# Patient Record
Sex: Female | Born: 1950 | Race: Black or African American | Hispanic: No | Smoking: Never smoker
Health system: Southern US, Community
[De-identification: ages and names within clinical notes are randomized; demographics above are authoritative.]

## PROBLEM LIST (undated history)

## (undated) DIAGNOSIS — I10 Essential (primary) hypertension: Secondary | ICD-10-CM

## (undated) DIAGNOSIS — M4316 Spondylolisthesis, lumbar region: Secondary | ICD-10-CM

## (undated) DIAGNOSIS — E7800 Pure hypercholesterolemia, unspecified: Secondary | ICD-10-CM

## (undated) DIAGNOSIS — T4145XA Adverse effect of unspecified anesthetic, initial encounter: Secondary | ICD-10-CM

## (undated) DIAGNOSIS — T8859XA Other complications of anesthesia, initial encounter: Secondary | ICD-10-CM

## (undated) DIAGNOSIS — M1710 Unilateral primary osteoarthritis, unspecified knee: Secondary | ICD-10-CM

## (undated) DIAGNOSIS — R609 Edema, unspecified: Secondary | ICD-10-CM

## (undated) DIAGNOSIS — M179 Osteoarthritis of knee, unspecified: Secondary | ICD-10-CM

## (undated) DIAGNOSIS — E611 Iron deficiency: Secondary | ICD-10-CM

## (undated) DIAGNOSIS — L910 Hypertrophic scar: Secondary | ICD-10-CM

## (undated) DIAGNOSIS — E039 Hypothyroidism, unspecified: Secondary | ICD-10-CM

## (undated) DIAGNOSIS — E049 Nontoxic goiter, unspecified: Secondary | ICD-10-CM

## (undated) HISTORY — DX: Pure hypercholesterolemia, unspecified: E78.00

## (undated) HISTORY — DX: Edema, unspecified: R60.9

## (undated) HISTORY — DX: Unilateral primary osteoarthritis, unspecified knee: M17.10

## (undated) HISTORY — PX: COLONOSCOPY: SHX174

## (undated) HISTORY — PX: EYE SURGERY: SHX253

## (undated) HISTORY — PX: TONSILLECTOMY: SUR1361

## (undated) HISTORY — DX: Osteoarthritis of knee, unspecified: M17.9

## (undated) HISTORY — DX: Iron deficiency: E61.1

## (undated) HISTORY — DX: Hypertrophic scar: L91.0

## (undated) HISTORY — DX: Nontoxic goiter, unspecified: E04.9

---

## 1997-11-04 HISTORY — PX: THYROID SURGERY: SHX805

## 1998-03-23 ENCOUNTER — Other Ambulatory Visit: Admission: RE | Admit: 1998-03-23 | Discharge: 1998-03-23 | Payer: Self-pay | Admitting: Gynecology

## 1998-10-31 ENCOUNTER — Ambulatory Visit (HOSPITAL_COMMUNITY): Admission: RE | Admit: 1998-10-31 | Discharge: 1998-11-01 | Payer: Self-pay | Admitting: Surgery

## 1999-09-03 ENCOUNTER — Encounter: Admission: RE | Admit: 1999-09-03 | Discharge: 1999-09-03 | Payer: Self-pay | Admitting: Gynecology

## 1999-09-03 ENCOUNTER — Encounter: Payer: Self-pay | Admitting: Gynecology

## 1999-11-15 ENCOUNTER — Other Ambulatory Visit: Admission: RE | Admit: 1999-11-15 | Discharge: 1999-11-15 | Payer: Self-pay | Admitting: Gynecology

## 2000-01-23 ENCOUNTER — Other Ambulatory Visit: Admission: RE | Admit: 2000-01-23 | Discharge: 2000-01-23 | Payer: Self-pay | Admitting: Gynecology

## 2000-12-23 ENCOUNTER — Other Ambulatory Visit: Admission: RE | Admit: 2000-12-23 | Discharge: 2000-12-23 | Payer: Self-pay | Admitting: Gynecology

## 2001-01-16 ENCOUNTER — Other Ambulatory Visit: Admission: RE | Admit: 2001-01-16 | Discharge: 2001-01-16 | Payer: Self-pay | Admitting: Gynecology

## 2001-04-22 ENCOUNTER — Encounter: Admission: RE | Admit: 2001-04-22 | Discharge: 2001-04-22 | Payer: Self-pay | Admitting: Gynecology

## 2001-04-22 ENCOUNTER — Encounter: Payer: Self-pay | Admitting: Gynecology

## 2001-05-20 ENCOUNTER — Other Ambulatory Visit: Admission: RE | Admit: 2001-05-20 | Discharge: 2001-05-20 | Payer: Self-pay | Admitting: Gynecology

## 2002-01-27 ENCOUNTER — Other Ambulatory Visit: Admission: RE | Admit: 2002-01-27 | Discharge: 2002-01-27 | Payer: Self-pay | Admitting: Gynecology

## 2002-07-15 ENCOUNTER — Other Ambulatory Visit: Admission: RE | Admit: 2002-07-15 | Discharge: 2002-07-15 | Payer: Self-pay | Admitting: Gynecology

## 2003-11-18 ENCOUNTER — Encounter: Admission: RE | Admit: 2003-11-18 | Discharge: 2003-11-18 | Payer: Self-pay | Admitting: Endocrinology

## 2003-11-28 ENCOUNTER — Other Ambulatory Visit: Admission: RE | Admit: 2003-11-28 | Discharge: 2003-11-28 | Payer: Self-pay | Admitting: Gynecology

## 2004-12-31 ENCOUNTER — Encounter: Admission: RE | Admit: 2004-12-31 | Discharge: 2004-12-31 | Payer: Self-pay | Admitting: Gynecology

## 2005-03-25 ENCOUNTER — Other Ambulatory Visit: Admission: RE | Admit: 2005-03-25 | Discharge: 2005-03-25 | Payer: Self-pay | Admitting: Gynecology

## 2006-08-25 ENCOUNTER — Encounter: Admission: RE | Admit: 2006-08-25 | Discharge: 2006-08-25 | Payer: Self-pay | Admitting: Gynecology

## 2006-10-08 ENCOUNTER — Other Ambulatory Visit: Admission: RE | Admit: 2006-10-08 | Discharge: 2006-10-08 | Payer: Self-pay | Admitting: Otolaryngology

## 2006-10-15 ENCOUNTER — Other Ambulatory Visit: Admission: RE | Admit: 2006-10-15 | Discharge: 2006-10-15 | Payer: Self-pay | Admitting: Gynecology

## 2007-09-02 ENCOUNTER — Encounter: Admission: RE | Admit: 2007-09-02 | Discharge: 2007-09-02 | Payer: Self-pay | Admitting: Gynecology

## 2008-09-02 ENCOUNTER — Encounter: Admission: RE | Admit: 2008-09-02 | Discharge: 2008-09-02 | Payer: Self-pay | Admitting: Family Medicine

## 2008-09-15 ENCOUNTER — Encounter: Admission: RE | Admit: 2008-09-15 | Discharge: 2008-09-15 | Payer: Self-pay | Admitting: Family Medicine

## 2009-10-03 ENCOUNTER — Encounter: Admission: RE | Admit: 2009-10-03 | Discharge: 2009-10-03 | Payer: Self-pay | Admitting: Family Medicine

## 2010-08-07 ENCOUNTER — Encounter: Admission: RE | Admit: 2010-08-07 | Discharge: 2010-08-07 | Payer: Self-pay | Admitting: Family Medicine

## 2010-10-23 ENCOUNTER — Encounter
Admission: RE | Admit: 2010-10-23 | Discharge: 2010-10-23 | Payer: Self-pay | Source: Home / Self Care | Attending: Family Medicine | Admitting: Family Medicine

## 2010-11-25 ENCOUNTER — Encounter: Payer: Self-pay | Admitting: Family Medicine

## 2011-07-22 ENCOUNTER — Other Ambulatory Visit: Payer: Self-pay | Admitting: Family Medicine

## 2011-07-22 DIAGNOSIS — E049 Nontoxic goiter, unspecified: Secondary | ICD-10-CM

## 2011-07-24 ENCOUNTER — Ambulatory Visit
Admission: RE | Admit: 2011-07-24 | Discharge: 2011-07-24 | Disposition: A | Discharge status: Home / Self Care | Payer: BC Managed Care – PPO | Source: Ambulatory Visit | Attending: Family Medicine | Admitting: Family Medicine

## 2011-07-24 DIAGNOSIS — E049 Nontoxic goiter, unspecified: Secondary | ICD-10-CM

## 2011-10-09 ENCOUNTER — Other Ambulatory Visit: Payer: Self-pay | Admitting: Family Medicine

## 2011-10-09 DIAGNOSIS — Z1231 Encounter for screening mammogram for malignant neoplasm of breast: Secondary | ICD-10-CM

## 2011-10-31 ENCOUNTER — Ambulatory Visit: Payer: BC Managed Care – PPO

## 2012-01-02 ENCOUNTER — Other Ambulatory Visit: Payer: Self-pay | Admitting: Obstetrics and Gynecology

## 2012-01-02 ENCOUNTER — Other Ambulatory Visit (HOSPITAL_COMMUNITY)
Admission: RE | Admit: 2012-01-02 | Discharge: 2012-01-02 | Disposition: A | Discharge status: Home / Self Care | Payer: BC Managed Care – PPO | Source: Ambulatory Visit | Attending: Obstetrics and Gynecology | Admitting: Obstetrics and Gynecology

## 2012-01-02 DIAGNOSIS — Z01419 Encounter for gynecological examination (general) (routine) without abnormal findings: Secondary | ICD-10-CM | POA: Insufficient documentation

## 2012-01-09 ENCOUNTER — Encounter (HOSPITAL_COMMUNITY): Payer: Self-pay

## 2012-01-24 ENCOUNTER — Other Ambulatory Visit (HOSPITAL_COMMUNITY): Payer: BC Managed Care – PPO

## 2012-01-24 ENCOUNTER — Encounter (HOSPITAL_COMMUNITY)
Admission: RE | Admit: 2012-01-24 | Discharge: 2012-01-24 | Disposition: A | Discharge status: Home / Self Care | Payer: BC Managed Care – PPO | Source: Ambulatory Visit | Attending: Obstetrics and Gynecology | Admitting: Obstetrics and Gynecology

## 2012-01-24 ENCOUNTER — Encounter (HOSPITAL_COMMUNITY): Payer: Self-pay

## 2012-01-24 ENCOUNTER — Other Ambulatory Visit: Payer: Self-pay

## 2012-01-24 HISTORY — DX: Hypothyroidism, unspecified: E03.9

## 2012-01-24 HISTORY — DX: Other complications of anesthesia, initial encounter: T88.59XA

## 2012-01-24 HISTORY — DX: Adverse effect of unspecified anesthetic, initial encounter: T41.45XA

## 2012-01-24 HISTORY — DX: Essential (primary) hypertension: I10

## 2012-01-24 LAB — CBC
HCT: 37.7 % (ref 36.0–46.0)
Hemoglobin: 12.5 g/dL (ref 12.0–15.0)
MCHC: 33.2 g/dL (ref 30.0–36.0)
MCV: 84 fL (ref 78.0–100.0)
Platelets: 336 10*3/uL (ref 150–400)
RDW: 16 % — ABNORMAL HIGH (ref 11.5–15.5)
WBC: 7 10*3/uL (ref 4.0–10.5)

## 2012-01-24 LAB — BASIC METABOLIC PANEL
CO2: 28 mEq/L (ref 19–32)
Creatinine, Ser: 0.68 mg/dL (ref 0.50–1.10)
GFR calc Af Amer: >90 mL/min (ref >90)
Potassium: 3 mEq/L — ABNORMAL LOW (ref 3.5–5.1)
Sodium: 137 mEq/L (ref 135–145)

## 2012-01-24 NOTE — Pre-Procedure Instructions (Signed)
Low K+ result of 3.0 reported to Dr. Arby Barrette, who requested I call Dr. Richardson Dopp and have her order K-Dur 40 meq to take daily until surgery. I spoke to Annabelle Harman, Dr. Dawayne Patricia nurse, who said she'd call the order in for Dr. Richardson Dopp to pt's preferred pharmacy. I called the pt, Mrs. Cassidy Austin, and told her about her labs and the need to pick up RX from pharmacy.

## 2012-01-24 NOTE — Patient Instructions (Addendum)
YOUR PROCEDURE IS SCHEDULED ON:01/30/12  ENTER THROUGH THE MAIN ENTRANCE OF [facility] AT:0730  USE DESK PHONE AND DIAL [phone] TO INFORM us OF YOUR ARRIVAL  CALL [phone] IF YOU HAVE ANY QUESTIONS OR PROBLEMS PRIOR TO YOUR ARRIVAL.  REMEMBER: DO NOT EAT OR DRINK AFTER MIDNIGHT : Wed  SPECIAL INSTRUCTIONS:   YOU MAY BRUSH YOUR TEETH THE MORNING OF SURGERY   TAKE THESE MEDICINES THE DAY OF SURGERY WITH SIP OF WATER:Synthroid, BP med  DO NOT WEAR JEWELRY, EYE MAKEUP, LIPSTICK OR DARK FINGERNAIL POLISH DO NOT WEAR LOTIONS  DO NOT SHAVE FOR 48 HOURS PRIOR TO SURGERY  YOU WILL NOT BE ALLOWED TO DRIVE YOURSELF HOME.  NAME OF DRIVER: unknown at PAT appt

## 2012-01-24 NOTE — Pre-Procedure Instructions (Signed)
Per Dr. Arby Barrette- repeat K+ day of surgery... I entered lab request for DOS.

## 2012-01-29 ENCOUNTER — Other Ambulatory Visit: Payer: Self-pay | Admitting: Obstetrics and Gynecology

## 2012-01-30 ENCOUNTER — Encounter (HOSPITAL_COMMUNITY): Payer: Self-pay | Admitting: Anesthesiology

## 2012-01-30 ENCOUNTER — Ambulatory Visit (HOSPITAL_COMMUNITY)
Admission: RE | Admit: 2012-01-30 | Discharge: 2012-01-30 | Disposition: A | Discharge status: Home Health | Payer: BC Managed Care – PPO | Source: Ambulatory Visit | Attending: Obstetrics and Gynecology | Admitting: Obstetrics and Gynecology

## 2012-01-30 ENCOUNTER — Ambulatory Visit (HOSPITAL_COMMUNITY): Payer: BC Managed Care – PPO | Admitting: Anesthesiology

## 2012-01-30 ENCOUNTER — Encounter (HOSPITAL_COMMUNITY)
Admission: RE | Disposition: A | Discharge status: Home Health | Payer: Self-pay | Source: Ambulatory Visit | Attending: Obstetrics and Gynecology

## 2012-01-30 DIAGNOSIS — Z01818 Encounter for other preprocedural examination: Secondary | ICD-10-CM | POA: Insufficient documentation

## 2012-01-30 DIAGNOSIS — Z01812 Encounter for preprocedural laboratory examination: Secondary | ICD-10-CM | POA: Insufficient documentation

## 2012-01-30 DIAGNOSIS — N840 Polyp of corpus uteri: Secondary | ICD-10-CM

## 2012-01-30 DIAGNOSIS — N950 Postmenopausal bleeding: Secondary | ICD-10-CM | POA: Insufficient documentation

## 2012-01-30 LAB — POTASSIUM: Potassium: 4 mEq/L (ref 3.5–5.1)

## 2012-01-30 SURGERY — DILATATION & CURETTAGE/HYSTEROSCOPY WITH RESECTOCOPE
Anesthesia: General | Site: Vagina | Wound class: Clean Contaminated

## 2012-01-30 MED ORDER — DOXYCYCLINE HYCLATE 100 MG PO TABS: ORAL_TABLET | ORAL | Status: DC

## 2012-01-30 MED ORDER — PROPOFOL 10 MG/ML IV EMUL
INTRAVENOUS | Status: AC
  Filled 2012-01-30: qty 20

## 2012-01-30 MED ORDER — EPHEDRINE SULFATE 50 MG/ML IJ SOLN
INTRAMUSCULAR | Status: DC | PRN
  Administered 2012-01-30: 10 mg via INTRAVENOUS
  Administered 2012-01-30 (×2): 5 mg via INTRAVENOUS

## 2012-01-30 MED ORDER — FENTANYL CITRATE 0.05 MG/ML IJ SOLN
INTRAMUSCULAR | Status: DC | PRN
  Administered 2012-01-30 (×3): 50 ug via INTRAVENOUS

## 2012-01-30 MED ORDER — PROPOFOL 10 MG/ML IV EMUL
INTRAVENOUS | Status: DC | PRN
  Administered 2012-01-30: 150 mg via INTRAVENOUS

## 2012-01-30 MED ORDER — FENTANYL CITRATE 0.05 MG/ML IJ SOLN
INTRAMUSCULAR | Status: AC
  Filled 2012-01-30: qty 4

## 2012-01-30 MED ORDER — MIDAZOLAM HCL 5 MG/5ML IJ SOLN
INTRAMUSCULAR | Status: DC | PRN
  Administered 2012-01-30 (×2): 1 mg via INTRAVENOUS

## 2012-01-30 MED ORDER — EPHEDRINE 5 MG/ML INJ
INTRAVENOUS | Status: AC
  Filled 2012-01-30: qty 10

## 2012-01-30 MED ORDER — PHENYLEPHRINE HCL 10 MG/ML IJ SOLN
INTRAMUSCULAR | Status: DC | PRN
  Administered 2012-01-30: 40 ug via INTRAVENOUS
  Administered 2012-01-30 (×3): 80 ug via INTRAVENOUS

## 2012-01-30 MED ORDER — BUPIVACAINE HCL (PF) 0.25 % IJ SOLN
INTRAMUSCULAR | Status: AC
  Filled 2012-01-30: qty 30

## 2012-01-30 MED ORDER — LIDOCAINE HCL (CARDIAC) 20 MG/ML IV SOLN
INTRAVENOUS | Status: DC | PRN
  Administered 2012-01-30: 100 mg via INTRAVENOUS

## 2012-01-30 MED ORDER — IBUPROFEN 200 MG PO TABS: 600.0000 mg | ORAL_TABLET | Freq: Four times a day (QID) | ORAL | Status: AC | PRN

## 2012-01-30 MED ORDER — METOCLOPRAMIDE HCL 5 MG/ML IJ SOLN: 10.0000 mg | Freq: Once | INTRAMUSCULAR | Status: DC | PRN

## 2012-01-30 MED ORDER — LACTATED RINGERS IV SOLN
INTRAVENOUS | Status: DC
  Administered 2012-01-30 (×4): via INTRAVENOUS

## 2012-01-30 MED ORDER — LIDOCAINE HCL (CARDIAC) 20 MG/ML IV SOLN
INTRAVENOUS | Status: AC
  Filled 2012-01-30: qty 5

## 2012-01-30 MED ORDER — GLYCINE 1.5 % IR SOLN
Status: DC | PRN
  Administered 2012-01-30: 3000 mL

## 2012-01-30 MED ORDER — MEPERIDINE HCL 25 MG/ML IJ SOLN: 6.2500 mg | INTRAMUSCULAR | Status: DC | PRN

## 2012-01-30 MED ORDER — DEXAMETHASONE SODIUM PHOSPHATE 10 MG/ML IJ SOLN
INTRAMUSCULAR | Status: AC
  Filled 2012-01-30: qty 1

## 2012-01-30 MED ORDER — BUPIVACAINE HCL (PF) 0.25 % IJ SOLN
INTRAMUSCULAR | Status: DC | PRN
  Administered 2012-01-30: 20 mL

## 2012-01-30 MED ORDER — PHENYLEPHRINE 40 MCG/ML (10ML) SYRINGE FOR IV PUSH (FOR BLOOD PRESSURE SUPPORT)
PREFILLED_SYRINGE | INTRAVENOUS | Status: AC
  Filled 2012-01-30: qty 10

## 2012-01-30 MED ORDER — ONDANSETRON HCL 4 MG/2ML IJ SOLN
INTRAMUSCULAR | Status: AC
  Filled 2012-01-30: qty 2

## 2012-01-30 MED ORDER — ONDANSETRON HCL 4 MG/2ML IJ SOLN
INTRAMUSCULAR | Status: DC | PRN
  Administered 2012-01-30: 4 mg via INTRAVENOUS

## 2012-01-30 MED ORDER — KETOROLAC TROMETHAMINE 60 MG/2ML IM SOLN
INTRAMUSCULAR | Status: AC
  Filled 2012-01-30: qty 2

## 2012-01-30 MED ORDER — KETOROLAC TROMETHAMINE 30 MG/ML IJ SOLN
INTRAMUSCULAR | Status: DC | PRN
  Administered 2012-01-30: 60 mg via INTRAVENOUS

## 2012-01-30 MED ORDER — SILVER NITRATE-POT NITRATE 75-25 % EX MISC
CUTANEOUS | Status: DC | PRN
  Administered 2012-01-30: 1

## 2012-01-30 MED ORDER — KETOROLAC TROMETHAMINE 30 MG/ML IJ SOLN
INTRAMUSCULAR | Status: AC
  Filled 2012-01-30: qty 1

## 2012-01-30 MED ORDER — FENTANYL CITRATE 0.05 MG/ML IJ SOLN: 25.0000 ug | INTRAMUSCULAR | Status: DC | PRN

## 2012-01-30 MED ORDER — DEXAMETHASONE SODIUM PHOSPHATE 4 MG/ML IJ SOLN
INTRAMUSCULAR | Status: DC | PRN
  Administered 2012-01-30: 10 mg via INTRAVENOUS

## 2012-01-30 MED ORDER — HYDROCODONE-ACETAMINOPHEN 5-500 MG PO TABS: ORAL_TABLET | ORAL | Status: DC

## 2012-01-30 MED ORDER — MIDAZOLAM HCL 2 MG/2ML IJ SOLN
INTRAMUSCULAR | Status: AC
  Filled 2012-01-30: qty 2

## 2012-01-30 SURGICAL SUPPLY — 21 items
CANISTER SUCTION 2500CC (MISCELLANEOUS) ×2 IMPLANT
CATH ROBINSON RED A/P 16FR (CATHETERS) ×2 IMPLANT
CLOTH BEACON ORANGE TIMEOUT ST (SAFETY) ×2 IMPLANT
CONTAINER PREFILL 10% NBF 60ML (FORM) ×3 IMPLANT
ELECT REM PT RETURN 9FT ADLT (ELECTROSURGICAL) ×2
ELECTRODE REM PT RTRN 9FT ADLT (ELECTROSURGICAL) ×1 IMPLANT
GLOVE BIOGEL M 6.5 STRL (GLOVE) ×2 IMPLANT
GLOVE BIOGEL PI IND STRL 6 (GLOVE) IMPLANT
GLOVE BIOGEL PI IND STRL 6.5 (GLOVE) ×2 IMPLANT
GLOVE BIOGEL PI INDICATOR 6 (GLOVE) ×1
GLOVE BIOGEL PI INDICATOR 6.5 (GLOVE) ×2
GLOVE INDICATOR 7.0 STRL GRN (GLOVE) ×1 IMPLANT
GLOVE NEODERM STER SZ 7 (GLOVE) ×1 IMPLANT
GLOVE SURG SS PI 6.0 STRL IVOR (GLOVE) ×1 IMPLANT
GOWN PREVENTION PLUS LG XLONG (DISPOSABLE) ×2 IMPLANT
GOWN PREVENTION PLUS XLARGE (GOWN DISPOSABLE) ×2 IMPLANT
GOWN STRL REIN XL XLG (GOWN DISPOSABLE) ×2 IMPLANT
LOOP ANGLED CUTTING 22FR (CUTTING LOOP) IMPLANT
PACK HYSTEROSCOPY LF (CUSTOM PROCEDURE TRAY) ×2 IMPLANT
TOWEL OR 17X24 6PK STRL BLUE (TOWEL DISPOSABLE) ×4 IMPLANT
WATER STERILE IRR 1000ML POUR (IV SOLUTION) ×2 IMPLANT

## 2012-01-30 NOTE — Transfer of Care (Signed)
Immediate Anesthesia Transfer of Care Note  Patient: Cassidy Austin  Procedure(s) Performed: Procedure(s) (LRB): DILATATION & CURETTAGE/HYSTEROSCOPY WITH RESECTOCOPE (N/A)  Patient Location: PACU  Anesthesia Type: General  Level of Consciousness: awake, alert , oriented and patient cooperative  Airway & Oxygen Therapy: Patient Spontanous Breathing and Patient connected to nasal cannula oxygen  Post-op Assessment: Report given to PACU RN and Post -op Vital signs reviewed and stable  Post vital signs: Reviewed and stable  Complications: No apparent anesthesia complications

## 2012-01-30 NOTE — Anesthesia Preprocedure Evaluation (Signed)
Anesthesia Evaluation  Patient identified by MRN, date of birth, ID band Patient awake    Reviewed: Allergy & Precautions, H&P , NPO status , Patient's Chart, lab work & pertinent test results  Airway Mallampati: II TM Distance: >3 FB Neck ROM: Full    Dental No notable dental hx. (+) Teeth Intact   Pulmonary neg pulmonary ROS,  breath sounds clear to auscultation  Pulmonary exam normal       Cardiovascular hypertension, Pt. on medications Rhythm:Regular Rate:Normal     Neuro/Psych negative neurological ROS  negative psych ROS   GI/Hepatic negative GI ROS, Neg liver ROS,   Endo/Other  Hypothyroidism Morbid obesity  Renal/GU negative Renal ROS  negative genitourinary   Musculoskeletal negative musculoskeletal ROS (+)   Abdominal Normal abdominal exam  (+)   Peds  Hematology negative hematology ROS (+)   Anesthesia Other Findings   Reproductive/Obstetrics negative OB ROS                           Anesthesia Physical Anesthesia Plan  ASA: III  Anesthesia Plan: General   Post-op Pain Management:    Induction: Intravenous  Airway Management Planned: LMA  Additional Equipment:   Intra-op Plan:   Post-operative Plan: Extubation in OR  Informed Consent: I have reviewed the patients History and Physical, chart, labs and discussed the procedure including the risks, benefits and alternatives for the proposed anesthesia with the patient or authorized representative who has indicated his/her understanding and acceptance.   Dental advisory given  Plan Discussed with: CRNA, Anesthesiologist and Surgeon  Anesthesia Plan Comments:         Anesthesia Quick Evaluation

## 2012-01-30 NOTE — Preoperative (Signed)
Beta Blockers   Reason not to administer Beta Blockers:Not Applicable 

## 2012-01-30 NOTE — Discharge Instructions (Signed)

## 2012-01-30 NOTE — Anesthesia Postprocedure Evaluation (Signed)
  Anesthesia Post-op Note  Patient: Cassidy Austin  Procedure(s) Performed: Procedure(s) (LRB): DILATATION & CURETTAGE/HYSTEROSCOPY WITH RESECTOCOPE (N/A)  Patient Location: PACU  Anesthesia Type: General  Level of Consciousness: awake, alert  and oriented  Airway and Oxygen Therapy: Patient Spontanous Breathing  Post-op Pain: none  Post-op Assessment: Post-op Vital signs reviewed, Patient's Cardiovascular Status Stable, Respiratory Function Stable, Patent Airway, No signs of Nausea or vomiting and Pain level controlled  Post-op Vital Signs: Reviewed and stable  Complications: No apparent anesthesia complications

## 2012-01-30 NOTE — Op Note (Signed)
01/30/2012  10:08 AM  PATIENT:  Cassidy Austin  61 y.o. female  PRE-OPERATIVE DIAGNOSIS:  Postmenopausal bleeding   POST-OPERATIVE DIAGNOSIS:  Postmenopausal bleeding  PROCEDURE:  Procedure(s) (LRB): DILATATION & CURETTAGE/HYSTEROSCOPY WITH Polypectomy   SURGEON:  Surgeon(s) and Role:    * Dorien Chihuahua. Richardson Dopp, MD - Primary  PHYSICIAN ASSISTANT:   ASSISTANTS: none   ANESTHESIA:   general  EBL:  Total I/O In: 1550 [I.V.:1550] Out: 35 [Urine:20; Blood:15]  BLOOD ADMINISTERED:none  DRAINS: none   LOCAL MEDICATIONS USED:  MARCAINE     SPECIMEN:  Source of Specimen:  endometrial currettings and polyp   DISPOSITION OF SPECIMEN:  PATHOLOGY  COUNTS:  YES  TOURNIQUET:  * No tourniquets in log *  DICTATION: .Dragon Dictation  PLAN OF CARE: Discharge to home after PACU  PATIENT DISPOSITION:  PACU - hemodynamically stable.   Delay start of Pharmacological VTE agent (>24hrs) due to surgical blood loss or risk of bleeding: not applicable  Procedure: Taken to the operating room where she was identified as Cassidy Austin. She was placed under general anesthesia. She was placed in dorsal lithotomy position. She was  prepped and draped in the usual sterile fashion. A speculum was placed into the vaginal vault. The anterior lip of the cervix grasped with single-tooth tenaculum. Cervix was sounded to 6 cm. Cervix was dilated to approximately 4 mm. Hysteroscope was inserted patient was noted to have an endometrial polyp. Hysteroscope was removed. Polyp forceps were introduced into the uterine cavity and the polyp was removed without difficulty. Sharp curette was then introduced and curettage was performed. Hysteroscope was then reintroduced the polyp was no  longer present. there was no evidence of uterine perforation. Hysteroscope was removed. 10 cc of quarter percent Marcaine were injected at the Four and Eight clock positions of the cervix . The single-tooth tenaculum was removed. Patient  was noted to have have bleeding from the left tenaculum site. Silver nitrate was applied to the tenaculum site Hemostasis was noted. Sponge lap and needle counts were correct x2. Patient was awakened from anesthesia taken to the recovery room awake and in stable condition.  Glycine deficit 70 cc.  Estimated blood loss 15 cc.  IV fluids per anesthesia.

## 2012-01-30 NOTE — Op Note (Addendum)
Date of Initial H&P: 01/02/2012 History reviewed, patient examined,   Pt started amilodipine 5 mg 01/09/2012 She is also on Diovan HCT Pt is stable for surgery.   This should be labeled H&P update not op note

## 2012-08-11 ENCOUNTER — Ambulatory Visit
Admission: RE | Admit: 2012-08-11 | Discharge: 2012-08-11 | Disposition: A | Discharge status: Home / Self Care | Payer: BC Managed Care – PPO | Source: Ambulatory Visit | Attending: Family Medicine | Admitting: Family Medicine

## 2012-08-11 DIAGNOSIS — Z1231 Encounter for screening mammogram for malignant neoplasm of breast: Secondary | ICD-10-CM

## 2013-03-19 ENCOUNTER — Other Ambulatory Visit: Payer: Self-pay | Admitting: Obstetrics and Gynecology

## 2013-03-19 ENCOUNTER — Other Ambulatory Visit (HOSPITAL_COMMUNITY)
Admission: RE | Admit: 2013-03-19 | Discharge: 2013-03-19 | Disposition: A | Discharge status: Home / Self Care | Payer: BC Managed Care – PPO | Source: Ambulatory Visit | Attending: Obstetrics and Gynecology | Admitting: Obstetrics and Gynecology

## 2013-03-19 DIAGNOSIS — Z1151 Encounter for screening for human papillomavirus (HPV): Secondary | ICD-10-CM | POA: Insufficient documentation

## 2013-03-19 DIAGNOSIS — Z01419 Encounter for gynecological examination (general) (routine) without abnormal findings: Secondary | ICD-10-CM | POA: Insufficient documentation

## 2013-07-13 ENCOUNTER — Other Ambulatory Visit: Payer: Self-pay

## 2013-07-13 DIAGNOSIS — Z1231 Encounter for screening mammogram for malignant neoplasm of breast: Secondary | ICD-10-CM

## 2013-07-29 ENCOUNTER — Ambulatory Visit
Admission: RE | Admit: 2013-07-29 | Discharge: 2013-07-29 | Disposition: A | Discharge status: Home / Self Care | Payer: BC Managed Care – PPO | Source: Ambulatory Visit | Attending: Family Medicine | Admitting: Family Medicine

## 2013-07-29 ENCOUNTER — Other Ambulatory Visit: Payer: Self-pay | Admitting: Family Medicine

## 2013-07-29 DIAGNOSIS — M25562 Pain in left knee: Secondary | ICD-10-CM

## 2013-07-29 DIAGNOSIS — M25561 Pain in right knee: Secondary | ICD-10-CM

## 2013-08-13 ENCOUNTER — Ambulatory Visit
Admission: RE | Admit: 2013-08-13 | Discharge: 2013-08-13 | Disposition: A | Discharge status: Home / Self Care | Payer: BC Managed Care – PPO | Source: Ambulatory Visit

## 2013-08-13 DIAGNOSIS — Z1231 Encounter for screening mammogram for malignant neoplasm of breast: Secondary | ICD-10-CM

## 2014-04-06 ENCOUNTER — Other Ambulatory Visit: Payer: Self-pay | Admitting: Obstetrics and Gynecology

## 2014-04-06 ENCOUNTER — Other Ambulatory Visit (HOSPITAL_COMMUNITY)
Admission: RE | Admit: 2014-04-06 | Discharge: 2014-04-06 | Disposition: A | Discharge status: Home / Self Care | Payer: BC Managed Care – PPO | Source: Ambulatory Visit | Attending: Obstetrics and Gynecology | Admitting: Obstetrics and Gynecology

## 2014-04-06 DIAGNOSIS — Z01419 Encounter for gynecological examination (general) (routine) without abnormal findings: Secondary | ICD-10-CM | POA: Insufficient documentation

## 2014-04-11 LAB — CYTOLOGY - PAP

## 2014-07-12 ENCOUNTER — Other Ambulatory Visit: Payer: Self-pay

## 2014-07-12 DIAGNOSIS — Z1231 Encounter for screening mammogram for malignant neoplasm of breast: Secondary | ICD-10-CM

## 2014-08-16 ENCOUNTER — Ambulatory Visit
Admission: RE | Admit: 2014-08-16 | Discharge: 2014-08-16 | Disposition: A | Discharge status: Home / Self Care | Payer: BC Managed Care – PPO | Source: Ambulatory Visit

## 2014-08-16 ENCOUNTER — Encounter (INDEPENDENT_AMBULATORY_CARE_PROVIDER_SITE_OTHER): Payer: Self-pay

## 2014-08-16 DIAGNOSIS — Z1231 Encounter for screening mammogram for malignant neoplasm of breast: Secondary | ICD-10-CM

## 2014-09-06 ENCOUNTER — Other Ambulatory Visit: Payer: Self-pay | Admitting: Family Medicine

## 2014-09-06 DIAGNOSIS — E049 Nontoxic goiter, unspecified: Secondary | ICD-10-CM

## 2014-09-07 ENCOUNTER — Other Ambulatory Visit: Payer: BC Managed Care – PPO

## 2014-09-13 ENCOUNTER — Ambulatory Visit
Admission: RE | Admit: 2014-09-13 | Discharge: 2014-09-13 | Disposition: A | Discharge status: Home / Self Care | Payer: BC Managed Care – PPO | Source: Ambulatory Visit | Attending: Family Medicine | Admitting: Family Medicine

## 2014-09-13 DIAGNOSIS — E049 Nontoxic goiter, unspecified: Secondary | ICD-10-CM

## 2014-09-16 ENCOUNTER — Other Ambulatory Visit: Payer: Self-pay | Admitting: Family Medicine

## 2014-09-16 DIAGNOSIS — E041 Nontoxic single thyroid nodule: Secondary | ICD-10-CM

## 2014-10-05 ENCOUNTER — Other Ambulatory Visit (HOSPITAL_COMMUNITY)
Admission: RE | Admit: 2014-10-05 | Discharge: 2014-10-05 | Disposition: A | Discharge status: Home / Self Care | Payer: BC Managed Care – PPO | Source: Ambulatory Visit | Attending: Interventional Radiology | Admitting: Interventional Radiology

## 2014-10-05 ENCOUNTER — Ambulatory Visit
Admission: RE | Admit: 2014-10-05 | Discharge: 2014-10-05 | Disposition: A | Discharge status: Home / Self Care | Payer: BC Managed Care – PPO | Source: Ambulatory Visit | Attending: Family Medicine | Admitting: Family Medicine

## 2014-10-05 DIAGNOSIS — E041 Nontoxic single thyroid nodule: Secondary | ICD-10-CM

## 2015-07-13 ENCOUNTER — Other Ambulatory Visit: Payer: Self-pay

## 2015-07-13 DIAGNOSIS — Z1231 Encounter for screening mammogram for malignant neoplasm of breast: Secondary | ICD-10-CM

## 2015-08-18 ENCOUNTER — Ambulatory Visit
Admission: RE | Admit: 2015-08-18 | Discharge: 2015-08-18 | Disposition: A | Discharge status: Home / Self Care | Payer: Medicare PPO | Source: Ambulatory Visit

## 2015-08-18 DIAGNOSIS — Z1231 Encounter for screening mammogram for malignant neoplasm of breast: Secondary | ICD-10-CM

## 2015-08-21 ENCOUNTER — Other Ambulatory Visit: Payer: Self-pay | Admitting: Family Medicine

## 2015-08-21 DIAGNOSIS — R928 Other abnormal and inconclusive findings on diagnostic imaging of breast: Secondary | ICD-10-CM

## 2015-08-30 ENCOUNTER — Other Ambulatory Visit: Payer: Medicare PPO

## 2015-09-01 ENCOUNTER — Ambulatory Visit
Admission: RE | Admit: 2015-09-01 | Discharge: 2015-09-01 | Disposition: A | Discharge status: Home / Self Care | Payer: Medicare PPO | Source: Ambulatory Visit | Attending: Family Medicine | Admitting: Family Medicine

## 2015-09-01 DIAGNOSIS — R928 Other abnormal and inconclusive findings on diagnostic imaging of breast: Secondary | ICD-10-CM

## 2016-02-26 ENCOUNTER — Other Ambulatory Visit (HOSPITAL_COMMUNITY)
Admission: RE | Admit: 2016-02-26 | Discharge: 2016-02-26 | Disposition: A | Discharge status: Home / Self Care | Payer: Medicare Other | Source: Ambulatory Visit | Attending: Obstetrics and Gynecology | Admitting: Obstetrics and Gynecology

## 2016-02-26 ENCOUNTER — Other Ambulatory Visit: Payer: Self-pay | Admitting: Obstetrics and Gynecology

## 2016-02-26 DIAGNOSIS — Z01419 Encounter for gynecological examination (general) (routine) without abnormal findings: Secondary | ICD-10-CM | POA: Insufficient documentation

## 2016-02-26 DIAGNOSIS — Z1151 Encounter for screening for human papillomavirus (HPV): Secondary | ICD-10-CM | POA: Insufficient documentation

## 2016-02-27 LAB — CYTOLOGY - PAP

## 2016-04-08 ENCOUNTER — Other Ambulatory Visit: Payer: Self-pay | Admitting: Obstetrics and Gynecology

## 2016-07-29 ENCOUNTER — Other Ambulatory Visit: Payer: Self-pay | Admitting: Obstetrics and Gynecology

## 2016-07-29 DIAGNOSIS — Z1231 Encounter for screening mammogram for malignant neoplasm of breast: Secondary | ICD-10-CM

## 2016-09-02 ENCOUNTER — Ambulatory Visit: Payer: Medicare PPO

## 2016-09-02 ENCOUNTER — Ambulatory Visit
Admission: RE | Admit: 2016-09-02 | Discharge: 2016-09-02 | Disposition: A | Discharge status: Home / Self Care | Payer: Medicare Other | Source: Ambulatory Visit | Attending: Obstetrics and Gynecology | Admitting: Obstetrics and Gynecology

## 2016-09-02 DIAGNOSIS — Z1231 Encounter for screening mammogram for malignant neoplasm of breast: Secondary | ICD-10-CM

## 2017-08-06 ENCOUNTER — Other Ambulatory Visit: Payer: Self-pay | Admitting: Obstetrics and Gynecology

## 2017-08-06 DIAGNOSIS — Z1231 Encounter for screening mammogram for malignant neoplasm of breast: Secondary | ICD-10-CM

## 2017-09-04 ENCOUNTER — Ambulatory Visit: Payer: Medicare Other

## 2017-10-21 ENCOUNTER — Telehealth: Payer: Self-pay

## 2017-10-21 ENCOUNTER — Other Ambulatory Visit: Payer: Self-pay | Admitting: Family Medicine

## 2017-10-21 DIAGNOSIS — E049 Nontoxic goiter, unspecified: Secondary | ICD-10-CM

## 2017-10-21 NOTE — Telephone Encounter (Signed)
SENT NOTES TO SCHEDULING 

## 2017-10-29 ENCOUNTER — Ambulatory Visit
Admission: RE | Admit: 2017-10-29 | Discharge: 2017-10-29 | Disposition: A | Discharge status: Home / Self Care | Payer: Medicare Other | Source: Ambulatory Visit | Attending: Family Medicine | Admitting: Family Medicine

## 2017-10-29 DIAGNOSIS — E049 Nontoxic goiter, unspecified: Secondary | ICD-10-CM

## 2017-11-24 DIAGNOSIS — M1710 Unilateral primary osteoarthritis, unspecified knee: Secondary | ICD-10-CM | POA: Insufficient documentation

## 2017-11-24 DIAGNOSIS — T4145XA Adverse effect of unspecified anesthetic, initial encounter: Secondary | ICD-10-CM | POA: Insufficient documentation

## 2017-11-24 DIAGNOSIS — E7800 Pure hypercholesterolemia, unspecified: Secondary | ICD-10-CM | POA: Insufficient documentation

## 2017-11-24 DIAGNOSIS — T8859XA Other complications of anesthesia, initial encounter: Secondary | ICD-10-CM | POA: Insufficient documentation

## 2017-11-24 DIAGNOSIS — M179 Osteoarthritis of knee, unspecified: Secondary | ICD-10-CM | POA: Insufficient documentation

## 2017-11-24 DIAGNOSIS — E039 Hypothyroidism, unspecified: Secondary | ICD-10-CM | POA: Insufficient documentation

## 2017-11-24 DIAGNOSIS — E049 Nontoxic goiter, unspecified: Secondary | ICD-10-CM | POA: Insufficient documentation

## 2017-11-24 DIAGNOSIS — I10 Essential (primary) hypertension: Secondary | ICD-10-CM | POA: Insufficient documentation

## 2017-11-25 ENCOUNTER — Encounter: Payer: Self-pay | Admitting: Interventional Cardiology

## 2017-11-25 ENCOUNTER — Ambulatory Visit: Payer: Medicare Other | Admitting: Interventional Cardiology

## 2017-11-25 VITALS — BP 170/102 | HR 66 | Ht 66.0 in | Wt 297.8 lb

## 2017-11-25 DIAGNOSIS — I10 Essential (primary) hypertension: Secondary | ICD-10-CM

## 2017-11-25 DIAGNOSIS — E7800 Pure hypercholesterolemia, unspecified: Secondary | ICD-10-CM

## 2017-11-25 DIAGNOSIS — R0609 Other forms of dyspnea: Secondary | ICD-10-CM

## 2017-11-25 NOTE — Progress Notes (Signed)
Cardiology Office Note   Date:  11/25/2017   ID:  Emmelina Mcloughlin, DOB [birth year], MRN [id number]  PCP:  Laurann Montana, MD    No chief complaint on file.  Shortness of breath  Wt Readings from Last 3 Encounters:  11/25/17 297 lb 12.8 oz (135.1 kg)  01/24/12 278 lb (126.1 kg)       History of Present Illness: Cassidy Austin is a 67 y.o. female who is being seen today for the evaluation of Shortness of breath at the request of Laurann Montana, MD.  She has had SHOB/DOE over the last 3-4 years.  It has gotten worse with weight gain over the past few years.  She wants to start Silver Sneakers to help get the weight off.  SH ehas gained 40 lbs in the past few years.  DOE starts after 15 minutes of walking.  WOrse with going up a hill.  No chest pain in the past.  Not consistently walking at this time.  Hoping to get back into that.   She had a treadmill test many years ago and that result was ok.    No bleeding problems recently, non after vaginal polyps removed.    Mother had CHF.    No one in the immediate family with stents placed.    No smoking.    She is in the process of moving.  She is ok with that activity. She has to stop and rest.      Past Medical History:  Diagnosis Date  . Complication of anesthesia    hard to wake up after eye surgery in 1970s  . Goiter    with thyroid nodule, FNA 12/15 benign follicular nodule  . Hypercholesterolemia   . Hypertension   . Hypothyroidism   . OA (osteoarthritis) of knee    mod.    Past Surgical History:  Procedure Laterality Date  . COLONOSCOPY    . EYE SURGERY    . THYROID SURGERY  1999     Current Outpatient Medications  Medication Sig Dispense Refill  . aspirin 81 MG chewable tablet Chew 81 mg by mouth every morning.    . Calcium Carbonate (CALCIUM 500 PO) Take 1,000 mg by mouth 2 (two) times daily.    . carvedilol (COREG) 6.25 MG tablet Take 6.25 mg by mouth 2 (two) times daily.  5  . cholecalciferol  (VITAMIN D) 1000 UNITS tablet Take 1,000 Units by mouth 2 (two) times daily.    . ferrous sulfate 325 (65 FE) MG tablet Take 325 mg by mouth daily.  5  . fish oil-omega-3 fatty acids 1000 MG capsule Take 1 g by mouth every morning.    . furosemide (LASIX) 40 MG tablet Take 40 mg by mouth daily.    . Garlic (GARLIQUE) 400 MG TBEC Take 1 tablet by mouth every morning.    Marland Kitchen HYDROcodone-acetaminophen (VICODIN) 5-500 MG per tablet 1-2 po q 6 hrs prn 12 tablet 0  . KLOR-CON M20 20 MEQ tablet Take 20 mEq by mouth daily.  5  . levothyroxine (SYNTHROID, LEVOTHROID) 100 MCG tablet Take 100 mcg by mouth daily at 6 (six) AM.    . Magnesium 250 MG TABS Take 500 mg by mouth every morning.    . naproxen (NAPROSYN) 500 MG tablet Take 1 tablet by mouth 2 (two) times daily as needed.    Marland Kitchen olmesartan-hydrochlorothiazide (BENICAR HCT) 40-25 MG tablet Take 1 tablet by mouth daily.  1  . Red Yeast Rice  Extract (RED YEAST RICE PO) Take 1 tablet by mouth 2 (two) times daily.    Marland Kitchen. tetrahydrozoline 0.05 % ophthalmic solution Apply 1 drop to eye as needed. Visine for dry eyes    . TURMERIC PO Take 2 tablets by mouth daily.    . valsartan-hydrochlorothiazide (DIOVAN-HCT) 320-25 MG per tablet Take 1 tablet by mouth every morning.    . vitamin B-12 (CYANOCOBALAMIN) 1000 MCG tablet Take 2,000 mcg by mouth every morning.    . vitamin C (ASCORBIC ACID) 500 MG tablet Take 500 mg by mouth daily.     No current facility-administered medications for this visit.     Allergies:   Patient has no known allergies.    Social History:  The patient  reports that  has never smoked. she has never used smokeless tobacco. She reports that she does not drink alcohol or use drugs.   Family History:  The patient's family history includes Heart failure in her mother.    ROS:  Please see the history of present illness.   Otherwise, review of systems are positive for DOE.   All other systems are reviewed and negative.    PHYSICAL  EXAM: VS:  BP (!) 170/102 (BP Location: Right Arm, Patient Position: Sitting, Cuff Size: Large)   Pulse 66   Ht 5\' 6"  (1.676 m)   Wt 297 lb 12.8 oz (135.1 kg)   SpO2 96%   BMI 48.07 kg/m  , BMI Body mass index is 48.07 kg/m. GEN: Well nourished, well developed, in no acute distress  HEENT: normal  Neck: no JVD, carotid bruits, or masses Cardiac: RRR; no murmurs, rubs, or gallops,no edema  Respiratory:  clear to auscultation bilaterally, normal work of breathing GI: soft, nontender, nondistended, + BS, obese MS: no deformity or atrophy  Skin: warm and dry, no rash Neuro:  Strength and sensation are intact Psych: euthymic mood, full affect   EKG:   The ekg ordered today demonstrates sinus rhythm, no ST segment changes   Recent Labs: No results found for requested labs within last 8760 hours.   Lipid Panel No results found for: CHOL, TRIG, HDL, CHOLHDL, VLDL, LDLCALC, LDLDIRECT   Other studies Reviewed: Additional studies/ records that were reviewed today with results demonstrating: .LDL 196   ASSESSMENT AND PLAN:  1. DOE: Likely related to deconditioning.  No evidence of structural heart disease by exam or ECG.  I encouraged her to try to exercise more.  She should start slowly and gradually increase the duration of her exercise.  She may need to start with walking 10 minutes at a time.  Hopefully, she can do this several times during her workout with some rest in between.  With further exercise, she should be able to walk with less breaks and she should be able to increase, the duration of her walking. 2. Obesity: We talked about dietary changes to lose weight.  She needs to limit sugar intake.  3. HTN: COntinue current meds.  It should improve with weight loss and exercise.  Elevated reading today.  Monitor outside of the doctor's office as well.  Readings were better controlled at her primary care doctor's office. 4. Hyperlipidemia: I agree with starting a statin.  Dr. Cliffton AstersWhite  wanted to start Crestor.  I think this would be more effective than red yeast rice.  Hopefully, this will also improve with more exercise and diet control.   Current medicines are reviewed at length with the patient today.  The patient concerns  regarding her medicines were addressed.  The following changes have been made:  No change  Labs/ tests ordered today include:   Orders Placed This Encounter  Procedures  . EKG 12-Lead    Recommend 150 minutes/week of aerobic exercise Low fat, low carb, high fiber diet recommended  Disposition:   FU as needed   Signed, Lance Muss, MD  11/25/2017 12:29 PM    [facility] [address] [place], [place], [place]  [address] Phone: [phone]; Fax: [phone]

## 2017-11-25 NOTE — Patient Instructions (Signed)
Medication Instructions:  Your physician recommends that you continue on your current medications as directed. Please refer to the Current Medication list given to you today.   Labwork: None ordered  Testing/Procedures: None ordered  Follow-Up: Your physician wants you to follow-up AS NEEDED   Any Other Special Instructions Will Be Listed Below (If Applicable).   How to Increase Your Level of Physical Activity Getting regular physical activity is important for your overall health and well-being. Most people do not get enough exercise. There are easy ways to increase your level of physical activity, even if you have not been very active in the past or you are just starting out. Why is physical activity important? Physical activity has many short-term and long-term health benefits. Regular exercise can:  Help you lose weight or maintain a healthy weight.  Strengthen your muscles and bones.  Boost your mood and improve self-esteem.  Reduce your risk of certain long-term (chronic) diseases, like heart disease, cancer, and diabetes.  Help you stay capable of walking and moving around (mobile) as you age.  Prevent accidents, such as falls, as you age.  Increase life expectancy.  What are the benefits of being physically active on a regular basis? In addition to improving your physical health, being physically active on most days of the week can help you in ways that you may not expect. Benefits of regular physical activity may include:  Feeling good about your body.  Being able to move around more easily and for longer periods of time without getting tired (increased stamina).  Finding new sources of fun and enjoyment.  Meeting new people who share a common interest.  Being able to fight off illness better (enhanced immunity).  Being able to sleep better.  What can happen if I am not physically active on a regular basis? Not getting enough physical activity can lead to  an unhealthy lifestyle and future health problems. This can increase your chances of:  Becoming overweight or obese.  Becoming sick.  Developing chronic illnesses, like heart disease or diabetes.  Having mental health problems, like depression or anxiety.  Having sleep problems.  Having trouble walking or getting yourself around (reduced mobility).  Injuring yourself in a fall as you get older.  What steps can I take to be more physically active?  Check with your health care provider about how to get started. Ask your health care provider what activities are safe for you.  Start out slowly. Walking or doing some simple chair exercises is a good place to start, especially if you have not been active before or for a long time.  Try to find activities that you enjoy. You are more likely to commit to an exercise routine if it does not feel like a chore.  If you have bone or joint problems, choose low-impact exercises, like walking or swimming.  Include physical activity in your everyday routine.  Invite friends or family members to exercise with you. This also will help you commit to your workout plan.  Set goals that you can work toward.  Aim for at least 150 minutes of moderate-intensity exercise each week. Examples of moderate-intensity exercise include walking or riding a bike. Where to find more information:  Centers for Disease Control and Prevention: JokeRule.co.uk  President's [employer] on [employer] Nutrition www.http://smith-Popoff.com/  ChooseMyPlate: MissedFlights.com.br Contact a health care provider if:  You have headaches, muscle aches, or joint pain.  You feel dizzy or light-headed while exercising.  You faint.  You have chest pain while exercising. Summary  Exercise benefits your mind and body at any age, even if you are just starting out.  If you have a chronic illness or have not been active for a  while, check with your health care provider before increasing your physical activity.  Choose activities that are safe and enjoyable for you.Ask your health care provider what activities are safe for you.  Start slowly. Tell your health care provider if you have problems as you start to increase your activity level. This information is not intended to replace advice given to you by your health care provider. Make sure you discuss any questions you have with your health care provider. Document Released: 10/10/2016 Document Revised: 10/10/2016 Document Reviewed: 10/10/2016 Elsevier Interactive Patient Education  2018 [employer].   Exercising to [employer] Exercising can help you to lose weight. In order to lose weight through exercise, you need to do vigorous-intensity exercise. You can tell that you are exercising with vigorous intensity if you are breathing very hard and fast and cannot hold a conversation while exercising. Moderate-intensity exercise helps to maintain your current weight. You can tell that you are exercising at a moderate level if you have a higher heart rate and faster breathing, but you are still able to hold a conversation. How often should I exercise? Choose an activity that you enjoy and set realistic goals. Your health care provider can help you to make an activity plan that works for you. Exercise regularly as directed by your health care provider. This may include:  Doing resistance training twice each week, such as: ? Push-ups. ? Sit-ups. ? Lifting weights. ? Using resistance bands.  Doing a given intensity of exercise for a given amount of time. Choose from these options: ? 150 minutes of moderate-intensity exercise every week. ? 75 minutes of vigorous-intensity exercise every week. ? A mix of moderate-intensity and vigorous-intensity exercise every week.  Children, pregnant women, people who are out of shape, people who are overweight, and older adults may  need to consult a health care provider for individual recommendations. If you have any sort of medical condition, be sure to consult your health care provider before starting a new exercise program. What are some activities that can help me to lose weight?  Walking at a rate of at least 4.5 miles an hour.  Jogging or running at a rate of 5 miles per hour.  Biking at a rate of at least 10 miles per hour.  Lap swimming.  Roller-skating or in-line skating.  Cross-country skiing.  Vigorous competitive sports, such as football, basketball, and soccer.  Jumping rope.  Aerobic dancing. How can I be more active in my day-to-day activities?  Use the stairs instead of the elevator.  Take a walk during your lunch break.  If you drive, park your car farther away from work or school.  If you take public transportation, get off one stop early and walk the rest of the way.  Make all of your phone calls while standing up and walking around.  Get up, stretch, and walk around every 30 minutes throughout the day. What guidelines should I follow while exercising?  Do not exercise so much that you hurt yourself, feel dizzy, or get very short of breath.  Consult your health care provider prior to starting a new exercise program.  Wear comfortable clothes and shoes with good support.  Drink plenty of water while you exercise to prevent dehydration or heat  stroke. Body water is lost during exercise and must be replaced.  Work out until you breathe faster and your heart beats faster. This information is not intended to replace advice given to you by your health care provider. Make sure you discuss any questions you have with your health care provider. Document Released: 11/23/2010 Document Revised: 03/28/2016 Document Reviewed: 03/24/2014 Elsevier Interactive Patient Education  2018 [employer]   Heart-Healthy Eating Plan Heart-healthy meal planning includes:  Limiting unhealthy  fats.  Increasing healthy fats.  Making other small dietary changes.  You may need to talk with your doctor or a diet specialist (dietitian) to create an eating plan that is right for you. What types of fat should I choose?  Choose healthy fats. These include olive oil and canola oil, flaxseeds, walnuts, almonds, and seeds.  Eat more omega-3 fats. These include salmon, mackerel, sardines, tuna, flaxseed oil, and ground flaxseeds. Try to eat fish at least twice each week.  Limit saturated fats. ? Saturated fats are often found in animal products, such as meats, butter, and cream. ? Plant sources of saturated fats include palm oil, palm kernel oil, and coconut oil.  Avoid foods with partially hydrogenated oils in them. These include stick margarine, some tub margarines, cookies, crackers, and other baked goods. These contain trans fats. What general guidelines do I need to follow?  Check food labels carefully. Identify foods with trans fats or high amounts of saturated fat.  Fill one half of your plate with vegetables and green salads. Eat 4-5 servings of vegetables per day. A serving of vegetables is: ? 1 cup of raw leafy vegetables. ?  cup of raw or cooked cut-up vegetables. ?  cup of vegetable juice.  Fill one fourth of your plate with whole grains. Look for the word "whole" as the first word in the ingredient list.  Fill one fourth of your plate with lean protein foods.  Eat 4-5 servings of fruit per day. A serving of fruit is: ? One medium whole fruit. ?  cup of dried fruit. ?  cup of fresh, frozen, or canned fruit. ?  cup of 100% fruit juice.  Eat more foods that contain soluble fiber. These include apples, broccoli, carrots, beans, peas, and barley. Try to get 20-30 g of fiber per day.  Eat more home-cooked food. Eat less restaurant, buffet, and fast food.  Limit or avoid alcohol.  Limit foods high in starch and sugar.  Avoid fried foods.  Avoid frying your  food. Try baking, boiling, grilling, or broiling it instead. You can also reduce fat by: ? Removing the skin from poultry. ? Removing all visible fats from meats. ? Skimming the fat off of stews, soups, and gravies before serving them. ? Steaming vegetables in water or broth.  Lose weight if you are overweight.  Eat 4-5 servings of nuts, legumes, and seeds per week: ? One serving of dried beans or legumes equals  cup after being cooked. ? One serving of nuts equals 1 ounces. ? One serving of seeds equals  ounce or one tablespoon.  You may need to keep track of how much salt or sodium you eat. This is especially true if you have high blood pressure. Talk with your doctor or dietitian to get more information. What foods can I eat? Grains Breads, including [place], white, pita, wheat, raisin, rye, oatmeal, and [place]. Tortillas that are neither fried nor made with lard or trans fat. Low-fat rolls, including hotdog and hamburger buns and  English muffins. Biscuits. Muffins. Waffles. Pancakes. Light popcorn. Whole-grain cereals. Flatbread. Melba toast. Pretzels. Breadsticks. Rusks. Low-fat snacks. Low-fat crackers, including oyster, saltine, matzo, graham, animal, and rye. Rice and pasta, including brown rice and pastas that are made with whole wheat. Vegetables All vegetables. Fruits All fruits, but limit coconut. Meats and Other Protein Sources Lean, well-trimmed beef, veal, pork, and lamb. Chicken and [place] without skin. All fish and shellfish. Wild duck, rabbit, pheasant, and venison. Egg whites or low-cholesterol egg substitutes. Dried beans, peas, lentils, and tofu. Seeds and most nuts. Dairy Low-fat or nonfat cheeses, including ricotta, string, and mozzarella. Skim or 1% milk that is liquid, powdered, or evaporated. Buttermilk that is made with low-fat milk. Nonfat or low-fat yogurt. Beverages Mineral water. Diet carbonated beverages. Sweets and Desserts Sherbets and fruit ices.  Honey, jam, marmalade, jelly, and syrups. Meringues and gelatins. Pure sugar candy, such as hard candy, jelly beans, gumdrops, mints, marshmallows, and small amounts of dark chocolate. [employer]. Eat all sweets and desserts in moderation. Fats and Oils Nonhydrogenated (trans-free) margarines. Vegetable oils, including soybean, sesame, sunflower, olive, peanut, safflower, corn, canola, and cottonseed. Salad dressings or mayonnaise made with a vegetable oil. Limit added fats and oils that you use for cooking, baking, salads, and as spreads. Other Cocoa powder. Coffee and tea. All seasonings and condiments. The items listed above may not be a complete list of recommended foods or beverages. Contact your dietitian for more options. What foods are not recommended? Grains Breads that are made with saturated or trans fats, oils, or whole milk. Croissants. Butter rolls. Cheese breads. Sweet rolls. Donuts. Buttered popcorn. Chow mein noodles. High-fat crackers, such as cheese or butter crackers. Meats and Other Protein Sources Fatty meats, such as hotdogs, short ribs, sausage, spareribs, bacon, rib eye roast or steak, and mutton. High-fat deli meats, such as salami and bologna. Caviar. Domestic duck and goose. Organ meats, such as kidney, liver, sweetbreads, and heart. Dairy Cream, sour cream, cream cheese, and creamed cottage cheese. Whole-milk cheeses, including blue (bleu), [address], [place], [place], [address], [place], [address], cheddar, [place], and [place]. Whole or 2% milk that is liquid, evaporated, or condensed. Whole buttermilk. Cream sauce or high-fat cheese sauce. Yogurt that is made from whole milk. Beverages Regular sodas and juice drinks with added sugar. Sweets and Desserts Frosting. Pudding. Cookies. Cakes other than angel food cake. Candy that has milk chocolate or white chocolate, hydrogenated fat, butter, coconut, or unknown ingredients. Buttered syrups. Full-fat ice cream or ice  cream drinks. Fats and Oils Gravy that has suet, meat fat, or shortening. Cocoa butter, hydrogenated oils, palm oil, coconut oil, palm kernel oil. These can often be found in baked products, candy, fried foods, nondairy creamers, and whipped toppings. Solid fats and shortenings, including bacon fat, salt pork, lard, and butter. Nondairy cream substitutes, such as coffee creamers and sour cream substitutes. Salad dressings that are made of unknown oils, cheese, or sour cream. The items listed above may not be a complete list of foods and beverages to avoid. Contact your dietitian for more information. This information is not intended to replace advice given to you by your health care provider. Make sure you discuss any questions you have with your health care provider. Document Released: 04/21/2012 Document Revised: 03/28/2016 Document Reviewed: 04/14/2014 Elsevier Interactive Patient Education  [employer].    If you need a refill on your cardiac medications before your next appointment, please call your pharmacy.

## 2017-12-02 ENCOUNTER — Ambulatory Visit: Payer: Medicare Other | Admitting: Cardiology

## 2018-06-12 ENCOUNTER — Other Ambulatory Visit: Payer: Self-pay | Admitting: Obstetrics and Gynecology

## 2018-06-12 DIAGNOSIS — Z1231 Encounter for screening mammogram for malignant neoplasm of breast: Secondary | ICD-10-CM

## 2018-08-10 ENCOUNTER — Ambulatory Visit
Admission: RE | Admit: 2018-08-10 | Discharge: 2018-08-10 | Disposition: A | Discharge status: Home / Self Care | Payer: Medicare Other | Source: Ambulatory Visit | Attending: Obstetrics and Gynecology | Admitting: Obstetrics and Gynecology

## 2018-08-10 DIAGNOSIS — Z1231 Encounter for screening mammogram for malignant neoplasm of breast: Secondary | ICD-10-CM

## 2019-06-28 ENCOUNTER — Other Ambulatory Visit: Payer: Self-pay | Admitting: Obstetrics and Gynecology

## 2019-06-28 DIAGNOSIS — Z1231 Encounter for screening mammogram for malignant neoplasm of breast: Secondary | ICD-10-CM

## 2019-08-13 ENCOUNTER — Other Ambulatory Visit: Payer: Self-pay

## 2019-08-13 ENCOUNTER — Ambulatory Visit
Admission: RE | Admit: 2019-08-13 | Discharge: 2019-08-13 | Disposition: A | Discharge status: Home / Self Care | Payer: Medicare Other | Source: Ambulatory Visit | Attending: Obstetrics and Gynecology | Admitting: Obstetrics and Gynecology

## 2019-08-13 DIAGNOSIS — Z1231 Encounter for screening mammogram for malignant neoplasm of breast: Secondary | ICD-10-CM

## 2020-05-17 DIAGNOSIS — I10 Essential (primary) hypertension: Secondary | ICD-10-CM | POA: Diagnosis not present

## 2020-05-17 DIAGNOSIS — E785 Hyperlipidemia, unspecified: Secondary | ICD-10-CM | POA: Diagnosis not present

## 2020-07-06 ENCOUNTER — Other Ambulatory Visit: Payer: Self-pay | Admitting: Family Medicine

## 2020-07-06 ENCOUNTER — Other Ambulatory Visit: Payer: Self-pay

## 2020-07-06 DIAGNOSIS — Z1231 Encounter for screening mammogram for malignant neoplasm of breast: Secondary | ICD-10-CM

## 2020-08-10 DIAGNOSIS — I10 Essential (primary) hypertension: Secondary | ICD-10-CM | POA: Diagnosis not present

## 2020-08-10 DIAGNOSIS — E785 Hyperlipidemia, unspecified: Secondary | ICD-10-CM | POA: Diagnosis not present

## 2020-08-10 DIAGNOSIS — Z0000 Encounter for general adult medical examination without abnormal findings: Secondary | ICD-10-CM | POA: Diagnosis not present

## 2020-08-10 DIAGNOSIS — E039 Hypothyroidism, unspecified: Secondary | ICD-10-CM | POA: Diagnosis not present

## 2020-08-14 ENCOUNTER — Other Ambulatory Visit: Payer: Self-pay

## 2020-08-14 ENCOUNTER — Ambulatory Visit
Admission: RE | Admit: 2020-08-14 | Discharge: 2020-08-14 | Disposition: A | Discharge status: Home / Self Care | Payer: Medicare PPO | Source: Ambulatory Visit | Attending: Family Medicine | Admitting: Family Medicine

## 2020-08-14 DIAGNOSIS — Z1231 Encounter for screening mammogram for malignant neoplasm of breast: Secondary | ICD-10-CM | POA: Diagnosis not present

## 2020-08-18 ENCOUNTER — Other Ambulatory Visit: Payer: Self-pay | Admitting: Family Medicine

## 2020-08-18 DIAGNOSIS — R928 Other abnormal and inconclusive findings on diagnostic imaging of breast: Secondary | ICD-10-CM

## 2020-09-04 ENCOUNTER — Other Ambulatory Visit: Payer: Medicare PPO

## 2020-09-08 DIAGNOSIS — Z20822 Contact with and (suspected) exposure to covid-19: Secondary | ICD-10-CM | POA: Diagnosis not present

## 2020-09-20 ENCOUNTER — Other Ambulatory Visit: Payer: Self-pay

## 2020-09-20 ENCOUNTER — Ambulatory Visit
Admission: RE | Admit: 2020-09-20 | Discharge: 2020-09-20 | Disposition: A | Discharge status: Home / Self Care | Payer: Medicare PPO | Source: Ambulatory Visit | Attending: Family Medicine | Admitting: Family Medicine

## 2020-09-20 DIAGNOSIS — R928 Other abnormal and inconclusive findings on diagnostic imaging of breast: Secondary | ICD-10-CM

## 2020-09-20 DIAGNOSIS — N6002 Solitary cyst of left breast: Secondary | ICD-10-CM | POA: Diagnosis not present

## 2020-11-04 DIAGNOSIS — M4626 Osteomyelitis of vertebra, lumbar region: Secondary | ICD-10-CM

## 2020-11-04 HISTORY — DX: Osteomyelitis of vertebra, lumbar region: M46.26

## 2020-12-04 ENCOUNTER — Emergency Department (HOSPITAL_COMMUNITY): Payer: Medicare PPO

## 2020-12-04 ENCOUNTER — Other Ambulatory Visit: Payer: Self-pay

## 2020-12-04 ENCOUNTER — Inpatient Hospital Stay (HOSPITAL_COMMUNITY)
Admission: EM | Admit: 2020-12-04 | Discharge: 2020-12-13 | DRG: 871 | Disposition: A | Discharge status: Skilled Nursing Facility | Payer: Medicare PPO | Attending: Internal Medicine | Admitting: Internal Medicine

## 2020-12-04 ENCOUNTER — Encounter (HOSPITAL_COMMUNITY): Payer: Self-pay | Admitting: Emergency Medicine

## 2020-12-04 DIAGNOSIS — D649 Anemia, unspecified: Secondary | ICD-10-CM | POA: Diagnosis not present

## 2020-12-04 DIAGNOSIS — G8929 Other chronic pain: Secondary | ICD-10-CM | POA: Diagnosis present

## 2020-12-04 DIAGNOSIS — R310 Gross hematuria: Secondary | ICD-10-CM | POA: Diagnosis not present

## 2020-12-04 DIAGNOSIS — R5381 Other malaise: Secondary | ICD-10-CM | POA: Diagnosis present

## 2020-12-04 DIAGNOSIS — D72829 Elevated white blood cell count, unspecified: Secondary | ICD-10-CM | POA: Diagnosis not present

## 2020-12-04 DIAGNOSIS — I248 Other forms of acute ischemic heart disease: Secondary | ICD-10-CM | POA: Diagnosis present

## 2020-12-04 DIAGNOSIS — Z7982 Long term (current) use of aspirin: Secondary | ICD-10-CM | POA: Diagnosis not present

## 2020-12-04 DIAGNOSIS — R079 Chest pain, unspecified: Secondary | ICD-10-CM | POA: Diagnosis present

## 2020-12-04 DIAGNOSIS — D6959 Other secondary thrombocytopenia: Secondary | ICD-10-CM | POA: Diagnosis present

## 2020-12-04 DIAGNOSIS — N323 Diverticulum of bladder: Secondary | ICD-10-CM | POA: Diagnosis not present

## 2020-12-04 DIAGNOSIS — R2243 Localized swelling, mass and lump, lower limb, bilateral: Secondary | ICD-10-CM | POA: Diagnosis not present

## 2020-12-04 DIAGNOSIS — D259 Leiomyoma of uterus, unspecified: Secondary | ICD-10-CM | POA: Diagnosis not present

## 2020-12-04 DIAGNOSIS — Z7401 Bed confinement status: Secondary | ICD-10-CM | POA: Diagnosis not present

## 2020-12-04 DIAGNOSIS — N179 Acute kidney failure, unspecified: Secondary | ICD-10-CM | POA: Diagnosis present

## 2020-12-04 DIAGNOSIS — R0782 Intercostal pain: Secondary | ICD-10-CM | POA: Diagnosis not present

## 2020-12-04 DIAGNOSIS — M545 Low back pain, unspecified: Secondary | ICD-10-CM | POA: Diagnosis present

## 2020-12-04 DIAGNOSIS — E876 Hypokalemia: Secondary | ICD-10-CM | POA: Diagnosis not present

## 2020-12-04 DIAGNOSIS — Z20822 Contact with and (suspected) exposure to covid-19: Secondary | ICD-10-CM | POA: Diagnosis present

## 2020-12-04 DIAGNOSIS — R6520 Severe sepsis without septic shock: Secondary | ICD-10-CM | POA: Diagnosis present

## 2020-12-04 DIAGNOSIS — N132 Hydronephrosis with renal and ureteral calculous obstruction: Secondary | ICD-10-CM | POA: Diagnosis not present

## 2020-12-04 DIAGNOSIS — Z79890 Hormone replacement therapy (postmenopausal): Secondary | ICD-10-CM | POA: Diagnosis not present

## 2020-12-04 DIAGNOSIS — E785 Hyperlipidemia, unspecified: Secondary | ICD-10-CM | POA: Diagnosis present

## 2020-12-04 DIAGNOSIS — N200 Calculus of kidney: Secondary | ICD-10-CM | POA: Diagnosis not present

## 2020-12-04 DIAGNOSIS — E039 Hypothyroidism, unspecified: Secondary | ICD-10-CM | POA: Diagnosis present

## 2020-12-04 DIAGNOSIS — N289 Disorder of kidney and ureter, unspecified: Secondary | ICD-10-CM | POA: Diagnosis not present

## 2020-12-04 DIAGNOSIS — E860 Dehydration: Secondary | ICD-10-CM | POA: Diagnosis present

## 2020-12-04 DIAGNOSIS — S31010A Laceration without foreign body of lower back and pelvis without penetration into retroperitoneum, initial encounter: Secondary | ICD-10-CM | POA: Diagnosis not present

## 2020-12-04 DIAGNOSIS — R7989 Other specified abnormal findings of blood chemistry: Secondary | ICD-10-CM | POA: Diagnosis not present

## 2020-12-04 DIAGNOSIS — N3289 Other specified disorders of bladder: Secondary | ICD-10-CM | POA: Diagnosis not present

## 2020-12-04 DIAGNOSIS — R0602 Shortness of breath: Secondary | ICD-10-CM | POA: Diagnosis not present

## 2020-12-04 DIAGNOSIS — D6489 Other specified anemias: Secondary | ICD-10-CM | POA: Diagnosis present

## 2020-12-04 DIAGNOSIS — D696 Thrombocytopenia, unspecified: Secondary | ICD-10-CM | POA: Diagnosis present

## 2020-12-04 DIAGNOSIS — Z79899 Other long term (current) drug therapy: Secondary | ICD-10-CM | POA: Diagnosis not present

## 2020-12-04 DIAGNOSIS — J9811 Atelectasis: Secondary | ICD-10-CM | POA: Diagnosis not present

## 2020-12-04 DIAGNOSIS — X58XXXA Exposure to other specified factors, initial encounter: Secondary | ICD-10-CM | POA: Diagnosis not present

## 2020-12-04 DIAGNOSIS — E7800 Pure hypercholesterolemia, unspecified: Secondary | ICD-10-CM | POA: Diagnosis present

## 2020-12-04 DIAGNOSIS — I872 Venous insufficiency (chronic) (peripheral): Secondary | ICD-10-CM | POA: Diagnosis present

## 2020-12-04 DIAGNOSIS — N1330 Unspecified hydronephrosis: Secondary | ICD-10-CM | POA: Diagnosis not present

## 2020-12-04 DIAGNOSIS — N136 Pyonephrosis: Secondary | ICD-10-CM | POA: Diagnosis present

## 2020-12-04 DIAGNOSIS — R0789 Other chest pain: Secondary | ICD-10-CM | POA: Diagnosis not present

## 2020-12-04 DIAGNOSIS — A4151 Sepsis due to Escherichia coli [E. coli]: Principal | ICD-10-CM | POA: Diagnosis present

## 2020-12-04 DIAGNOSIS — K7200 Acute and subacute hepatic failure without coma: Secondary | ICD-10-CM | POA: Diagnosis present

## 2020-12-04 DIAGNOSIS — I10 Essential (primary) hypertension: Secondary | ICD-10-CM | POA: Diagnosis present

## 2020-12-04 DIAGNOSIS — N3000 Acute cystitis without hematuria: Secondary | ICD-10-CM | POA: Diagnosis not present

## 2020-12-04 DIAGNOSIS — Z6841 Body Mass Index (BMI) 40.0 and over, adult: Secondary | ICD-10-CM

## 2020-12-04 DIAGNOSIS — I214 Non-ST elevation (NSTEMI) myocardial infarction: Secondary | ICD-10-CM | POA: Diagnosis not present

## 2020-12-04 DIAGNOSIS — N210 Calculus in bladder: Secondary | ICD-10-CM | POA: Diagnosis not present

## 2020-12-04 DIAGNOSIS — M2550 Pain in unspecified joint: Secondary | ICD-10-CM | POA: Diagnosis not present

## 2020-12-04 DIAGNOSIS — R0902 Hypoxemia: Secondary | ICD-10-CM | POA: Diagnosis not present

## 2020-12-04 DIAGNOSIS — L8990 Pressure ulcer of unspecified site, unspecified stage: Secondary | ICD-10-CM | POA: Insufficient documentation

## 2020-12-04 DIAGNOSIS — N2889 Other specified disorders of kidney and ureter: Secondary | ICD-10-CM | POA: Diagnosis not present

## 2020-12-04 DIAGNOSIS — M6281 Muscle weakness (generalized): Secondary | ICD-10-CM | POA: Diagnosis not present

## 2020-12-04 DIAGNOSIS — D638 Anemia in other chronic diseases classified elsewhere: Secondary | ICD-10-CM | POA: Diagnosis not present

## 2020-12-04 DIAGNOSIS — R5383 Other fatigue: Secondary | ICD-10-CM | POA: Diagnosis not present

## 2020-12-04 DIAGNOSIS — M1710 Unilateral primary osteoarthritis, unspecified knee: Secondary | ICD-10-CM | POA: Diagnosis not present

## 2020-12-04 DIAGNOSIS — M549 Dorsalgia, unspecified: Secondary | ICD-10-CM | POA: Diagnosis not present

## 2020-12-04 LAB — TROPONIN I (HIGH SENSITIVITY)
Troponin I (High Sensitivity): 112 ng/L (ref <18)
Troponin I (High Sensitivity): 104 ng/L (ref <18)

## 2020-12-04 LAB — BASIC METABOLIC PANEL
Anion gap: 14 (ref 5–15)
BUN: 81 mg/dL — ABNORMAL HIGH (ref 8–23)
CO2: 22 mmol/L (ref 22–32)
Calcium: 8.2 mg/dL — ABNORMAL LOW (ref 8.9–10.3)
Chloride: 98 mmol/L (ref 98–111)
Creatinine, Ser: 3.96 mg/dL — ABNORMAL HIGH (ref 0.44–1.00)
GFR, Estimated: 12 mL/min — ABNORMAL LOW (ref >60)
Glucose, Bld: 94 mg/dL (ref 70–99)
Potassium: 3.6 mmol/L (ref 3.5–5.1)
Sodium: 134 mmol/L — ABNORMAL LOW (ref 135–145)

## 2020-12-04 LAB — CBC
HCT: 31.3 % — ABNORMAL LOW (ref 36.0–46.0)
Hemoglobin: 10.3 g/dL — ABNORMAL LOW (ref 12.0–15.0)
MCH: 27.5 pg (ref 26.0–34.0)
MCHC: 32.9 g/dL (ref 30.0–36.0)
MCV: 83.5 fL (ref 80.0–100.0)
Platelets: 92 10*3/uL — ABNORMAL LOW (ref 150–400)
RBC: 3.75 MIL/uL — ABNORMAL LOW (ref 3.87–5.11)
RDW: 15.7 % — ABNORMAL HIGH (ref 11.5–15.5)
WBC: 19.7 10*3/uL — ABNORMAL HIGH (ref 4.0–10.5)
nRBC: 0.2 % (ref 0.0–0.2)

## 2020-12-04 NOTE — ED Notes (Signed)
Dr Clayborne Dana notified of critical troponin, elevated BUN and Cr

## 2020-12-04 NOTE — ED Triage Notes (Signed)
Pt BIB [facility], c/o chest discomfort earlier today that has subsided. Also reports increased fatigue, shortness of breath with exertion and decreased appetite. EMS VSS.

## 2020-12-05 ENCOUNTER — Inpatient Hospital Stay (HOSPITAL_COMMUNITY): Payer: Medicare PPO

## 2020-12-05 ENCOUNTER — Encounter (HOSPITAL_COMMUNITY): Payer: Self-pay | Admitting: Family Medicine

## 2020-12-05 DIAGNOSIS — D6489 Other specified anemias: Secondary | ICD-10-CM | POA: Diagnosis present

## 2020-12-05 DIAGNOSIS — R0602 Shortness of breath: Secondary | ICD-10-CM | POA: Diagnosis not present

## 2020-12-05 DIAGNOSIS — R5383 Other fatigue: Secondary | ICD-10-CM | POA: Diagnosis not present

## 2020-12-05 DIAGNOSIS — R0782 Intercostal pain: Secondary | ICD-10-CM | POA: Diagnosis not present

## 2020-12-05 DIAGNOSIS — R079 Chest pain, unspecified: Secondary | ICD-10-CM | POA: Diagnosis not present

## 2020-12-05 DIAGNOSIS — D696 Thrombocytopenia, unspecified: Secondary | ICD-10-CM

## 2020-12-05 DIAGNOSIS — E039 Hypothyroidism, unspecified: Secondary | ICD-10-CM | POA: Diagnosis present

## 2020-12-05 DIAGNOSIS — M545 Low back pain, unspecified: Secondary | ICD-10-CM | POA: Diagnosis present

## 2020-12-05 DIAGNOSIS — Z79899 Other long term (current) drug therapy: Secondary | ICD-10-CM | POA: Diagnosis not present

## 2020-12-05 DIAGNOSIS — R6520 Severe sepsis without septic shock: Secondary | ICD-10-CM | POA: Diagnosis present

## 2020-12-05 DIAGNOSIS — E785 Hyperlipidemia, unspecified: Secondary | ICD-10-CM | POA: Diagnosis present

## 2020-12-05 DIAGNOSIS — R5381 Other malaise: Secondary | ICD-10-CM | POA: Diagnosis present

## 2020-12-05 DIAGNOSIS — K7200 Acute and subacute hepatic failure without coma: Secondary | ICD-10-CM | POA: Diagnosis present

## 2020-12-05 DIAGNOSIS — E860 Dehydration: Secondary | ICD-10-CM | POA: Diagnosis present

## 2020-12-05 DIAGNOSIS — E7800 Pure hypercholesterolemia, unspecified: Secondary | ICD-10-CM

## 2020-12-05 DIAGNOSIS — I248 Other forms of acute ischemic heart disease: Secondary | ICD-10-CM | POA: Diagnosis present

## 2020-12-05 DIAGNOSIS — N136 Pyonephrosis: Secondary | ICD-10-CM | POA: Diagnosis present

## 2020-12-05 DIAGNOSIS — X58XXXA Exposure to other specified factors, initial encounter: Secondary | ICD-10-CM | POA: Diagnosis not present

## 2020-12-05 DIAGNOSIS — I10 Essential (primary) hypertension: Secondary | ICD-10-CM | POA: Diagnosis present

## 2020-12-05 DIAGNOSIS — G8929 Other chronic pain: Secondary | ICD-10-CM | POA: Diagnosis present

## 2020-12-05 DIAGNOSIS — R310 Gross hematuria: Secondary | ICD-10-CM | POA: Diagnosis not present

## 2020-12-05 DIAGNOSIS — Z6841 Body Mass Index (BMI) 40.0 and over, adult: Secondary | ICD-10-CM | POA: Diagnosis not present

## 2020-12-05 DIAGNOSIS — Z7982 Long term (current) use of aspirin: Secondary | ICD-10-CM | POA: Diagnosis not present

## 2020-12-05 DIAGNOSIS — Z79890 Hormone replacement therapy (postmenopausal): Secondary | ICD-10-CM | POA: Diagnosis not present

## 2020-12-05 DIAGNOSIS — D72829 Elevated white blood cell count, unspecified: Secondary | ICD-10-CM

## 2020-12-05 DIAGNOSIS — N289 Disorder of kidney and ureter, unspecified: Secondary | ICD-10-CM | POA: Diagnosis not present

## 2020-12-05 DIAGNOSIS — D649 Anemia, unspecified: Secondary | ICD-10-CM

## 2020-12-05 DIAGNOSIS — D6959 Other secondary thrombocytopenia: Secondary | ICD-10-CM | POA: Diagnosis present

## 2020-12-05 DIAGNOSIS — N179 Acute kidney failure, unspecified: Secondary | ICD-10-CM | POA: Diagnosis present

## 2020-12-05 DIAGNOSIS — E876 Hypokalemia: Secondary | ICD-10-CM | POA: Diagnosis not present

## 2020-12-05 DIAGNOSIS — A4151 Sepsis due to Escherichia coli [E. coli]: Secondary | ICD-10-CM | POA: Diagnosis present

## 2020-12-05 DIAGNOSIS — R7989 Other specified abnormal findings of blood chemistry: Secondary | ICD-10-CM | POA: Diagnosis not present

## 2020-12-05 DIAGNOSIS — Z20822 Contact with and (suspected) exposure to covid-19: Secondary | ICD-10-CM | POA: Diagnosis present

## 2020-12-05 LAB — IRON AND TIBC
Iron: 47 ug/dL (ref 28–170)
Saturation Ratios: 31 % (ref 10.4–31.8)
TIBC: 154 ug/dL — ABNORMAL LOW (ref 250–450)
UIBC: 107 ug/dL

## 2020-12-05 LAB — CBC WITH DIFFERENTIAL/PLATELET
Abs Immature Granulocytes: 0.12 10*3/uL — ABNORMAL HIGH (ref 0.00–0.07)
Basophils Absolute: 0 10*3/uL (ref 0.0–0.1)
Basophils Relative: 0 %
Eosinophils Absolute: 0 10*3/uL (ref 0.0–0.5)
Eosinophils Relative: 0 %
HCT: 31.5 % — ABNORMAL LOW (ref 36.0–46.0)
Hemoglobin: 10.5 g/dL — ABNORMAL LOW (ref 12.0–15.0)
Immature Granulocytes: 1 %
Lymphocytes Relative: 5 %
Lymphs Abs: 0.8 10*3/uL (ref 0.7–4.0)
MCH: 28.4 pg (ref 26.0–34.0)
MCHC: 33.3 g/dL (ref 30.0–36.0)
MCV: 85.1 fL (ref 80.0–100.0)
Monocytes Absolute: 0.6 10*3/uL (ref 0.1–1.0)
Monocytes Relative: 3 %
Neutro Abs: 15 10*3/uL — ABNORMAL HIGH (ref 1.7–7.7)
Neutrophils Relative %: 91 %
Platelets: 108 10*3/uL — ABNORMAL LOW (ref 150–400)
RBC: 3.7 MIL/uL — ABNORMAL LOW (ref 3.87–5.11)
RDW: 15.8 % — ABNORMAL HIGH (ref 11.5–15.5)
WBC: 16.6 10*3/uL — ABNORMAL HIGH (ref 4.0–10.5)
nRBC: 0.1 % (ref 0.0–0.2)

## 2020-12-05 LAB — URINALYSIS, COMPLETE (UACMP) WITH MICROSCOPIC
Bilirubin Urine: NEGATIVE
Glucose, UA: NEGATIVE mg/dL
Ketones, ur: NEGATIVE mg/dL
Nitrite: NEGATIVE
Protein, ur: 100 mg/dL — AB
RBC / HPF: >50 RBC/hpf — ABNORMAL HIGH (ref 0–5)
Specific Gravity, Urine: 1.016 (ref 1.005–1.030)
WBC, UA: >50 WBC/hpf — ABNORMAL HIGH (ref 0–5)
pH: 8 (ref 5.0–8.0)

## 2020-12-05 LAB — COMPREHENSIVE METABOLIC PANEL
ALT: 56 U/L — ABNORMAL HIGH (ref 0–44)
AST: 58 U/L — ABNORMAL HIGH (ref 15–41)
Albumin: 2.4 g/dL — ABNORMAL LOW (ref 3.5–5.0)
Alkaline Phosphatase: 74 U/L (ref 38–126)
Anion gap: 13 (ref 5–15)
BUN: 93 mg/dL — ABNORMAL HIGH (ref 8–23)
CO2: 22 mmol/L (ref 22–32)
Calcium: 8 mg/dL — ABNORMAL LOW (ref 8.9–10.3)
Chloride: 100 mmol/L (ref 98–111)
Creatinine, Ser: 3.76 mg/dL — ABNORMAL HIGH (ref 0.44–1.00)
GFR, Estimated: 12 mL/min — ABNORMAL LOW (ref >60)
Glucose, Bld: 144 mg/dL — ABNORMAL HIGH (ref 70–99)
Potassium: 3.5 mmol/L (ref 3.5–5.1)
Sodium: 135 mmol/L (ref 135–145)
Total Bilirubin: 1.3 mg/dL — ABNORMAL HIGH (ref 0.3–1.2)
Total Protein: 6.6 g/dL (ref 6.5–8.1)

## 2020-12-05 LAB — ECHOCARDIOGRAM COMPLETE
AR max vel: 2.31 cm2
AV Area VTI: 2.35 cm2
AV Area mean vel: 2.38 cm2
AV Mean grad: 7.9 mmHg
AV Peak grad: 12.6 mmHg
Ao pk vel: 1.78 m/s
Area-P 1/2: 2.42 cm2
Height: 66 in
S' Lateral: 2.6 cm
Weight: 4320 oz

## 2020-12-05 LAB — RETICULOCYTES
Immature Retic Fract: 12 % (ref 2.3–15.9)
RBC.: 3.73 MIL/uL — ABNORMAL LOW (ref 3.87–5.11)
Retic Count, Absolute: 42.9 10*3/uL (ref 19.0–186.0)
Retic Ct Pct: 1.2 % (ref 0.4–3.1)

## 2020-12-05 LAB — TSH: TSH: 2.088 u[IU]/mL (ref 0.350–4.500)

## 2020-12-05 LAB — SARS CORONAVIRUS 2 BY RT PCR (HOSPITAL ORDER, PERFORMED IN ~~LOC~~ HOSPITAL LAB): SARS Coronavirus 2: NEGATIVE

## 2020-12-05 LAB — MAGNESIUM: Magnesium: 2.7 mg/dL — ABNORMAL HIGH (ref 1.7–2.4)

## 2020-12-05 LAB — PATHOLOGIST SMEAR REVIEW

## 2020-12-05 LAB — FOLATE: Folate: 22.5 ng/mL (ref >5.9)

## 2020-12-05 LAB — LIPID PANEL
Cholesterol: 171 mg/dL (ref 0–200)
HDL: 14 mg/dL — ABNORMAL LOW (ref >40)
LDL Cholesterol: 116 mg/dL — ABNORMAL HIGH (ref 0–99)
Total CHOL/HDL Ratio: 12.2 RATIO
Triglycerides: 205 mg/dL — ABNORMAL HIGH (ref <150)
VLDL: 41 mg/dL — ABNORMAL HIGH (ref 0–40)

## 2020-12-05 LAB — CREATININE, URINE, RANDOM: Creatinine, Urine: 228.08 mg/dL

## 2020-12-05 LAB — FERRITIN: Ferritin: 1428 ng/mL — ABNORMAL HIGH (ref 11–307)

## 2020-12-05 LAB — VITAMIN B12: Vitamin B-12: 651 pg/mL (ref 180–914)

## 2020-12-05 LAB — SAVE SMEAR(SSMR), FOR PROVIDER SLIDE REVIEW

## 2020-12-05 LAB — PHOSPHORUS: Phosphorus: 4 mg/dL (ref 2.5–4.6)

## 2020-12-05 LAB — SODIUM, URINE, RANDOM: Sodium, Ur: 24 mmol/L

## 2020-12-05 MED ORDER — NITROGLYCERIN 0.4 MG SL SUBL: 0.4000 mg | SUBLINGUAL_TABLET | SUBLINGUAL | Status: DC | PRN

## 2020-12-05 MED ORDER — ACETAMINOPHEN 325 MG PO TABS
650.0000 mg | ORAL_TABLET | ORAL | Status: DC | PRN
  Administered 2020-12-05 – 2020-12-10 (×8): 650 mg via ORAL
  Filled 2020-12-05 (×8): qty 2

## 2020-12-05 MED ORDER — ASPIRIN 81 MG PO CHEW: 81.0000 mg | CHEWABLE_TABLET | Freq: Every morning | ORAL | Status: DC

## 2020-12-05 MED ORDER — ASPIRIN EC 81 MG PO TBEC
81.0000 mg | DELAYED_RELEASE_TABLET | Freq: Every day | ORAL | Status: DC
  Administered 2020-12-06 – 2020-12-13 (×8): 81 mg via ORAL
  Filled 2020-12-05 (×8): qty 1

## 2020-12-05 MED ORDER — SODIUM CHLORIDE 0.9 % IV SOLN: INTRAVENOUS | Status: AC

## 2020-12-05 MED ORDER — CARVEDILOL 12.5 MG PO TABS
12.5000 mg | ORAL_TABLET | Freq: Two times a day (BID) | ORAL | Status: DC
  Administered 2020-12-05 – 2020-12-07 (×5): 12.5 mg via ORAL
  Filled 2020-12-05 (×6): qty 1

## 2020-12-05 MED ORDER — LEVOTHYROXINE SODIUM 100 MCG PO TABS
100.0000 ug | ORAL_TABLET | Freq: Every day | ORAL | Status: DC
  Administered 2020-12-05 – 2020-12-13 (×9): 100 ug via ORAL
  Filled 2020-12-05 (×9): qty 1

## 2020-12-05 MED ORDER — ONDANSETRON HCL 4 MG/2ML IJ SOLN: 4.0000 mg | Freq: Four times a day (QID) | INTRAMUSCULAR | Status: DC | PRN

## 2020-12-05 NOTE — Consult Note (Signed)
Cardiology Consult    Patient ID: Cassidy Austin MRN: [id number], DOB/AGE: February 05, 1951   Admit date: 12/04/2020 Date of Consult: 12/05/2020  Primary Physician: Laurann Montana, MD Primary Cardiologist: No primary care provider on file. Requesting Provider: Odie Sera MD  Patient Profile    Juliette Standre is a 70 y.o. female with a history of hypothyroidism, goiter, HTN who presents for lethargy on whom we are consulted for elevated troponin.  History of Present Illness    70 year old female with history as above who presents for lethargy.  She reports that since Friday she has been having worsening generalized lethargy and weakness.  This has led to difficulties transferring and she had two falls for which she had to call EMS when she was unable to exit a chair.  Associated with the lethargy she's had some shortness of breath that is occurring at rest but not clearly exertional (she';s really not able to exert herself much.  She sleeps sitting up and has for some time but denies clear orthopnea.  No PND.  No palpitations.  No chest pain.  Review of systems is otherwise unremarkable aside from some back pain that has been bothering her since the time of her recent fall.  She does have a history of goiter and hypothyroidism but hasn't changed synthroid dose in years and takes it appropriately; no recent missed doses.  No prior cardiovascular history.  We were consulted because troponin was elevated at presentation.  It was adynamic on repeat.  ECG reviewed and is pretty normal and unchanged from presentation a few years ago.  Past Medical History   Past Medical History:  Diagnosis Date  . Complication of anesthesia    hard to wake up after eye surgery in 1970s  . Goiter    with thyroid nodule, FNA 12/15 benign follicular nodule  . Hypercholesterolemia   . Hypertension   . Hypothyroidism   . OA (osteoarthritis) of knee    mod.    Past Surgical History:  Procedure Laterality  Date  . COLONOSCOPY    . EYE SURGERY    . THYROID SURGERY  1999     No Known Allergies Inpatient Medications    . [START ON 12/06/2020] aspirin EC  81 mg Oral Daily    Family History    Family History  Problem Relation Age of Onset  . Heart failure Mother   . Breast cancer Neg Hx    She indicated that the status of her mother is unknown. She indicated that the status of her neg hx is unknown.   Social History    Social History   Socioeconomic History  . Marital status: Divorced    Spouse name: Not on file  . Number of children: Not on file  . Years of education: Not on file  . Highest education level: Not on file  Occupational History  . Not on file  Tobacco Use  . Smoking status: Never Smoker  . Smokeless tobacco: Never Used  Substance and Sexual Activity  . Alcohol use: No  . Drug use: No  . Sexual activity: Not on file  Other Topics Concern  . Not on file  Social History Narrative  . Not on file   Social Determinants of Health   Financial Resource Strain: Not on file  Food Insecurity: Not on file  Transportation Needs: Not on file  Physical Activity: Not on file  Stress: Not on file  Social Connections: Not on file  Intimate Partner  Violence: Not on file     Review of Systems    General:  No chills, fever, night sweats or weight changes.  Cardiovascular:  No chest pain, dyspnea on exertion, edema, orthopnea, palpitations, paroxysmal nocturnal dyspnea. Dermatological: No rash, lesions/masses Respiratory: No cough, dyspnea Urologic: No hematuria, dysuria Abdominal:   No nausea, vomiting, diarrhea, bright red blood per rectum, melena, or hematemesis Neurologic:  No visual changes, wkns, changes in mental status. All other systems reviewed and are otherwise negative except as noted above.  Physical Exam    Blood pressure (!) 131/55, pulse 66, temperature 98.4 F (36.9 C), temperature source Oral, resp. rate 18, height 5\' 6"  (1.676 m), weight 122.5  kg, SpO2 93 %.    No intake or output data in the 24 hours ending 12/05/20 0136 Wt Readings from Last 3 Encounters:  12/04/20 122.5 kg  11/25/17 135.1 kg  01/24/12 126.1 kg    CONSTITUTIONAL: alert and conversant, well-appearing, nourished, no distress HEENT: oropharynx clear and moist, no mucosal lesions, normal dentition, conjunctiva normal, EOM intact, pupils equal, no lid lag. NECK: supple, no cervical adenopathy, asymmetric bilateral hardened thyroid gland CARDIOVASCULAR: Regular rhythm. No gallop, murmur, or rub. Normal S1/S2. Radial pulses intact. JVP flat. No carotid bruits. PULMONARY/CHEST WALL: no deformities, normal breath sounds bilaterally, normal work of breathing ABDOMINAL: soft, non-tender, non-distended EXTREMITIES: no edema or muscle atrophy, warm and well-perfused SKIN: Dry and intact without apparent rashes or wounds. NEUROLOGIC: alert, normal gait, no abnormal movements, cranial nerves grossly intact.   Labs    Troponin [facility] of Care Test) TroponinI 104, 112  Lab Results  Component Value Date   WBC 19.7 (H) 12/04/2020   HGB 10.3 (L) 12/04/2020   HCT 31.3 (L) 12/04/2020   MCV 83.5 12/04/2020   PLT 92 (L) 12/04/2020    Recent Labs  Lab 12/04/20 1737  NA 134*  K 3.6  CL 98  CO2 22  BUN 81*  CREATININE 3.96*  CALCIUM 8.2*  GLUCOSE 94   No results found for: CHOL, HDL, LDLCALC, TRIG No results found for: [facility]   Radiology Studies    DG Chest 2 View  Result Date: 12/04/2020 CLINICAL DATA:  Chest pain. EXAM: CHEST - 2 VIEW COMPARISON:  None. FINDINGS: The heart size and mediastinal contours are within normal limits. Both lungs are clear. No pneumothorax or pleural effusion is noted. The visualized skeletal structures are unremarkable. IMPRESSION: No active cardiopulmonary disease. Aortic Atherosclerosis (ICD10-I70.0). Electronically Signed   By: 12/06/2020 M.D.   On: 12/04/2020 18:09    ECG & Cardiac Imaging   NSR, normal ECG unchanged  from prior  Assessment & Plan    70 year old female presenting with lethargy, fatigue, shortness of breath.  She has no chest pain.  Troponin was checked for unclear reason and is mildly elevated.   Other pertinent features of presenation include new onset renal failure; liver function not evaluated.    At this time suspect mild supply demand mismatch in the setting of unappreciated systemic process.  Differential includes but not limited to sepsis with significant leukocytosis, pulmonary embolism, hypothyroidsim, anemia, malignancy.  Problem list #Fatigue #Shortness of breath #Troponin elevation #Hypothyroidism #Leukocytosis #Anemia #Thrombocytopenia  Recommendations #Fatigue #Shortness of breath #Troponin elevation #Hypothyroidsim - Check TSH - Routine echocardiogram - Consider VQ scan/exclude PE - No antithrombotics indicated from our perspective  #Leukocytosis #Anemia #Thrombocytopenia #(presumed) acute renal insufficiency - Workup per admitting hospitalist; some concern on my part for hematologic malignancy if sepsis  workup unrevealing.  Signed, Regino Schultze, MD 12/05/2020, 1:36 AM  For questions or updates, please contact   Please consult [email or web address] for contact info under Cardiology/STEMI.

## 2020-12-05 NOTE — Progress Notes (Signed)
Progress Note  Patient Name: Cassidy Austin Date of Encounter: 12/05/2020  [facility] Cardiologist: Dr Eldridge Dace  Subjective   The patient continues to feel SOB, denies fever/chills or cough.  Inpatient Medications    Scheduled Meds: . [START ON 12/06/2020] aspirin EC  81 mg Oral Daily  . carvedilol  12.5 mg Oral BID WC  . levothyroxine  100 mcg Oral Q0600   Continuous Infusions: . sodium chloride 100 mL/hr at 12/05/20 0333   PRN Meds: acetaminophen, nitroGLYCERIN, ondansetron (ZOFRAN) IV   Vital Signs    Vitals:   12/05/20 0730 12/05/20 0830 12/05/20 0900 12/05/20 0930  BP: (!) 96/54 (!) 105/58 121/88   Pulse: 77 78  72  Resp: (!) 24 (!) 21 (!) 26 (!) 23  Temp: 97.8 F (36.6 C)     TempSrc: Oral     SpO2: 98% 93%  98%  Weight:      Height:       No intake or output data in the 24 hours ending 12/05/20 1103 Last 3 Weights 12/04/2020 11/25/2017 01/24/2012  Weight (lbs) 270 lb 297 lb 12.8 oz 278 lb  Weight (kg) 122.471 kg 135.081 kg 126.1 kg      Telemetry    SR - Personally Reviewed  ECG    SR, normal ECG - Personally Reviewed  Physical Exam   GEN: No acute distress.   Neck: No JVD Cardiac: RRR, no murmurs, rubs, or gallops.  Respiratory: Clear to auscultation bilaterally. GI: Soft, nontender, non-distended  MS: No edema; No deformity. Very warm extremities Neuro:  Nonfocal  Psych: Normal affect   Labs    High Sensitivity Troponin:   Recent Labs  Lab 12/04/20 1737 12/04/20 2022  TROPONINIHS 112* 104*     Chemistry Recent Labs  Lab 12/04/20 1737 12/05/20 0424  NA 134* 135  K 3.6 3.5  CL 98 100  CO2 22 22  GLUCOSE 94 144*  BUN 81* 93*  CREATININE 3.96* 3.76*  CALCIUM 8.2* 8.0*  PROT  --  6.6  ALBUMIN  --  2.4*  AST  --  58*  ALT  --  56*  ALKPHOS  --  74  BILITOT  --  1.3*  GFRNONAA 12* 12*  ANIONGAP 14 13    Hematology Recent Labs  Lab 12/04/20 1737 12/05/20 0424  WBC 19.7* 16.6*  RBC 3.75* 3.70*  3.73*  HGB 10.3*  10.5*  HCT 31.3* 31.5*  MCV 83.5 85.1  MCH 27.5 28.4  MCHC 32.9 33.3  RDW 15.7* 15.8*  PLT 92* 108*    BNPNo results for input(s): BNP, PROBNP in the last 168 hours.   DDimer No results for input(s): DDIMER in the last 168 hours.   Radiology    DG Chest 2 View  Result Date: 12/04/2020 CLINICAL DATA:  Chest pain. EXAM: CHEST - 2 VIEW COMPARISON:  None. FINDINGS: The heart size and mediastinal contours are within normal limits. Both lungs are clear. No pneumothorax or pleural effusion is noted. The visualized skeletal structures are unremarkable. IMPRESSION: No active cardiopulmonary disease. Aortic Atherosclerosis (ICD10-I70.0). Electronically Signed   By: Lupita Raider M.D.   On: 12/04/2020 18:09   DG Lumbar Spine 2-3 Views  Result Date: 12/05/2020 CLINICAL DATA:  Initially presented with chest pain now with low back pain EXAM: LUMBAR SPINE - 2-3 VIEW COMPARISON:  None. FINDINGS: Five lumbar type vertebral levels. Dextrocurvature of the lumbar spine with an apex at the L3 level. There is grade 1 anterolisthesis L3  on 4, L4 on 5 with what are likely L3 pars defects, age indeterminate. Findings on a background of diffuse intervertebral disc height loss with multilevel discogenic and facet degenerative changes well as interspinous arthrosis compatible with Baastrup's disease. Normal bone mineralization. No conspicuous lytic or blastic lesions are seen. Included portions of the bony pelvis of appear grossly intact and congruent albeit with degenerative changes in the imaged hips and SI joints. Normal bowel gas pattern. Atherosclerotic calcification of the aorta. Remaining soft tissues are unremarkable. IMPRESSION: 1. 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 Correlate for point tenderness. 2. No other acute osseous abnormality or traumatic malalignment. 3. Background of moderate multilevel discogenic and facet degenerative changes of the lumbar  spine. Electronically Signed   By: Kreg Shropshire M.D.   On: 12/05/2020 02:49   US RENAL  Result Date: 12/05/2020 CLINICAL DATA:  Renal insufficiency EXAM: RENAL / URINARY TRACT ULTRASOUND COMPLETE COMPARISON:  None. FINDINGS: Right Kidney: Renal measurements: 13.6 x 6.7 x 6.2 = volume: 297.7 mL. Suboptimal visualization with extensive rib shadowing and partial obscuration of portions of the renal parenchyma. No visible lesion, shadowing calculus or hydronephrosis. Cortical echogenicity is grossly normal. Left Kidney: Renal measurements: 10.7 x 6.1 x 6.0 = volume: 205.6 mL. Suboptimal visualization with extensive rib shadowing and partial obscuration of portions of the renal parenchyma. No visible lesion, shadowing calculus or hydronephrosis. Cortical echogenicity is grossly normal. Bladder: Bladder partially decompressed with some mild trabeculation. A small 2.3 x 1.9 x 3.7 cm bladder diverticulum is noted along the right lateral aspect of the urinary bladder. Other: Markedly suboptimal assessment of the bilateral kidneys due to a combination of body habitus, bowel gas, poor sonographic windowing and inability to lay flat or reposition in decubitus. IMPRESSION: 1. Suboptimal visualization of the kidneys due to body habitus, rib shadowing and inability to lay flat or reposition in decubitus position. 2. No gross renal abnormality is seen though portions of renal parenchyma incompletely visualized. 3. Mild trabeculation of the bladder with small 3.7 cm bladder diverticulum. Correlate with urinalysis to exclude superimposed cystitis. Electronically Signed   By: Kreg Shropshire M.D.   On: 12/05/2020 03:26   ECHOCARDIOGRAM COMPLETE Result Date: 12/05/2020     Cardiac Studies   ECHOCARDIOGRAM COMPLETE Result Date: 12/05/2020 1. Left ventricular ejection fraction, by estimation, is 60 to 65%. The  left ventricle has normal function. The left ventricle has no regional  wall motion abnormalities. There is mild  concentric left ventricular  hypertrophy. Left ventricular diastolic  parameters are consistent with Grade I diastolic dysfunction (impaired  relaxation). The average left ventricular global longitudinal strain is  -15.8 %. The global longitudinal strain is abnormal.  2. Right ventricular systolic function is normal. The right ventricular  size is normal.  3. The mitral valve is normal in structure. No evidence of mitral valve  regurgitation. No evidence of mitral stenosis.  4. The aortic valve is normal in structure. Aortic valve regurgitation is  not visualized. No aortic stenosis is present.  5. The inferior vena cava is normal in size with greater than 50%  respiratory variability, suggesting right atrial pressure of 3 mmHg.   Patient Profile     70 y.o. female presenting with lethargy, fatigue, shortness of breath.  She has no chest pain.  Troponin was checked for unclear reason and is mildly elevated.   Other pertinent features of presenation include new onset renal failure; liver function not evaluated.  At this time suspect mild supply demand mismatch in the setting of unappreciated systemic process.  Differential includes but not limited to sepsis with significant leukocytosis, pulmonary embolism, hypothyroidsim, anemia, malignancy.  Assessment & Plan    Problem list  #Shortness of breath #Troponin elevation - flat trend, most probably related to underlying sepsis  #Hypothyroidsim - Check TSH - Echocardiogram shows normal LVEF 60 to 65% with grade 1 diastolic dysfunction and mildly impaired strain - Consider VQ scan/exclude PE - No antithrombotics indicated from our perspective -She is not a candidate for coronary CTA to evaluate for potential coronary artery disease, once her work-up is completed by primary team I would suggest that she undergoes nuclear stress testing.  #Fatigue, poor appetite #Leukocytosis #Anemia #Thrombocytopenia #(presumed) acute renal  insufficiency - Workup per admitting hospitalist; some concern on my part for hematologic malignancy if sepsis workup unrevealing.  For questions or updates, please contact [facility] Please consult [email or web address] for contact info under     Signed, Tobias Alexander, MD  12/05/2020, 11:03 AM

## 2020-12-05 NOTE — ED Notes (Signed)
Lunch Tray Ordered @ 1034. 

## 2020-12-05 NOTE — ED Notes (Signed)
Tele Breakfast order placed 

## 2020-12-05 NOTE — Progress Notes (Signed)
  Echocardiogram 2D Echocardiogram with 3D has been performed.  Cassidy Austin M 12/05/2020, 8:55 AM

## 2020-12-05 NOTE — H&P (Signed)
History and Physical    Cassidy Austin WUJ:[id number] DOB: 03/25/1951 DOA: 12/04/2020  PCP: Laurann Montana, MD   Patient coming from: Home   Chief Complaint: Chest pain, SOB, fatigue   HPI: Cassidy Austin is a 70 y.o. female with medical history significant for hypertension, hypothyroidism, and chronic bilateral leg swelling, now presenting to the emergency department for evaluation of chest pain, shortness of breath, and fatigue.  Patient has had decreased activity tolerance, exertional dyspnea, and increased fatigue over the past 4 days, and also had some chest discomfort with exertion over the past few days that was mild and quickly resolved with rest.  Yesterday (12/04/2020) she was doing some light housework when she became short of breath and had more intense chest discomfort.  She began to feel better within several minutes of sitting down but had recurrent symptoms when she attempted to resume activity.  She denies any recent cough, fevers, increased leg swelling, or orthopnea.  Denies any melena or hematochezia.  Reports that she had some blood work performed in October 2021 with her PCP which she believes was fairly normal.  She was taken off iron supplements recently.  She has naproxen on her medication list but does not use it.  She noticed some gross hematuria yesterday without any dysuria or flank pain though she has recently had some midline low back pain without radiation.  She takes Lasix twice a day for chronic leg swelling.  ED Course: Upon arrival to the ED, patient is found to be afebrile, saturating mid 90s on room air, and with systolic blood pressure 100.  EKG features a sinus rhythm and chest x-ray negative for acute cardiopulmonary disease.  Chemistry panel notable for BUN 81 and creatinine 3.96.  CBC is notable for leukocytosis to 19,700, hemoglobin 10.3, and platelets 92,000.  High-sensitivity troponin was 112 and then 104.  COVID-19 screening test is pending.  Cardiology  was consulted by the ED physician and recommended medical admission.  Review of Systems:  All other systems reviewed and apart from HPI, are negative.  Past Medical History:  Diagnosis Date  . Complication of anesthesia    hard to wake up after eye surgery in 1970s  . Goiter    with thyroid nodule, FNA 12/15 benign follicular nodule  . Hypercholesterolemia   . Hypertension   . Hypothyroidism   . OA (osteoarthritis) of knee    mod.    Past Surgical History:  Procedure Laterality Date  . COLONOSCOPY    . EYE SURGERY    . THYROID SURGERY  1999    Social History:   reports that she has never smoked. She has never used smokeless tobacco. She reports that she does not drink alcohol and does not use drugs.  No Known Allergies  Family History  Problem Relation Age of Onset  . Heart failure Mother   . Breast cancer Neg Hx      Prior to Admission medications   Medication Sig Start Date End Date Taking? Authorizing Provider  aspirin 81 MG chewable tablet Chew 81 mg by mouth every morning.    [provider]  Calcium Carbonate (CALCIUM 500 PO) Take 1,000 mg by mouth 2 (two) times daily.    [provider]  carvedilol (COREG) 6.25 MG tablet Take 6.25 mg by mouth 2 (two) times daily. 11/19/17   [provider]  cholecalciferol (VITAMIN D) 1000 UNITS tablet Take 1,000 Units by mouth 2 (two) times daily.    [provider]  ferrous sulfate 325 (65 FE) MG tablet Take 325 mg by mouth daily. 10/20/17   [provider]  fish oil-omega-3 fatty acids 1000 MG capsule Take 1 g by mouth every morning.    [provider]  furosemide (LASIX) 40 MG tablet Take 40 mg by mouth daily. 09/18/16   [provider]  Garlic (GARLIQUE) 400 MG TBEC Take 1 tablet by mouth every morning.    [provider]  HYDROcodone-acetaminophen (VICODIN) 5-500 MG per tablet 1-2 po q 6 hrs prn 01/30/12   Gerald Leitz, MD  KLOR-CON M20 20 MEQ tablet Take  20 mEq by mouth daily. 09/30/17   [provider]  levothyroxine (SYNTHROID, LEVOTHROID) 100 MCG tablet Take 100 mcg by mouth daily at 6 (six) AM.    [provider]  Magnesium 250 MG TABS Take 500 mg by mouth every morning.    [provider]  naproxen (NAPROSYN) 500 MG tablet Take 1 tablet by mouth 2 (two) times daily as needed. 09/02/16   [provider]  olmesartan-hydrochlorothiazide (BENICAR HCT) 40-25 MG tablet Take 1 tablet by mouth daily. 10/11/17   [provider]  Red Yeast Rice Extract (RED YEAST RICE PO) Take 1 tablet by mouth 2 (two) times daily.    [provider]  tetrahydrozoline 0.05 % ophthalmic solution Apply 1 drop to eye as needed. Visine for dry eyes    [provider]  TURMERIC PO Take 2 tablets by mouth daily.    [provider]  valsartan-hydrochlorothiazide (DIOVAN-HCT) 320-25 MG per tablet Take 1 tablet by mouth every morning.    [provider]  vitamin B-12 (CYANOCOBALAMIN) 1000 MCG tablet Take 2,000 mcg by mouth every morning.    [provider]  vitamin C (ASCORBIC ACID) 500 MG tablet Take 500 mg by mouth daily.    [provider]    Physical Exam: Vitals:   12/04/20 2223 12/04/20 2330 12/04/20 2335 12/05/20 0030  BP: (!) 99/56 121/61 121/61 (!) 131/55  Pulse: 66  64 66  Resp: (!) 22 19 20 18   Temp:      TempSrc:      SpO2: 100%  94% 93%  Weight:      Height:        Constitutional: NAD, calm  Eyes: PERTLA, lids and conjunctivae normal ENMT: Mucous membranes are moist. Posterior pharynx clear of any exudate or lesions.   Neck: normal, supple, no masses, no thyromegaly Respiratory:  no wheezing, no crackles. No accessory muscle use.  Cardiovascular: S1 & S2 heard, regular rate and rhythm. No extremity edema. No significant JVD. Abdomen: No distension, no tenderness, soft. Bowel sounds active.  Musculoskeletal: no clubbing / cyanosis. No joint deformity upper  and lower extremities.   Skin: no significant rashes, lesions, ulcers. Warm, dry, well-perfused. Neurologic: CN 2-12 grossly intact. Sensation intact. Moving all extremities.  Psychiatric: Alert and oriented to person, place, and situation. Very pleasant and cooperative.    Labs and Imaging on Admission: I have personally reviewed following labs and imaging studies  CBC: Recent Labs  Lab 12/04/20 1737  WBC 19.7*  HGB 10.3*  HCT 31.3*  MCV 83.5  PLT 92*   Basic Metabolic Panel: Recent Labs  Lab 12/04/20 1737  NA 134*  K 3.6  CL 98  CO2 22  GLUCOSE 94  BUN 81*  CREATININE 3.96*  CALCIUM 8.2*   GFR: Estimated Creatinine Clearance: 17.4 mL/min (A) (by C-G formula based on SCr of 3.96 mg/dL (H)).  Liver Function Tests: No results for input(s): AST, ALT, ALKPHOS, BILITOT, PROT, ALBUMIN in the last 168 hours. No results for input(s): LIPASE, AMYLASE in the last 168 hours. No results for input(s): AMMONIA in the last 168 hours. Coagulation Profile: No results for input(s): INR, PROTIME in the last 168 hours. Cardiac Enzymes: No results for input(s): CKTOTAL, CKMB, CKMBINDEX, TROPONINI in the last 168 hours. BNP (last 3 results) No results for input(s): PROBNP in the last 8760 hours. HbA1C: No results for input(s): HGBA1C in the last 72 hours. CBG: No results for input(s): GLUCAP in the last 168 hours. Lipid Profile: No results for input(s): CHOL, HDL, LDLCALC, TRIG, CHOLHDL, LDLDIRECT in the last 72 hours. Thyroid Function Tests: No results for input(s): TSH, T4TOTAL, FREET4, T3FREE, THYROIDAB in the last 72 hours. Anemia Panel: No results for input(s): VITAMINB12, FOLATE, FERRITIN, TIBC, IRON, RETICCTPCT in the last 72 hours. Urine analysis: No results found for: COLORURINE, APPEARANCEUR, LABSPEC, PHURINE, GLUCOSEU, HGBUR, BILIRUBINUR, KETONESUR, PROTEINUR, UROBILINOGEN, NITRITE, LEUKOCYTESUR Sepsis Labs: @LABRCNTIP (procalcitonin:4,lacticidven:4) ) Recent Results  (from the past 240 hour(s))  SARS Coronavirus 2 by RT PCR (hospital order, performed in [facility] hospital lab) Nasopharyngeal Nasopharyngeal Swab     Status: None   Collection Time: 12/05/20 12:26 AM   Specimen: Nasopharyngeal Swab  Result Value Ref Range Status   SARS Coronavirus 2 NEGATIVE NEGATIVE Final    Comment: (NOTE) SARS-CoV-2 target nucleic acids are NOT DETECTED.  The SARS-CoV-2 RNA is generally detectable in upper and lower respiratory specimens during the acute phase of infection. The lowest concentration of SARS-CoV-2 viral copies this assay can detect is 250 copies / mL. A negative result does not preclude SARS-CoV-2 infection and should not be used as the sole basis for treatment or other patient management decisions.  A negative result may occur with improper specimen collection / handling, submission of specimen other than nasopharyngeal swab, presence of viral mutation(s) within the areas targeted by this assay, and inadequate number of viral copies (<250 copies / mL). A negative result must be combined with clinical observations, patient history, and epidemiological information.  Fact Sheet for Patients:   02/02/21  Fact Sheet for Healthcare Providers: BoilerBrush.com.cy  This test is not yet approved or  cleared by the https://pope.com/ FDA and has been authorized for detection and/or diagnosis of SARS-CoV-2 by FDA under an Emergency Use Authorization (EUA).  This EUA will remain in effect (meaning this test can be used) for the duration of the COVID-19 declaration under Section 564(b)(1) of the Act, 21 U.S.C. section 360bbb-3(b)(1), unless the authorization is terminated or revoked sooner.  Performed at [facility], [address] [place], [place]      Radiological Exams on Admission: DG Chest 2 View  Result Date: 12/04/2020 CLINICAL DATA:  Chest pain. EXAM: CHEST - 2 VIEW  COMPARISON:  None. FINDINGS: The heart size and mediastinal contours are within normal limits. Both lungs are clear. No pneumothorax or pleural effusion is noted. The visualized skeletal structures are unremarkable. IMPRESSION: No active cardiopulmonary disease. Aortic Atherosclerosis (ICD10-I70.0). Electronically Signed   By: 12/06/2020 M.D.   On: 12/04/2020 18:09    EKG: Independently reviewed. Sinus rhythm.   Assessment/Plan   1. Chest pain  - Patient presents for evaluation of exertional chest discomfort, SOB, and fatigue  - HS troponin 112 then 104; no acute findings on CXR; EKG with SR and no acute ischemic changes   - She was treated with ASA 324 prior  to arrival  - Continue ASA and beta-blocker, check lipids, check echocardiogram   2. Renal insufficiency  - BUN is 81 and SCr 3.96 on admission, normal in 2013 with no more recent values available  - Patient does not know of any CKD hx and reports having blood work with PCP in October 2021  - She denies NSAID use or N/V/D, takes olmesartan-HCTZ and Lasix BID  - Obtain PCP records, hold olmeartan-HCTZ and Lasix, check UA, FEUrea, renal US, hydrate with IVF overnight, renally-dose medications, and repeat chem panel in am    3. Leukocytosis; anemia; thrombocytopenia  - WBC is 19,700 on admission without fever or apparent infectious process  - Hgb is 10.3 with normal MCV and no overt bleeding   - Platelets 92k without apparent infection or bleeding  - Check differential, anemia panel, smear review, culture if febrile, check LFTs, and monitor   4. Hypothyroidism  - Check TSH, continue Synthroid   5. Hypertension  - BP low-normal in ED  - Hold olmesartan-HCTZ and Lasix, continue Coreg as tolerated    6. Low back pain  - Midline low back pain present for several days  - No weakness, incontinence, or paresthesias  - Check plain radiographs    DVT prophylaxis: SCDs  Code Status: Full  Level of Care: Level of care: Telemetry  Cardiac Family Communication: Discussed with patient  Disposition Plan:  Patient is from: Home  Anticipated d/c is to: TBD Anticipated d/c date is: 12/08/20 Patient currently: Pending workup of renal insufficiency, exertional chest pain with elevated troponins, thrombocytopenia   Consults called: Cardiology  Admission status: Inpatient     Briscoe Deutscher, MD [facility]  12/05/2020, 1:55 AM

## 2020-12-05 NOTE — Progress Notes (Signed)
PROGRESS NOTE    Cassidy Austin  MKL:[id number] DOB: 1951-05-06 DOA: 12/04/2020 PCP: Laurann Montana, MD   Brief Narrative:  Cassidy Austin is a 70 y.o. female with medical history significant for hypertension, hypothyroidism, and chronic bilateral leg swelling, now presenting to the emergency department for evaluation of chest pain, shortness of breath, and fatigue.  Patient has had decreased activity tolerance, exertional dyspnea, and increased fatigue over the past 4 days, and also had some chest discomfort with exertion over the past few days that was mild and quickly resolved with rest.  Yesterday (12/04/2020) she was doing some light housework when she became short of breath and had more intense chest discomfort.  She began to feel better within several minutes of sitting down but had recurrent symptoms when she attempted to resume activity.  She denies any recent cough, fevers, increased leg swelling, or orthopnea.  Denies any melena or hematochezia.  Reports that she had some blood work performed in October 2021 with her PCP which she believes was fairly normal.  She was taken off iron supplements recently.  She has naproxen on her medication list but does not use it.  She noticed some gross hematuria yesterday without any dysuria or flank pain though she has recently had some midline low back pain without radiation.  She takes Lasix twice a day for chronic leg swelling. Upon arrival to the ED, patient is found to be afebrile, saturating mid 90s on room air, and with systolic blood pressure 100.  EKG features a sinus rhythm and chest x-ray negative for acute cardiopulmonary disease.  Chemistry panel notable for BUN 81 and creatinine 3.96.  CBC is notable for leukocytosis to 19,700, hemoglobin 10.3, and platelets 92,000.  High-sensitivity troponin was 112 and then 104.  COVID-19 screening test is pending.  Cardiology was consulted by the ED physician and recommended medical admission.  Assessment &  Plan:   Principal Problem:   Chest pain Active Problems:   Hypothyroidism   Hypertension   Hypercholesterolemia   Renal insufficiency   Normocytic anemia   Thrombocytopenia (HCC)   Leukocytosis   Atypical chest pain/pressure  - Patient presents for evaluation of exertional chest discomfort/tightness/pressure/SOB/fatigue  - HS troponin 112 then 104; no acute findings on CXR; EKG with SR and no acute ischemic changes   - She was treated with ASA 324 prior to arrival  - Continue ASA and beta-blocker, check lipids, check echocardiogram - EF 60-65% with grade 1 diastolic dysfunction  AKI secondary to poor PO intake, improving - BUN is 81 and SCr 3.96 on admission, normal in 2013 with no more recent values available  Lab Results  Component Value Date   CREATININE 3.76 (H) 12/05/2020   CREATININE 3.96 (H) 12/04/2020   CREATININE 0.68 01/24/2012  - Patient takes olmesartan-HCTZ and Lasix BID  - Hold nephrotoxins and continue increased PO intake free water; hold IVF until cardiology evaluates echo - avoid volume overload  Leukocytosis, likely reactive, rule out infection - Questionably hemoconentration  Anemia, questionably chronic anemia of chronic disease Concurrent thrombocytopenia  - Hgb is 10.3 with normal MCV and no overt bleeding   - Platelets 92k without overt bleeding  - Check differential, anemia panel, smear review, culture if febrile, check LFTs, and monitor   Hypothyroidism  - TSH WNL, continue Synthroid   Hypertension, essential - BP low-normal in ED  - Hold olmesartan-HCTZ and Lasix, continue Coreg as tolerated    Chronic low back pain  - Midline low back pain present  for several days - reports she is currently moving to new home; likely musculoskeletal strain - No weakness, incontinence, or paresthesias  - No acute osseous abnormality on plain radiographs    DVT prophylaxis: SCDs  Code Status: Full  Family Communication: None present  Status is:  Inpt  Dispo: The patient is from: Home              Anticipated d/c is to: Home              Anticipated d/c date is: 48-72h              Patient currently NOT medically stable for discharge  Consultants:   Cardiology  Procedures:   None - possible nuclear stress per cardiology  Antimicrobials:  None   Subjective: No acute issues/events overnight - still fatigued/weak but minimally improved. Denies chest pain, nausea, or vomiting  Objective: Vitals:   12/04/20 2335 12/05/20 0030 12/05/20 0420 12/05/20 0730  BP: 121/61 (!) 131/55 (!) 122/48 (!) 96/54  Pulse: 64 66 71 77  Resp: 20 18 20  (!) 24  Temp:    97.8 F (36.6 C)  TempSrc:    Oral  SpO2: 94% 93% 95% 98%  Weight:      Height:       No intake or output data in the 24 hours ending 12/05/20 0800 Filed Weights   12/04/20 1732  Weight: 122.5 kg    Examination:  General exam: Appears calm and comfortable  Respiratory system: Clear to auscultation. Respiratory effort normal. Cardiovascular system: S1 & S2 heard, RRR. No JVD, murmurs, rubs, gallops or clicks. No pedal edema. Gastrointestinal system: Abdomen is nondistended, soft and nontender. No organomegaly or masses felt. Normal bowel sounds heard. Central nervous system: Alert and oriented. No focal neurological deficits. Extremities: Symmetric 5 x 5 power. Skin: No rashes, lesions or ulcers Psychiatry: Judgement and insight appear normal. Mood & affect appropriate.     Data Reviewed: I have personally reviewed following labs and imaging studies  CBC: Recent Labs  Lab 12/04/20 1737 12/05/20 0424  WBC 19.7* 16.6*  NEUTROABS  --  15.0*  HGB 10.3* 10.5*  HCT 31.3* 31.5*  MCV 83.5 85.1  PLT 92* 108*   Basic Metabolic Panel: Recent Labs  Lab 12/04/20 1737 12/05/20 0424  NA 134* 135  K 3.6 3.5  CL 98 100  CO2 22 22  GLUCOSE 94 144*  BUN 81* 93*  CREATININE 3.96* 3.76*  CALCIUM 8.2* 8.0*  MG  --  2.7*  PHOS  --  4.0   GFR: Estimated  Creatinine Clearance: 18.3 mL/min (A) (by C-G formula based on SCr of 3.76 mg/dL (H)). Liver Function Tests: Recent Labs  Lab 12/05/20 0424  AST 58*  ALT 56*  ALKPHOS 74  BILITOT 1.3*  PROT 6.6  ALBUMIN 2.4*   No results for input(s): LIPASE, AMYLASE in the last 168 hours. No results for input(s): AMMONIA in the last 168 hours. Coagulation Profile: No results for input(s): INR, PROTIME in the last 168 hours. Cardiac Enzymes: No results for input(s): CKTOTAL, CKMB, CKMBINDEX, TROPONINI in the last 168 hours. BNP (last 3 results) No results for input(s): PROBNP in the last 8760 hours. HbA1C: No results for input(s): HGBA1C in the last 72 hours. CBG: No results for input(s): GLUCAP in the last 168 hours. Lipid Profile: Recent Labs    12/05/20 0424  CHOL 171  HDL 14*  LDLCALC 116*  TRIG 205*  CHOLHDL 12.2   Thyroid Function  Tests: Recent Labs    12/05/20 0424  TSH 2.088   Anemia Panel: Recent Labs    12/05/20 0424  VITAMINB12 651  FOLATE 22.5  FERRITIN 1,428*  TIBC 154*  IRON 47  RETICCTPCT 1.2   Sepsis Labs: No results for input(s): PROCALCITON, LATICACIDVEN in the last 168 hours.  Recent Results (from the past 240 hour(s))  SARS Coronavirus 2 by RT PCR (hospital order, performed in [facility] hospital lab) Nasopharyngeal Nasopharyngeal Swab     Status: None   Collection Time: 12/05/20 12:26 AM   Specimen: Nasopharyngeal Swab  Result Value Ref Range Status   SARS Coronavirus 2 NEGATIVE NEGATIVE Final    Comment: (NOTE) SARS-CoV-2 target nucleic acids are NOT DETECTED.  The SARS-CoV-2 RNA is generally detectable in upper and lower respiratory specimens during the acute phase of infection. The lowest concentration of SARS-CoV-2 viral copies this assay can detect is 250 copies / mL. A negative result does not preclude SARS-CoV-2 infection and should not be used as the sole basis for treatment or other patient management decisions.  A negative result may  occur with improper specimen collection / handling, submission of specimen other than nasopharyngeal swab, presence of viral mutation(s) within the areas targeted by this assay, and inadequate number of viral copies (<250 copies / mL). A negative result must be combined with clinical observations, patient history, and epidemiological information.  Fact Sheet for Patients:   BoilerBrush.com.cy  Fact Sheet for Healthcare Providers: https://pope.com/  This test is not yet approved or  cleared by the [place] FDA and has been authorized for detection and/or diagnosis of SARS-CoV-2 by FDA under an Emergency Use Authorization (EUA).  This EUA will remain in effect (meaning this test can be used) for the duration of the COVID-19 declaration under Section 564(b)(1) of the Act, 21 U.S.C. section 360bbb-3(b)(1), unless the authorization is terminated or revoked sooner.  Performed at [facility], [address], [place], [place] [address]          Radiology Studies: DG Chest 2 View  Result Date: 12/04/2020 CLINICAL DATA:  Chest pain. EXAM: CHEST - 2 VIEW COMPARISON:  None. FINDINGS: The heart size and mediastinal contours are within normal limits. Both lungs are clear. No pneumothorax or pleural effusion is noted. The visualized skeletal structures are unremarkable. IMPRESSION: No active cardiopulmonary disease. Aortic Atherosclerosis (ICD10-I70.0). Electronically Signed   By: Lupita Raider M.D.   On: 12/04/2020 18:09   DG Lumbar Spine 2-3 Views  Result Date: 12/05/2020 CLINICAL DATA:  Initially presented with chest pain now with low back pain EXAM: LUMBAR SPINE - 2-3 VIEW COMPARISON:  None. FINDINGS: Five lumbar type vertebral levels. Dextrocurvature of the lumbar spine with an apex at the L3 level. There is grade 1 anterolisthesis L3 on 4, L4 on 5 with what are likely L3 pars defects, age indeterminate. Findings on a background  of diffuse intervertebral disc height loss with multilevel discogenic and facet degenerative changes well as interspinous arthrosis compatible with Baastrup's disease. Normal bone mineralization. No conspicuous lytic or blastic lesions are seen. Included portions of the bony pelvis of appear grossly intact and congruent albeit with degenerative changes in the imaged hips and SI joints. Normal bowel gas pattern. Atherosclerotic calcification of the aorta. Remaining soft tissues are unremarkable. IMPRESSION: 1. 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 Correlate for point tenderness. 2. No other acute osseous abnormality or traumatic malalignment. 3. Background  of moderate multilevel discogenic and facet degenerative changes of the lumbar spine. Electronically Signed   By: Kreg Shropshire M.D.   On: 12/05/2020 02:49   US RENAL  Result Date: 12/05/2020 CLINICAL DATA:  Renal insufficiency EXAM: RENAL / URINARY TRACT ULTRASOUND COMPLETE COMPARISON:  None. FINDINGS: Right Kidney: Renal measurements: 13.6 x 6.7 x 6.2 = volume: 297.7 mL. Suboptimal visualization with extensive rib shadowing and partial obscuration of portions of the renal parenchyma. No visible lesion, shadowing calculus or hydronephrosis. Cortical echogenicity is grossly normal. Left Kidney: Renal measurements: 10.7 x 6.1 x 6.0 = volume: 205.6 mL. Suboptimal visualization with extensive rib shadowing and partial obscuration of portions of the renal parenchyma. No visible lesion, shadowing calculus or hydronephrosis. Cortical echogenicity is grossly normal. Bladder: Bladder partially decompressed with some mild trabeculation. A small 2.3 x 1.9 x 3.7 cm bladder diverticulum is noted along the right lateral aspect of the urinary bladder. Other: Markedly suboptimal assessment of the bilateral kidneys due to a combination of body habitus, bowel gas, poor sonographic windowing and inability to lay flat or  reposition in decubitus. IMPRESSION: 1. Suboptimal visualization of the kidneys due to body habitus, rib shadowing and inability to lay flat or reposition in decubitus position. 2. No gross renal abnormality is seen though portions of renal parenchyma incompletely visualized. 3. Mild trabeculation of the bladder with small 3.7 cm bladder diverticulum. Correlate with urinalysis to exclude superimposed cystitis. Electronically Signed   By: Kreg Shropshire M.D.   On: 12/05/2020 03:26        Scheduled Meds: . [START ON 12/06/2020] aspirin EC  81 mg Oral Daily  . carvedilol  12.5 mg Oral BID WC  . levothyroxine  100 mcg Oral Q0600   Continuous Infusions: . sodium chloride 100 mL/hr at 12/05/20 0333     LOS: 0 days    Time spent:    Azucena Fallen, DO [facility]  If 7PM-7AM, please contact night-coverage [email or web address]  12/05/2020, 8:00 AM

## 2020-12-05 NOTE — ED Provider Notes (Signed)
[facility] EMERGENCY DEPARTMENT Provider Note   CSN: [id number] Arrival date & time: 12/04/20  1730     History Chief Complaint  Patient presents with  . Chest Pain    Cassidy Austin is a 70 y.o. female past medical history of hypertension, hyperlipidemia, hypothyroidism, presenting to the emergency department with complaint of chest pressure.  Patient states around noon today she was physically exerting herself around the house as she is preparing for a move.  She states she began feeling very weak and fatigued.  She had chest pressure.  She sat down to rest because she felt as though she could no longer continue.  Rest improved her pressure in her chest.  She states when she went to stand up however she was too weak.  She states she slid her bottom too far to the edge of the seat and slid down onto the floor.  She did not hit her head or lose consciousness.  She called EMS because she was unable to get up.  She states EMS gave her 4 tabs of aspirin. She does take 81 mg in the morning.  She treated her blood pressure this morning, is due for her evening meds.  She denies any cardiac history.  She is followed by Deboraha Sprang for PCP.  States she last had her screening blood work done in October and she had normal renal function.   Denies new lower extremity swelling though reports chronic swelling.  Denies diaphoresis or nausea.  States over the last month or so she has been feeling generalized fatigue though nothing as significant as today.  She states today when she urinated she also noted some blood in her urine which was abnormal.  The history is provided by the patient.       Past Medical History:  Diagnosis Date  . Complication of anesthesia    hard to wake up after eye surgery in 1970s  . Goiter    with thyroid nodule, FNA 12/15 benign follicular nodule  . Hypercholesterolemia   . Hypertension   . Hypothyroidism   . OA (osteoarthritis) of knee    mod.     Patient Active Problem List   Diagnosis Date Noted  . Chest pain 12/05/2020  . Renal insufficiency 12/05/2020  . Normochromic anemia 12/05/2020  . Thrombocytopenia (HCC) 12/05/2020  . Leukocytosis 12/05/2020  . Hypothyroidism   . Hypertension   . Complication of anesthesia   . OA (osteoarthritis) of knee   . Hypercholesterolemia   . Goiter     Past Surgical History:  Procedure Laterality Date  . COLONOSCOPY    . EYE SURGERY    . THYROID SURGERY  1999     OB History   No obstetric history on file.     Family History  Problem Relation Age of Onset  . Heart failure Mother   . Breast cancer Neg Hx     Social History   Tobacco Use  . Smoking status: Never Smoker  . Smokeless tobacco: Never Used  Substance Use Topics  . Alcohol use: No  . Drug use: No    Home Medications Prior to Admission medications   Medication Sig Start Date End Date Taking? Authorizing Provider  aspirin 81 MG chewable tablet Chew 81 mg by mouth every morning.    [provider]  Calcium Carbonate (CALCIUM 500 PO) Take 1,000 mg by mouth 2 (two) times daily.    [provider]  carvedilol (COREG) 6.25 MG tablet  Take 6.25 mg by mouth 2 (two) times daily. 11/19/17   [provider]  cholecalciferol (VITAMIN D) 1000 UNITS tablet Take 1,000 Units by mouth 2 (two) times daily.    [provider]  ferrous sulfate 325 (65 FE) MG tablet Take 325 mg by mouth daily. 10/20/17   [provider]  fish oil-omega-3 fatty acids 1000 MG capsule Take 1 g by mouth every morning.    [provider]  furosemide (LASIX) 40 MG tablet Take 40 mg by mouth daily. 09/18/16   [provider]  Garlic (GARLIQUE) 400 MG TBEC Take 1 tablet by mouth every morning.    [provider]  HYDROcodone-acetaminophen (VICODIN) 5-500 MG per tablet 1-2 po q 6 hrs prn 01/30/12   Gerald Leitz, MD  KLOR-CON M20 20 MEQ tablet Take 20 mEq by mouth daily. 09/30/17    [provider]  levothyroxine (SYNTHROID, LEVOTHROID) 100 MCG tablet Take 100 mcg by mouth daily at 6 (six) AM.    [provider]  Magnesium 250 MG TABS Take 500 mg by mouth every morning.    [provider]  naproxen (NAPROSYN) 500 MG tablet Take 1 tablet by mouth 2 (two) times daily as needed. 09/02/16   [provider]  olmesartan-hydrochlorothiazide (BENICAR HCT) 40-25 MG tablet Take 1 tablet by mouth daily. 10/11/17   [provider]  Red Yeast Rice Extract (RED YEAST RICE PO) Take 1 tablet by mouth 2 (two) times daily.    [provider]  tetrahydrozoline 0.05 % ophthalmic solution Apply 1 drop to eye as needed. Visine for dry eyes    [provider]  TURMERIC PO Take 2 tablets by mouth daily.    [provider]  valsartan-hydrochlorothiazide (DIOVAN-HCT) 320-25 MG per tablet Take 1 tablet by mouth every morning.    [provider]  vitamin B-12 (CYANOCOBALAMIN) 1000 MCG tablet Take 2,000 mcg by mouth every morning.    [provider]  vitamin C (ASCORBIC ACID) 500 MG tablet Take 500 mg by mouth daily.    [provider]    Allergies    Patient has no known allergies.  Review of Systems   Review of Systems  Constitutional: Positive for fatigue.  Cardiovascular: Positive for chest pain.  Neurological: Positive for weakness (generalized).  All other systems reviewed and are negative.   Physical Exam Updated Vital Signs BP (!) 131/55 (BP Location: Right Arm)   Pulse 66   Temp 98.4 F (36.9 C) (Oral)   Resp 18   Ht 5\' 6"  (1.676 m)   Wt 122.5 kg   SpO2 93%   BMI 43.58 kg/m   Physical Exam Vitals and nursing note reviewed.  Constitutional:      General: She is not in acute distress.    Appearance: She is well-developed and well-nourished.  HENT:     Head: Normocephalic and atraumatic.  Eyes:     Conjunctiva/sclera: Conjunctivae normal.  Cardiovascular:     Rate and  Rhythm: Normal rate and regular rhythm.     Pulses: Normal pulses.     Heart sounds: Normal heart sounds.  Pulmonary:     Effort: Pulmonary effort is normal. No respiratory distress.     Breath sounds: Normal breath sounds.  Abdominal:     General: Bowel sounds are normal.     Palpations: Abdomen is soft.     Tenderness: There is no abdominal tenderness.  Musculoskeletal:     Right lower leg: Edema present.  Left lower leg: Edema present.  Skin:    General: Skin is warm.  Neurological:     Mental Status: She is alert.  Psychiatric:        Mood and Affect: Mood and affect normal.        Behavior: Behavior normal.     ED Results / Procedures / Treatments   Labs (all labs ordered are listed, but only abnormal results are displayed) Labs Reviewed  BASIC METABOLIC PANEL - Abnormal; Notable for the following components:      Result Value   Sodium 134 (*)    BUN 81 (*)    Creatinine, Ser 3.96 (*)    Calcium 8.2 (*)    GFR, Estimated 12 (*)    All other components within normal limits  CBC - Abnormal; Notable for the following components:   WBC 19.7 (*)    RBC 3.75 (*)    Hemoglobin 10.3 (*)    HCT 31.3 (*)    RDW 15.7 (*)    Platelets 92 (*)    All other components within normal limits  TROPONIN I (HIGH SENSITIVITY) - Abnormal; Notable for the following components:   Troponin I (High Sensitivity) 112 (*)    All other components within normal limits  TROPONIN I (HIGH SENSITIVITY) - Abnormal; Notable for the following components:   Troponin I (High Sensitivity) 104 (*)    All other components within normal limits  SARS CORONAVIRUS 2 BY RT PCR (HOSPITAL ORDER, PERFORMED IN [facility])  URINALYSIS, COMPLETE (UACMP) WITH MICROSCOPIC  SODIUM, URINE, RANDOM  CREATININE, URINE, RANDOM  UREA NITROGEN, URINE  COMPREHENSIVE METABOLIC PANEL  MAGNESIUM  PHOSPHORUS  CBC WITH DIFFERENTIAL/PLATELET  VITAMIN B12  FOLATE  IRON AND TIBC  FERRITIN  RETICULOCYTES   LIPID PANEL  TSH  PROTEIN ELECTROPHORESIS, SERUM  SAVE SMEAR (SSMR)  PATHOLOGIST SMEAR REVIEW  LACTATE DEHYDROGENASE    EKG EKG Interpretation  Date/Time:  Monday December 04 2020 17:36:24 EST Ventricular Rate:  74 PR Interval:  132 QRS Duration: 88 QT Interval:  386 QTC Calculation: 428 R Axis:   31 Text Interpretation: Normal sinus rhythm Anterior infarct , age undetermined Abnormal ECG Confirmed by Kennis Carina [phone]) on 12/05/2020 12:23:34 AM   Radiology DG Chest 2 View  Result Date: 12/04/2020 CLINICAL DATA:  Chest pain. EXAM: CHEST - 2 VIEW COMPARISON:  None. FINDINGS: The heart size and mediastinal contours are within normal limits. Both lungs are clear. No pneumothorax or pleural effusion is noted. The visualized skeletal structures are unremarkable. IMPRESSION: No active cardiopulmonary disease. Aortic Atherosclerosis (ICD10-I70.0). Electronically Signed   By: Lupita Raider M.D.   On: 12/04/2020 18:09    Procedures Procedures   Medications Ordered in ED Medications  aspirin EC tablet 81 mg (has no administration in time range)  nitroGLYCERIN (NITROSTAT) SL tablet 0.4 mg (has no administration in time range)  acetaminophen (TYLENOL) tablet 650 mg (has no administration in time range)  ondansetron (ZOFRAN) injection 4 mg (has no administration in time range)  levothyroxine (SYNTHROID) tablet 100 mcg (has no administration in time range)  carvedilol (COREG) tablet 12.5 mg (has no administration in time range)    ED Course  I have reviewed the triage vital signs and the nursing notes.  Pertinent labs & imaging results that were available during my care of the patient were reviewed by me and considered in my medical decision making (see chart for details).  Clinical Course as of 12/05/20 0216  Tue  Dec 05, 2020  0040 Discussed with Cardiologist. Recommends medical admission, will see in consult. [JR]  0112 Dr. Antionette Char accepting admission [JR]    Clinical Course  User Index [JR] Annleigh Knueppel, [place] N, PA-C   MDM Rules/Calculators/A&P                          Patient presenting with complaints of chest pressure and extreme generalized weakness and fatigue that began around noon this afternoon while exerting herself around the house as she is preparing for a move.  Chest pressure improved with resting, however she continues to feel very weak.  EMS administered aspirin in route.  She has no known cardiac history.  Initial troponin is elevated to 112, repeat is 104.  ECG  With new q waves.  Presentation is concerning for ACS, NSTEMI.  Consult placed to cardiology.  She also is noted to have acute renal insufficiency.  Creatinine is 3.96 with BUN of 81.  Bicarb is 22 and potassium is 3.6.  She reports no known history of this, states her PCP checked her blood work in October and she had normal renal function to her knowledge.  No recent to compare to in the medical record.  She is also noted to have a leukocytosis of 19.7.  Chest x-ray is clear.    Cardiology recommends medical admission.  Will evaluate in consultation.  Patient will require admission for acute renal insufficiency and NSTEMI.  Dr. Antionette Char accepting admission.  Final Clinical Impression(s) / ED Diagnoses Final diagnoses:  NSTEMI (non-ST elevated myocardial infarction) [facility])    Rx / DC Orders ED Discharge Orders    None       Era Parr, [place] N, PA-C 12/05/20 0216    Sabas Sous, MD 12/05/20 [phone]

## 2020-12-05 NOTE — Progress Notes (Signed)
Patient arrived to room in NAD, VS stable and patient free from pain. Patient oriented to room and call bell in reach.  

## 2020-12-06 ENCOUNTER — Inpatient Hospital Stay (HOSPITAL_COMMUNITY): Payer: Medicare PPO

## 2020-12-06 LAB — CBC
HCT: 25.4 % — ABNORMAL LOW (ref 36.0–46.0)
Hemoglobin: 9.2 g/dL — ABNORMAL LOW (ref 12.0–15.0)
MCH: 29.2 pg (ref 26.0–34.0)
MCHC: 36.2 g/dL — ABNORMAL HIGH (ref 30.0–36.0)
MCV: 80.6 fL (ref 80.0–100.0)
Platelets: 98 10*3/uL — ABNORMAL LOW (ref 150–400)
RBC: 3.15 MIL/uL — ABNORMAL LOW (ref 3.87–5.11)
RDW: 15 % (ref 11.5–15.5)
WBC: 12.4 10*3/uL — ABNORMAL HIGH (ref 4.0–10.5)
nRBC: 0.2 % (ref 0.0–0.2)

## 2020-12-06 LAB — BASIC METABOLIC PANEL
Anion gap: 13 (ref 5–15)
BUN: 93 mg/dL — ABNORMAL HIGH (ref 8–23)
CO2: 22 mmol/L (ref 22–32)
Calcium: 7.6 mg/dL — ABNORMAL LOW (ref 8.9–10.3)
Chloride: 100 mmol/L (ref 98–111)
Creatinine, Ser: 3.21 mg/dL — ABNORMAL HIGH (ref 0.44–1.00)
GFR, Estimated: 15 mL/min — ABNORMAL LOW (ref >60)
Glucose, Bld: 138 mg/dL — ABNORMAL HIGH (ref 70–99)
Potassium: 3.5 mmol/L (ref 3.5–5.1)
Sodium: 135 mmol/L (ref 135–145)

## 2020-12-06 LAB — URINALYSIS, ROUTINE W REFLEX MICROSCOPIC
Bilirubin Urine: NEGATIVE
Glucose, UA: NEGATIVE mg/dL
Ketones, ur: NEGATIVE mg/dL
Nitrite: NEGATIVE
Protein, ur: 100 mg/dL — AB
Specific Gravity, Urine: 1.015 (ref 1.005–1.030)
pH: 7 (ref 5.0–8.0)

## 2020-12-06 LAB — URINALYSIS, MICROSCOPIC (REFLEX)

## 2020-12-06 MED ORDER — SODIUM CHLORIDE 0.9 % IV SOLN: INTRAVENOUS | Status: DC

## 2020-12-06 MED ORDER — SODIUM CHLORIDE 0.9 % IV SOLN
1.0000 g | INTRAVENOUS | Status: DC
  Administered 2020-12-06 – 2020-12-07 (×2): 1 g via INTRAVENOUS
  Filled 2020-12-06: qty 1
  Filled 2020-12-06: qty 10
  Filled 2020-12-06: qty 1

## 2020-12-06 MED ORDER — EZETIMIBE 10 MG PO TABS
10.0000 mg | ORAL_TABLET | Freq: Every day | ORAL | Status: DC
  Administered 2020-12-06 – 2020-12-13 (×8): 10 mg via ORAL
  Filled 2020-12-06 (×8): qty 1

## 2020-12-06 MED ORDER — SODIUM CHLORIDE 0.9 % IV BOLUS
500.0000 mL | Freq: Once | INTRAVENOUS | Status: AC
  Administered 2020-12-06: 500 mL via INTRAVENOUS

## 2020-12-06 NOTE — TOC Initial Note (Signed)
Transition of Care [facility]) - Initial/Assessment Note    Patient Details  Name: Cassidy Austin MRN: [id number] Date of Birth: [birth year]  [facility] CM/SW Contact:    Jimmy Picket, LCSWA Phone Number: 12/06/2020, 4:33 PM  Clinical Narrative:                  CSW spoke to pt and nephew at bedside. CSW explained role at the hospital . Pt reports PTA she was living at home alone and was completely independent with mobility and ADL's. Pt reports she was still driving and doing everything for herself. Pt reports she is covid vaccinated and had the booster.   CSW discussed pt/ot reccs for SNF. Pt reports she has not been to SNF in the past but may be open to it. Pt gave csw to fax out to facilities in the area. CSW gave medicare.gov rating scale for pt and nephew to review.  TOC will follow.   Expected Discharge Plan: Skilled Nursing Facility Barriers to Discharge: Insurance Authorization,Continued Medical Work up   Patient Goals and CMS Choice Patient states their goals for this hospitalization and ongoing recovery are:: to get better to return home CMS Medicare.gov Compare Post Acute Care list provided to:: Patient Choice offered to / list presented to : Patient  Expected Discharge Plan and Services Expected Discharge Plan: Skilled Nursing Facility       Living arrangements for the past 2 months: Single Family Home                                      Prior Living Arrangements/Services Living arrangements for the past 2 months: Single Family Home Lives with:: Self Patient language and need for interpreter reviewed:: Yes Do you feel safe going back to the place where you live?: Yes      Need for Family Participation in Patient Care: Yes (Comment) Care giver support system in place?: Yes (comment)   Criminal Activity/Legal Involvement Pertinent to Current Situation/Hospitalization: No - Comment as needed  Activities of Daily Living Home Assistive  Devices/Equipment: Eyeglasses ADL Screening (condition at time of admission) Patient's cognitive ability adequate to safely complete daily activities?: Yes Is the patient deaf or have difficulty hearing?: No Does the patient have difficulty seeing, even when wearing glasses/contacts?: No Does the patient have difficulty concentrating, remembering, or making decisions?: Yes Patient able to express need for assistance with ADLs?: Yes Does the patient have difficulty dressing or bathing?: No Independently performs ADLs?: Yes (appropriate for developmental age) Does the patient have difficulty walking or climbing stairs?: Yes Weakness of Legs: Both Weakness of Arms/Hands: None  Permission Sought/Granted Permission sought to share information with : Family Electrical engineer Permission granted to share information with : Yes, Verbal Permission Granted     Permission granted to share info w AGENCY: SNF  Permission granted to share info w Relationship: Nephew     Emotional Assessment Appearance:: Appears stated age Attitude/Demeanor/Rapport: Engaged Affect (typically observed): Appropriate,Accepting Orientation: : Oriented to Self,Oriented to Place,Oriented to  Time,Oriented to Situation Alcohol / Substance Use: Not Applicable Psych Involvement: No (comment)  Admission diagnosis:  Low back pain [M54.50] Acute renal insufficiency [N28.9] Renal insufficiency [N28.9] NSTEMI (non-ST elevated myocardial infarction) [facility]) [I21.4] Patient Active Problem List   Diagnosis Date Noted  . Chest pain 12/05/2020  . Renal insufficiency 12/05/2020  . Normocytic anemia 12/05/2020  . Thrombocytopenia (HCC) 12/05/2020  .  Leukocytosis 12/05/2020  . Acute renal insufficiency   . Low back pain   . Hypothyroidism   . Hypertension   . Complication of anesthesia   . OA (osteoarthritis) of knee   . Hypercholesterolemia   . Goiter    PCP:  Laurann Montana, MD Pharmacy:    [facility] [address], [place] - [address] [place] [address] Phone: [phone] Fax: [phone]  [id number] [place], [place] - [address] [place] [address] Phone: [phone] Fax: [phone]     Social Determinants of Health (SDOH) Interventions    Readmission Risk Interventions No flowsheet data found.  Jimmy Picket, Theresia Majors, [place] Clinical Social Worker [phone]

## 2020-12-06 NOTE — NC FL2 (Signed)
[place] MEDICAID FL2 LEVEL OF CARE SCREENING TOOL     IDENTIFICATION  Patient Name: Cassidy Austin Birthdate: Nov 19, 1950 Sex: female Admission Date (Current Location): 12/04/2020  [facility] and [place] Number:  Producer, television/film/video and Address:  [facility] [place], [address] [place], [place] [address]      Provider Number: [id number]  Attending Physician Name and Address:  Alberteen Sam, *  Relative Name and Phone Number:       Current Level of Care: Hospital Recommended Level of Care: Skilled Nursing Facility Prior Approval Number:    Date Approved/Denied:   PASRR Number: [id number]  Discharge Plan: SNF    Current Diagnoses: Patient Active Problem List   Diagnosis Date Noted  . Chest pain 12/05/2020  . Renal insufficiency 12/05/2020  . Normocytic anemia 12/05/2020  . Thrombocytopenia (HCC) 12/05/2020  . Leukocytosis 12/05/2020  . Acute renal insufficiency   . Low back pain   . Hypothyroidism   . Hypertension   . Complication of anesthesia   . OA (osteoarthritis) of knee   . Hypercholesterolemia   . Goiter     Orientation RESPIRATION BLADDER Height & Weight     Self,Time,Situation,Place  Normal Continent Weight: 280 lb 10.3 oz (127.3 kg) Height:  5\' 6"  (167.6 cm)  BEHAVIORAL SYMPTOMS/MOOD NEUROLOGICAL BOWEL NUTRITION STATUS      Continent Diet (See discharge summary)  AMBULATORY STATUS COMMUNICATION OF NEEDS Skin   Extensive Assist Verbally Normal                       Personal Care Assistance Level of Assistance  Bathing,Feeding,Dressing Bathing Assistance: Maximum assistance Feeding assistance: Independent Dressing Assistance: Maximum assistance     Functional Limitations Info  Sight,Hearing,Speech Sight Info: Adequate Hearing Info: Adequate Speech Info: Adequate    SPECIAL CARE FACTORS FREQUENCY  PT (By licensed PT),OT (By licensed OT)     PT Frequency: 5x a week OT Frequency: 5x a week             Contractures Contractures Info: Not present    Additional Factors Info  Code Status,Allergies Code Status Info: Full Allergies Info: NKA           Current Medications (12/06/2020):  This is the current hospital active medication list Current Facility-Administered Medications  Medication Dose Route Frequency Provider Last Rate Last Admin  . 0.9 %  sodium chloride infusion   Intravenous Continuous 02/03/2021, MD 100 mL/hr at 12/06/20 1248 New Bag at 12/06/20 1248  . acetaminophen (TYLENOL) tablet 650 mg  650 mg Oral Q4H PRN 02/03/21, MD   650 mg at 12/06/20 0556  . aspirin EC tablet 81 mg  81 mg Oral Daily Opyd, 02/03/21, MD   81 mg at 12/06/20 0908  . carvedilol (COREG) tablet 12.5 mg  12.5 mg Oral BID WC Opyd, 02/03/21, MD   12.5 mg at 12/06/20 0908  . cefTRIAXone (ROCEPHIN) 1 g in sodium chloride 0.9 % 100 mL IVPB  1 g Intravenous Q24H 02/03/21, MD   Stopped at 12/06/20 1320  . ezetimibe (ZETIA) tablet 10 mg  10 mg Oral Daily [place], 02/03/21, MD   10 mg at 12/06/20 1416  . levothyroxine (SYNTHROID) tablet 100 mcg  100 mcg Oral Q0600 02/03/21, MD   100 mcg at 12/06/20 0547  . nitroGLYCERIN (NITROSTAT) SL tablet 0.4 mg  0.4 mg Sublingual Q5 Min x 3 PRN Opyd, 02/03/21  S, MD      . ondansetron (ZOFRAN) injection 4 mg  4 mg Intravenous Q6H PRN Opyd, Lavone Neri, MD         Discharge Medications: Please see discharge summary for a list of discharge medications.  Relevant Imaging Results:  Relevant Lab Results:   Additional Information SSN; [id number]  Jimmy Picket, [place]

## 2020-12-06 NOTE — Progress Notes (Signed)
[facility] PROGRESS NOTE    Faustina Gebert  HDQ:[id number] DOB: 1950-12-29 DOA: 12/04/2020 PCP: Harlan Stains, MD      Brief Narrative:  Mrs. Nevers is a 70 y.o. F with HTN, hypothyroidism, venous insufficiency, and MO who presented with hematuria, fatigue/weakness, and chest pain, over 1-2 days.  Patient was in her usual state of health until about a day or so before presentation, when she developed fatigue/generalized weakness.  She had an episode of hematuria and an episode of chest pain so she came to the ER.  In the ER, troponin was low and flat.  EKG showed no ischemic changes.  Chest x-ray clear.  Urinalysis had pyuria, she had tachypnea, soft blood pressure, platelets 92 K, WBC 19K.  Creatinine 3.96 from baseline <1 in 2013. Cardiology were consulted for possible ACS and she was admitted to the hospital service.          Assessment & Plan:  Acute renal failure Creatinine 3.96 on admission, slightly better today just with oral rehydration.  This occurred in the setting of soft blood pressure, as well as Lasix and olmesartan use.  It may simply be ischemic injury in that setting.  Ultrasound ruled out obstruction.  Given anemia and thrombocytopenia, MM should be considered.  -Start aggressive IV fluids -Strict intake and output -Obtain SPEP and free light chains -Trend BMP  -Hold Lasix and olmesartan    Severe sepsis due to UTI Patient met sepsis criteria on admission with tachypnea, leukocytosis, met SIRS criteria with evidence of AKI, troponin elevation and evidence of UTI (hematuria, bacteriuria).  I suspect her back pain is a suprapubic pain equivalent.  Today, she has dysuria, foul smelling urine.  Thankfully, she is stbale off antibiotics and just with fluid resuscitation.  She is outside the time frame for blood cultures and lactate.  -Obtain clean catch urine -Start Rocephin     Demand ischemia Evaluated by Cardiology, echo  unremarkable.  No further iscehmic work up necessary.   Thrombocytopenia Anemia Possibly sepsis related.  Platelets stable, lesss than 100K.    Essential hypertension -Hold furomseide, olmesartan -Continue aspirin, carvedilol  Transaminitis I would favor that this is sepsis related. No abdominal pain or RUQ pain at all.   -Trend LFTs and if not improving with antibiotics, fluids, will start work up  Hypothyroidism -Continue levothyroxine  Obesity BMI 45       Disposition: Status is: Inpatient  Remains inpatient appropriate because:Persistent severe electrolyte disturbances and IV treatments appropriate due to intensity of illness or inability to take PO   Dispo: The patient is from: Home              Anticipated d/c is to: Home              Anticipated d/c date is: 3 days              Patient currently is not medically stable to d/c.   Difficult to place patient No    Level of care: Med-Surg       Patient was admitted with multiple complaints, no clear clinical picture, but it appears that the question of NSTEMI has been ruled out, and we suspect that she has got urinary tract infection driving most of her symptomology.  Her renal function may need some aggressive fluids, I will start antibiotics.          MDM: The below labs and imaging reports were reviewed and summarized above.  Medication management as  above.  There is a severe life-threatening illness    DVT prophylaxis: SCDs Start: 12/05/20 0118  Code Status: FULL Family Communication: nephew at the bedside    Consultants:   Cardiology  Procedures:   1/31 renal US -- normal  2/1 renal ultrasound unremarkable  2/1 echocardiogram-normal EF  Antimicrobials:   Rocephin 2/2 >>   Culture data:   2/2 urine culture -- pending           Subjective: Patient still has low back pain, legs both weak.  Little dysuria, no inability to urinate, no leg numbness.  No loss of leg  strength.  Urine is foul-smelling.  No chest pain, no shortness of breath, no orthopnea, no palpitations.  Objective: Vitals:   12/05/20 1546 12/06/20 0020 12/06/20 0637 12/06/20 1234  BP: (!) 94/46 (!) 102/51 (!) 98/53 (!) 95/54  Pulse: 69 62 62 (!) 59  Resp: $Remo'20 18 18 18  'euEoh$ Temp: 99.3 F (37.4 C) 98.7 F (37.1 C) 99 F (37.2 C) 98.1 F (36.7 C)  TempSrc: Oral Oral Oral Oral  SpO2: 93% 100% 99% 98%  Weight:   127.3 kg   Height:        Intake/Output Summary (Last 24 hours) at 12/06/2020 1311 Last data filed at 12/06/2020 4540 Gross per 24 hour  Intake 240 ml  Output 600 ml  Net -360 ml   Filed Weights   12/04/20 1732 12/06/20 0637  Weight: 122.5 kg 127.3 kg    Examination: General appearance: Obese adult female, alert and in no acute distress.  Lying in bed, no acute distress. HEENT: Anicteric, conjunctiva pink, lids and lashes normal. No nasal deformity, discharge, epistaxis.  Lips moist, oropharynx moist, no oral lesions.   Skin: Warm and dry.  no jaundice.  No suspicious rashes or lesions. Cardiac: RRR, nl S1-S2, no murmurs appreciated.  Capillary refill is brisk.  JVP not visible.  No LE edema.  Radial pulses 2+ and symmetric. Respiratory: Normal respiratory rate and rhythm.  CTAB without rales or wheezes. Abdomen: Abdomen soft.  no TTP or guarding. No ascites, distension, hepatosplenomegaly.   MSK: No deformities or effusions. Neuro: Awake and alert.  EOMI, moves all extremities.  Cranial nerves normal.  Leg strength 4/5 and symmetric.  No leg numbness. Speech fluent.    Psych: Sensorium intact and responding to questions, attention normal. Affect normal.  Judgment and insight appear normal.    Data Reviewed: I have personally reviewed following labs and imaging studies:  CBC: Recent Labs  Lab 12/04/20 1737 12/05/20 0424 12/06/20 0311  WBC 19.7* 16.6* 12.4*  NEUTROABS  --  15.0*  --   HGB 10.3* 10.5* 9.2*  HCT 31.3* 31.5* 25.4*  MCV 83.5 85.1 80.6  PLT 92*  108* 98*   Basic Metabolic Panel: Recent Labs  Lab 12/04/20 1737 12/05/20 0424 12/06/20 0311  NA 134* 135 135  K 3.6 3.5 3.5  CL 98 100 100  CO2 $Re'22 22 22  'Ike$ GLUCOSE 94 144* 138*  BUN 81* 93* 93*  CREATININE 3.96* 3.76* 3.21*  CALCIUM 8.2* 8.0* 7.6*  MG  --  2.7*  --   PHOS  --  4.0  --    GFR: Estimated Creatinine Clearance: 22 mL/min (A) (by C-G formula based on SCr of 3.21 mg/dL (H)). Liver Function Tests: Recent Labs  Lab 12/05/20 0424  AST 58*  ALT 56*  ALKPHOS 74  BILITOT 1.3*  PROT 6.6  ALBUMIN 2.4*   No results for input(s): LIPASE, AMYLASE in  the last 168 hours. No results for input(s): AMMONIA in the last 168 hours. Coagulation Profile: No results for input(s): INR, PROTIME in the last 168 hours. Cardiac Enzymes: No results for input(s): CKTOTAL, CKMB, CKMBINDEX, TROPONINI in the last 168 hours. BNP (last 3 results) No results for input(s): PROBNP in the last 8760 hours. HbA1C: No results for input(s): HGBA1C in the last 72 hours. CBG: No results for input(s): GLUCAP in the last 168 hours. Lipid Profile: Recent Labs    12/05/20 0424  CHOL 171  HDL 14*  LDLCALC 116*  TRIG 205*  CHOLHDL 12.2   Thyroid Function Tests: Recent Labs    12/05/20 0424  TSH 2.088   Anemia Panel: Recent Labs    12/05/20 0424  VITAMINB12 651  FOLATE 22.5  FERRITIN 1,428*  TIBC 154*  IRON 47  RETICCTPCT 1.2   Urine analysis:    Component Value Date/Time   COLORURINE YELLOW 12/06/2020 0959   APPEARANCEUR CLOUDY (A) 12/06/2020 0959   LABSPEC 1.015 12/06/2020 0959   PHURINE 7.0 12/06/2020 0959   GLUCOSEU NEGATIVE 12/06/2020 0959   HGBUR LARGE (A) 12/06/2020 0959   BILIRUBINUR NEGATIVE 12/06/2020 0959   KETONESUR NEGATIVE 12/06/2020 0959   PROTEINUR 100 (A) 12/06/2020 0959   NITRITE NEGATIVE 12/06/2020 0959   LEUKOCYTESUR LARGE (A) 12/06/2020 0959   Sepsis Labs: $RemoveBefo'@[id number]'[id number]$ (procalcitonin:4,lacticacidven:4)  ) Recent Results (from the past 240 hour(s))   SARS Coronavirus 2 by RT PCR (hospital order, performed in [place] hospital lab) Nasopharyngeal Nasopharyngeal Swab     Status: None   Collection Time: 12/05/20 12:26 AM   Specimen: Nasopharyngeal Swab  Result Value Ref Range Status   SARS Coronavirus 2 NEGATIVE NEGATIVE Final    Comment: (NOTE) SARS-CoV-2 target nucleic acids are NOT DETECTED.  The SARS-CoV-2 RNA is generally detectable in upper and lower respiratory specimens during the acute phase of infection. The lowest concentration of SARS-CoV-2 viral copies this assay can detect is 250 copies / mL. A negative result does not preclude SARS-CoV-2 infection and should not be used as the sole basis for treatment or other patient management decisions.  A negative result may occur with improper specimen collection / handling, submission of specimen other than nasopharyngeal swab, presence of viral mutation(s) within the areas targeted by this assay, and inadequate number of viral copies (<250 copies / mL). A negative result must be combined with clinical observations, patient history, and epidemiological information.  Fact Sheet for Patients:   StrictlyIdeas.no  Fact Sheet for Healthcare Providers: BankingDealers.co.za  This test is not yet approved or  cleared by the [place] FDA and has been authorized for detection and/or diagnosis of SARS-CoV-2 by FDA under an Emergency Use Authorization (EUA).  This EUA will remain in effect (meaning this test can be used) for the duration of the COVID-19 declaration under Section 564(b)(1) of the Act, 21 U.S.C. section 360bbb-3(b)(1), unless the authorization is terminated or revoked sooner.  Performed at [facility], [place] [address], [place], [place] [address]          Radiology Studies: DG Chest 2 View  Result Date: 12/06/2020 CLINICAL DATA:  Short of breath and chest pain EXAM: CHEST - 2 VIEW COMPARISON:  12/04/2020  FINDINGS: Cardiac enlargement.  Negative for heart failure or edema. Decreased lung volume. Increased bibasilar atelectasis. No significant effusion. IMPRESSION: Increased bibasilar atelectasis with hypoventilation. Electronically Signed   By: Franchot Gallo M.D.   On: 12/06/2020 08:48   DG Chest 2 View  Result  Date: 12/04/2020 CLINICAL DATA:  Chest pain. EXAM: CHEST - 2 VIEW COMPARISON:  None. FINDINGS: The heart size and mediastinal contours are within normal limits. Both lungs are clear. No pneumothorax or pleural effusion is noted. The visualized skeletal structures are unremarkable. IMPRESSION: No active cardiopulmonary disease. Aortic Atherosclerosis (ICD10-I70.0). Electronically Signed   By: Lupita Raider M.D.   On: 12/04/2020 18:09   DG Lumbar Spine 2-3 Views  Result Date: 12/05/2020 CLINICAL DATA:  Initially presented with chest pain now with low back pain EXAM: LUMBAR SPINE - 2-3 VIEW COMPARISON:  None. FINDINGS: Five lumbar type vertebral levels. Dextrocurvature of the lumbar spine with an apex at the L3 level. There is grade 1 anterolisthesis L3 on 4, L4 on 5 with what are likely L3 pars defects, age indeterminate. Findings on a background of diffuse intervertebral disc height loss with multilevel discogenic and facet degenerative changes well as interspinous arthrosis compatible with Baastrup's disease. Normal bone mineralization. No conspicuous lytic or blastic lesions are seen. Included portions of the bony pelvis of appear grossly intact and congruent albeit with degenerative changes in the imaged hips and SI joints. Normal bowel gas pattern. Atherosclerotic calcification of the aorta. Remaining soft tissues are unremarkable. IMPRESSION: 1. 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 Correlate for point tenderness. 2. No other acute osseous abnormality or traumatic malalignment. 3. Background of moderate multilevel discogenic and facet  degenerative changes of the lumbar spine. Electronically Signed   By: Kreg Shropshire M.D.   On: 12/05/2020 02:49   US RENAL  Result Date: 12/05/2020 CLINICAL DATA:  Renal insufficiency EXAM: RENAL / URINARY TRACT ULTRASOUND COMPLETE COMPARISON:  None. FINDINGS: Right Kidney: Renal measurements: 13.6 x 6.7 x 6.2 = volume: 297.7 mL. Suboptimal visualization with extensive rib shadowing and partial obscuration of portions of the renal parenchyma. No visible lesion, shadowing calculus or hydronephrosis. Cortical echogenicity is grossly normal. Left Kidney: Renal measurements: 10.7 x 6.1 x 6.0 = volume: 205.6 mL. Suboptimal visualization with extensive rib shadowing and partial obscuration of portions of the renal parenchyma. No visible lesion, shadowing calculus or hydronephrosis. Cortical echogenicity is grossly normal. Bladder: Bladder partially decompressed with some mild trabeculation. A small 2.3 x 1.9 x 3.7 cm bladder diverticulum is noted along the right lateral aspect of the urinary bladder. Other: Markedly suboptimal assessment of the bilateral kidneys due to a combination of body habitus, bowel gas, poor sonographic windowing and inability to lay flat or reposition in decubitus. IMPRESSION: 1. Suboptimal visualization of the kidneys due to body habitus, rib shadowing and inability to lay flat or reposition in decubitus position. 2. No gross renal abnormality is seen though portions of renal parenchyma incompletely visualized. 3. Mild trabeculation of the bladder with small 3.7 cm bladder diverticulum. Correlate with urinalysis to exclude superimposed cystitis. Electronically Signed   By: Kreg Shropshire M.D.   On: 12/05/2020 03:26   ECHOCARDIOGRAM COMPLETE  Result Date: 12/05/2020    ECHOCARDIOGRAM REPORT   Patient Name:   EKAM BONEBRAKE Date of Exam: 12/05/2020 Medical Rec #:  [id number]        Height:       66.0 in Accession #:    [id number]       Weight:       270.0 lb Date of Birth:  May 05, 1951        BSA:           2.272 m Patient Age:    44  years         BP:           96/54 mmHg Patient Gender: F                HR:           77 bpm. Exam Location:  Inpatient Procedure: 2D Echo, 3D Echo, Color Doppler, Cardiac Doppler and Strain Analysis Indications:    Elevated Troponin                 Chest Pain R07.9  History:        Patient has no prior history of Echocardiogram examinations.                 Signs/Symptoms:Shortness of Breath; Risk Factors:Hypertension                 and Dyslipidemia. Leukocytosis; anemia; thrombocytopenia.  Sonographer:    Darlina Sicilian RDCS Referring Phys: [id number] [place]  1. Left ventricular ejection fraction, by estimation, is 60 to 65%. The left ventricle has normal function. The left ventricle has no regional wall motion abnormalities. There is mild concentric left ventricular hypertrophy. Left ventricular diastolic parameters are consistent with Grade I diastolic dysfunction (impaired relaxation). The average left ventricular global longitudinal strain is -15.8 %. The global longitudinal strain is abnormal.  2. Right ventricular systolic function is normal. The right ventricular size is normal.  3. The mitral valve is normal in structure. No evidence of mitral valve regurgitation. No evidence of mitral stenosis.  4. The aortic valve is normal in structure. Aortic valve regurgitation is not visualized. No aortic stenosis is present.  5. The inferior vena cava is normal in size with greater than 50% respiratory variability, suggesting right atrial pressure of 3 mmHg. FINDINGS  Left Ventricle: Left ventricular ejection fraction, by estimation, is 60 to 65%. The left ventricle has normal function. The left ventricle has no regional wall motion abnormalities. The average left ventricular global longitudinal strain is -15.8 %. The global longitudinal strain is abnormal. The left ventricular internal cavity size was normal in size. There is mild concentric left ventricular  hypertrophy. Left ventricular diastolic parameters are consistent with Grade I diastolic dysfunction (impaired relaxation). Indeterminate filling pressures. Right Ventricle: The right ventricular size is normal. No increase in right ventricular wall thickness. Right ventricular systolic function is normal. Left Atrium: Left atrial size was normal in size. Right Atrium: Right atrial size was normal in size. Pericardium: There is no evidence of pericardial effusion. Mitral Valve: The mitral valve is normal in structure. No evidence of mitral valve regurgitation. No evidence of mitral valve stenosis. Tricuspid Valve: The tricuspid valve is normal in structure. Tricuspid valve regurgitation is not demonstrated. No evidence of tricuspid stenosis. Aortic Valve: The aortic valve is normal in structure. Aortic valve regurgitation is not visualized. No aortic stenosis is present. Aortic valve mean gradient measures 7.9 mmHg. Aortic valve peak gradient measures 12.6 mmHg. Aortic valve area, by VTI measures 2.35 cm. Pulmonic Valve: The pulmonic valve was normal in structure. Pulmonic valve regurgitation is not visualized. No evidence of pulmonic stenosis. Aorta: The aortic root is normal in size and structure. Venous: The inferior vena cava is normal in size with greater than 50% respiratory variability, suggesting right atrial pressure of 3 mmHg. IAS/Shunts: No atrial level shunt detected by color flow Doppler.  LEFT VENTRICLE PLAX 2D LVIDd:         3.90 cm  Diastology LVIDs:  2.60 cm  LV e' medial:    5.00 cm/s LV PW:         1.20 cm  LV E/e' medial:  13.0 LV IVS:        1.50 cm  LV e' lateral:   8.38 cm/s LVOT diam:     2.20 cm  LV E/e' lateral: 7.8 LV SV:         76 LV SV Index:   33       2D Longitudinal Strain LVOT Area:     3.80 cm 2D Strain GLS Avg:     -15.8 %                          3D Volume EF:                         3D EF:        59 %                         LV EDV:       195 ml                          LV ESV:       80 ml                         LV SV:        115 ml RIGHT VENTRICLE RV S prime:     13.50 cm/s TAPSE (M-mode): 2.0 cm LEFT ATRIUM             Index       RIGHT ATRIUM           Index LA diam:        2.70 cm 1.19 cm/m  RA Area:     12.40 cm LA Vol (A2C):   30.5 ml 13.43 ml/m RA Volume:   25.00 ml  11.00 ml/m LA Vol (A4C):   27.6 ml 12.15 ml/m LA Biplane Vol: 29.2 ml 12.85 ml/m  AORTIC VALVE AV Area (Vmax):    2.31 cm AV Area (Vmean):   2.38 cm AV Area (VTI):     2.35 cm AV Vmax:           177.82 cm/s AV Vmean:          133.377 cm/s AV VTI:            0.324 m AV Peak Grad:      12.6 mmHg AV Mean Grad:      7.9 mmHg LVOT Vmax:         108.00 cm/s LVOT Vmean:        83.500 cm/s LVOT VTI:          0.200 m LVOT/AV VTI ratio: 0.62  AORTA Ao Root diam: 3.40 cm Ao Asc diam:  3.50 cm MITRAL VALVE MV Area (PHT): 2.42 cm    SHUNTS MV Decel Time: 313 msec    Systemic VTI:  0.20 m MV E velocity: 65.10 cm/s  Systemic Diam: 2.20 cm MV A velocity: 78.00 cm/s MV E/A ratio:  0.83 Mihai Croitoru MD Electronically signed by Sanda Klein MD Signature Date/Time: 12/05/2020/9:21:45 AM    Final         Scheduled Meds: . aspirin EC  81 mg Oral Daily  .  carvedilol  12.5 mg Oral BID WC  . ezetimibe  10 mg Oral Daily  . levothyroxine  100 mcg Oral Q0600   Continuous Infusions: . sodium chloride 100 mL/hr at 12/06/20 1248  . cefTRIAXone (ROCEPHIN)  IV 1 g (12/06/20 1249)     LOS: 1 day    Time spent: 35 minutes    Edwin Dada, MD [facility] 12/06/2020, 1:11 PM     Please page though [place] or Epic secure chat:  For [employer], Adult nurse

## 2020-12-06 NOTE — Evaluation (Signed)
Physical Therapy Evaluation Patient Details Name: Cassidy Austin MRN: [id number] DOB: 08/19/1951 Today's Date: 12/06/2020   History of Present Illness  Pt is a 70 y/o female admitted secondary to hematuria, fatigue/weakness, and chest pain, over 1-2 days. Found to have sepsis secondary to UTI. PMH includes HTN.  Clinical Impression  Pt admitted secondary to problem above with deficits below. Pt requiring max A for basic bed mobility tasks and reporting 10/10 pain throughout. Unable to tolerate OOB mobility. Pt currently lives alone and feel she will have increase difficulty caring for herself given current deficits. Recommending SNF level therapies at this time. However, once pain improves, pt may progress well and if she progresses well, may be able to consider HHPT with family support. Will continue to follow acutely.     Follow Up Recommendations SNF;Supervision/Assistance - 24 hour    Equipment Recommendations  Wheelchair (measurements PT);Wheelchair cushion (measurements PT)    Recommendations for Other Services       Precautions / Restrictions Precautions Precautions: Fall Restrictions Weight Bearing Restrictions: No      Mobility  Bed Mobility Overal bed mobility: Needs Assistance Bed Mobility: Rolling;Supine to Sit;Sit to Supine Rolling: Max assist   Supine to sit: Max assist Sit to supine: Max assist   General bed mobility comments: Max A to roll to get off bed pan. Pt also requiring max A for trunk and LE assist to sit at EOB. Pt with increased pain and unable to tolerate further mobility.    Transfers                    Ambulation/Gait                Stairs            Wheelchair Mobility    Modified Rankin (Stroke Patients Only)       Balance Overall balance assessment: Needs assistance Sitting-balance support: Bilateral upper extremity supported Sitting balance-Leahy Scale: Poor Sitting balance - Comments: Bracing with BUE                                      Pertinent Vitals/Pain Pain Assessment: 0-10 Pain Score: 10-Worst pain ever Pain Location: low back Pain Descriptors / Indicators: Shooting;Sharp Pain Intervention(s): Limited activity within patient's tolerance;Monitored during session;Repositioned    Home Living Family/patient expects to be discharged to:: Private residence Living Arrangements: Alone Available Help at Discharge: Family Type of Home: House Home Access: Stairs to enter Entrance Stairs-Rails: None Entrance Stairs-Number of Steps: 1 Home Layout: One level Home Equipment: Toilet riser;Walker - 4 wheels;Walker - 2 wheels;Cane - single point Additional Comments: Reports nephew can assist    Prior Function Level of Independence: Independent         Comments: Prior to admission and onset of severe pain, pt was independent with mobility tasks. Pt reports the last 2-3 days, she has been bed bound secondary to increased back pain.     Hand Dominance        Extremity/Trunk Assessment   Upper Extremity Assessment Upper Extremity Assessment: Defer to OT evaluation    Lower Extremity Assessment Lower Extremity Assessment: Generalized weakness    Cervical / Trunk Assessment Cervical / Trunk Assessment: Other exceptions Cervical / Trunk Exceptions: reports severe back pain  Communication   Communication: No difficulties  Cognition Arousal/Alertness: Awake/alert Behavior During Therapy: WFL for tasks assessed/performed Overall Cognitive Status: Within Functional  Limits for tasks assessed                                        General Comments      Exercises     Assessment/Plan    PT Assessment Patient needs continued PT services  PT Problem List Decreased strength;Decreased balance;Decreased mobility;Decreased activity tolerance;Decreased knowledge of use of DME;Decreased knowledge of precautions;Pain       PT Treatment Interventions DME  instruction;Gait training;Functional mobility training;Therapeutic exercise;Therapeutic activities;Balance training;Patient/family education    PT Goals (Current goals can be found in the Care Plan section)  Acute Rehab PT Goals Patient Stated Goal: to decrease pain PT Goal Formulation: With patient Time For Goal Achievement: 12/20/20 Potential to Achieve Goals: Good    Frequency Min 2X/week   Barriers to discharge        Co-evaluation               AM-PAC PT "6 Clicks" Mobility  Outcome Measure Help needed turning from your back to your side while in a flat bed without using bedrails?: A Lot Help needed moving from lying on your back to sitting on the side of a flat bed without using bedrails?: A Lot Help needed moving to and from a bed to a chair (including a wheelchair)?: A Lot Help needed standing up from a chair using your arms (e.g., wheelchair or bedside chair)?: A Lot Help needed to walk in hospital room?: Total Help needed climbing 3-5 steps with a railing? : Total 6 Click Score: 10    End of Session   Activity Tolerance: Patient limited by pain Patient left: in bed;with call bell/phone within reach Nurse Communication: Mobility status PT Visit Diagnosis: Unsteadiness on feet (R26.81);Muscle weakness (generalized) (M62.81);Difficulty in walking, not elsewhere classified (R26.2);Pain Pain - part of body:  (back)    Time: 1323-1350 PT Time Calculation (min) (ACUTE ONLY): 27 min   Charges:   PT Evaluation $PT Eval Moderate Complexity: 1 Mod PT Treatments $Therapeutic Activity: 8-22 mins        Cindee Salt, DPT  Acute Rehabilitation Services  Pager: [phone] Office: [phone]   Lehman Prom 12/06/2020, 3:16 PM

## 2020-12-07 DIAGNOSIS — N289 Disorder of kidney and ureter, unspecified: Secondary | ICD-10-CM | POA: Diagnosis not present

## 2020-12-07 DIAGNOSIS — I10 Essential (primary) hypertension: Secondary | ICD-10-CM | POA: Diagnosis not present

## 2020-12-07 DIAGNOSIS — E7800 Pure hypercholesterolemia, unspecified: Secondary | ICD-10-CM | POA: Diagnosis not present

## 2020-12-07 DIAGNOSIS — R079 Chest pain, unspecified: Secondary | ICD-10-CM | POA: Diagnosis not present

## 2020-12-07 LAB — CBC
HCT: 25 % — ABNORMAL LOW (ref 36.0–46.0)
Hemoglobin: 9.1 g/dL — ABNORMAL LOW (ref 12.0–15.0)
MCH: 29.4 pg (ref 26.0–34.0)
MCHC: 36.4 g/dL — ABNORMAL HIGH (ref 30.0–36.0)
MCV: 80.6 fL (ref 80.0–100.0)
Platelets: 111 10*3/uL — ABNORMAL LOW (ref 150–400)
RBC: 3.1 MIL/uL — ABNORMAL LOW (ref 3.87–5.11)
RDW: 15 % (ref 11.5–15.5)
WBC: 11.9 10*3/uL — ABNORMAL HIGH (ref 4.0–10.5)
nRBC: 0 % (ref 0.0–0.2)

## 2020-12-07 LAB — BASIC METABOLIC PANEL
Anion gap: 11 (ref 5–15)
BUN: 83 mg/dL — ABNORMAL HIGH (ref 8–23)
CO2: 22 mmol/L (ref 22–32)
Calcium: 7.3 mg/dL — ABNORMAL LOW (ref 8.9–10.3)
Chloride: 103 mmol/L (ref 98–111)
Creatinine, Ser: 2.44 mg/dL — ABNORMAL HIGH (ref 0.44–1.00)
GFR, Estimated: 21 mL/min — ABNORMAL LOW (ref >60)
Glucose, Bld: 109 mg/dL — ABNORMAL HIGH (ref 70–99)
Potassium: 3.5 mmol/L (ref 3.5–5.1)
Sodium: 136 mmol/L (ref 135–145)

## 2020-12-07 LAB — KAPPA/LAMBDA LIGHT CHAINS
Kappa free light chain: 77.7 mg/L — ABNORMAL HIGH (ref 3.3–19.4)
Kappa, lambda light chain ratio: 2.74 — ABNORMAL HIGH (ref 0.26–1.65)
Lambda free light chains: 28.4 mg/L — ABNORMAL HIGH (ref 5.7–26.3)

## 2020-12-07 LAB — LACTIC ACID, PLASMA: Lactic Acid, Venous: 1.6 mmol/L (ref 0.5–1.9)

## 2020-12-07 MED ORDER — LACTATED RINGERS IV SOLN: INTRAVENOUS | Status: DC

## 2020-12-07 NOTE — Progress Notes (Signed)
RN and [place], [place] and [place], [place] helped pt ambulate in the room with the walker and helped her into the recliner. Purewick off told pt to call when she had to use the BR to help her ambulate more. Will attempt to ambulate further in the hallway this afternoon.

## 2020-12-07 NOTE — Progress Notes (Signed)
Small amount of blood noted on purewick this morning whiles she was getting a bath

## 2020-12-07 NOTE — Care Management (Signed)
    Durable Medical Equipment  (From admission, onward)         Start     Ordered   Unscheduled  For home use only DME standard manual wheelchair with seat cushion  Once       Comments: Patient suffers from Severe sepsis due to UTI which impairs their ability to perform daily activities like ambulating  in the home.  A cane  will not resolve issue with performing activities of daily living. A wheelchair will allow patient to safely perform daily activities. Patient can safely propel the wheelchair in the home or has a caregiver who can provide assistance. Length of need lifetime . Accessories: elevating leg rests (ELRs), wheel locks, extensions and anti-tippers.  Severe sepsis due to UTI Seat and back cushions   12/07/20 1015

## 2020-12-07 NOTE — Progress Notes (Signed)
[facility] PROGRESS NOTE    Samiha Denapoli  ONG:[id number] DOB: 1951-01-14 DOA: 12/04/2020 PCP: Laurann Montana, MD      Brief Narrative:  Cassidy Austin is a 70 y.o. F with HTN, hypothyroidism, venous insufficiency, and MO who presented with hematuria, fatigue/weakness, and chest pain, over 1-2 days.  Patient was in her usual state of health until about a day or so before presentation, when she developed fatigue/generalized weakness.  She had an episode of hematuria and an episode of chest pain so she came to the ER.  In the ER, troponin was low and flat.  EKG showed no ischemic changes.  Chest x-ray clear.  Urinalysis had pyuria, she had tachypnea, soft blood pressure, platelets 92 K, WBC 19K.  Creatinine 3.96 from baseline <1 in 2013. Cardiology were consulted for possible ACS and she was admitted to the hospital service.          Assessment & Plan:  AKI Cr down to 2.4, good UOP.  -Push oral fluids -Measure urine output -Follow SPEP and free light chains -Trend BMP  -Hold Lasix and olmesartan    Severe sepsis due to UTI -Continue Rocephin -Follow blood and urine cultures      Demand ischemia Evaluated by Cardiology, echo unremarkable.  No further iscehmic work up necessary.   THrombocytopenia Anemia Possibly sepsis related.  Platelets stable.  No clinical bleeding.  Essential hypertension -Hold furosemide, olmesartan -Continue aspirin, carvedilol  Transaminitis I would favor that this is sepsis related. No abdominal pain or RUQ pain at all.  Stable, no change   -Trend  Hypothyroidism -Continue levothyroxine  Morbid obesity BMI >45       Disposition: Status is: Inpatient  Remains inpatient appropriate because:Persistent severe electrolyte disturbances and IV treatments appropriate due to intensity of illness or inability to take PO   Dispo: The patient is from: Home              Anticipated d/c is to: Home               Anticipated d/c date is: 1 day              Patient currently is not medically stable to d/c.   Difficult to place patient No    Level of care: Med-Surg       Patient was admitted with multiple complaints, no clear clinical picture, but it appears that the question of NSTEMI has been ruled out, and we suspect that she has got urinary tract infection driving most of her symptomology.  Renal function improving.  If cultures result tomorrow, will narrow to oral agents and discharge home.        MDM: The below labs and imaging reports were reviewed and summarized above.  Medication management as above.  There is a severe life-threatening illness    DVT prophylaxis: SCDs Start: 12/05/20 0118  Code Status: FULL Family Communication:     Consultants:   Cardiology  Procedures:   1/31 renal US -- normal  2/1 renal ultrasound unremarkable  2/1 echocardiogram-normal EF  Antimicrobials:   Rocephin 2/2 >>   Culture data:   2/2 urine culture -- pending           Subjective: Patient's low back pain is still present, but overall she is improving eating stronger. No loss of urinary tone, no leg numbness. Leg strength is improving.  No chest pain, shortness of breath.       Objective: Vitals:   12/06/20  1657 12/06/20 2320 12/07/20 0558 12/07/20 1146  BP: (!) 126/47 (!) 117/47 (!) 107/49 (!) 181/95  Pulse: 72 68 71 91  Resp: 18 18 18 18   Temp: 98.9 F (37.2 C) 99.6 F (37.6 C) 100.3 F (37.9 C) 98 F (36.7 C)  TempSrc: Oral Oral Oral Oral  SpO2: 97% 94% 96% 99%  Weight:   127.9 kg   Height:        Intake/Output Summary (Last 24 hours) at 12/07/2020 1522 Last data filed at 12/07/2020 1300 Gross per 24 hour  Intake 431.67 ml  Output 1750 ml  Net -1318.33 ml   Filed Weights   12/04/20 1732 12/06/20 0637 12/07/20 0558  Weight: 122.5 kg 127.3 kg 127.9 kg    Examination: General appearance: Adult female, lying in bed, appears tired.      HEENT: Anicteric, conjunctival pink, lids and lashes normal. No nasal deformity, discharge, or epistaxis. Skin:  Cardiac: RRR, no murmurs, no lower extremity edema Respiratory: Normal respiratory rate rhythm, lungs clear without rales or wheezes Abdomen: Abdomen soft without tenderness palpation or guarding in all quadrants, no ascites or distention. MSK:  Neuro: Awake and alert, extraocular movements intact, moves all extremities with severe generalized weakness, pain limits movement of the legs. Psych: Attention normal, affect normal, judgment site appear normal      Data Reviewed: I have personally reviewed following labs and imaging studies:  CBC: Recent Labs  Lab 12/04/20 1737 12/05/20 0424 12/06/20 0311 12/07/20 0127  WBC 19.7* 16.6* 12.4* 11.9*  NEUTROABS  --  15.0*  --   --   HGB 10.3* 10.5* 9.2* 9.1*  HCT 31.3* 31.5* 25.4* 25.0*  MCV 83.5 85.1 80.6 80.6  PLT 92* 108* 98* 111*   Basic Metabolic Panel: Recent Labs  Lab 12/04/20 1737 12/05/20 0424 12/06/20 0311 12/07/20 0127  NA 134* 135 135 136  K 3.6 3.5 3.5 3.5  CL 98 100 100 103  CO2 22 22 22 22   GLUCOSE 94 144* 138* 109*  BUN 81* 93* 93* 83*  CREATININE 3.96* 3.76* 3.21* 2.44*  CALCIUM 8.2* 8.0* 7.6* 7.3*  MG  --  2.7*  --   --   PHOS  --  4.0  --   --    GFR: Estimated Creatinine Clearance: 28.9 mL/min (A) (by C-G formula based on SCr of 2.44 mg/dL (H)). Liver Function Tests: Recent Labs  Lab 12/05/20 0424  AST 58*  ALT 56*  ALKPHOS 74  BILITOT 1.3*  PROT 6.6  ALBUMIN 2.4*   No results for input(s): LIPASE, AMYLASE in the last 168 hours. No results for input(s): AMMONIA in the last 168 hours. Coagulation Profile: No results for input(s): INR, PROTIME in the last 168 hours. Cardiac Enzymes: No results for input(s): CKTOTAL, CKMB, CKMBINDEX, TROPONINI in the last 168 hours. BNP (last 3 results) No results for input(s): PROBNP in the last 8760 hours. HbA1C: No results for input(s): HGBA1C  in the last 72 hours. CBG: No results for input(s): GLUCAP in the last 168 hours. Lipid Profile: Recent Labs    12/05/20 0424  CHOL 171  HDL 14*  LDLCALC 116*  TRIG 205*  CHOLHDL 12.2   Thyroid Function Tests: Recent Labs    12/05/20 0424  TSH 2.088   Anemia Panel: Recent Labs    12/05/20 0424  VITAMINB12 651  FOLATE 22.5  FERRITIN 1,428*  TIBC 154*  IRON 47  RETICCTPCT 1.2   Urine analysis:    Component Value Date/Time   COLORURINE  YELLOW 12/06/2020 0959   APPEARANCEUR CLOUDY (A) 12/06/2020 0959   LABSPEC 1.015 12/06/2020 0959   PHURINE 7.0 12/06/2020 0959   GLUCOSEU NEGATIVE 12/06/2020 0959   HGBUR LARGE (A) 12/06/2020 0959   BILIRUBINUR NEGATIVE 12/06/2020 0959   KETONESUR NEGATIVE 12/06/2020 0959   PROTEINUR 100 (A) 12/06/2020 0959   NITRITE NEGATIVE 12/06/2020 0959   LEUKOCYTESUR LARGE (A) 12/06/2020 0959   Sepsis Labs: @LABRCNTIP (procalcitonin:4,lacticacidven:4)  ) Recent Results (from the past 240 hour(s))  SARS Coronavirus 2 by RT PCR (hospital order, performed in [facility] hospital lab) Nasopharyngeal Nasopharyngeal Swab     Status: None   Collection Time: 12/05/20 12:26 AM   Specimen: Nasopharyngeal Swab  Result Value Ref Range Status   SARS Coronavirus 2 NEGATIVE NEGATIVE Final    Comment: (NOTE) SARS-CoV-2 target nucleic acids are NOT DETECTED.  The SARS-CoV-2 RNA is generally detectable in upper and lower respiratory specimens during the acute phase of infection. The lowest concentration of SARS-CoV-2 viral copies this assay can detect is 250 copies / mL. A negative result does not preclude SARS-CoV-2 infection and should not be used as the sole basis for treatment or other patient management decisions.  A negative result may occur with improper specimen collection / handling, submission of specimen other than nasopharyngeal swab, presence of viral mutation(s) within the areas targeted by this assay, and inadequate number of viral  copies (<250 copies / mL). A negative result must be combined with clinical observations, patient history, and epidemiological information.  Fact Sheet for Patients:   BoilerBrush.com.cy  Fact Sheet for Healthcare Providers: https://pope.com/  This test is not yet approved or  cleared by the [place] FDA and has been authorized for detection and/or diagnosis of SARS-CoV-2 by FDA under an Emergency Use Authorization (EUA).  This EUA will remain in effect (meaning this test can be used) for the duration of the COVID-19 declaration under Section 564(b)(1) of the Act, 21 U.S.C. section 360bbb-3(b)(1), unless the authorization is terminated or revoked sooner.  Performed at [facility], [address], [place], [place] [address]   Culture, Urine     Status: Abnormal (Preliminary result)   Collection Time: 12/06/20  9:59 AM   Specimen: Urine, Clean Catch  Result Value Ref Range Status   Specimen Description URINE, CLEAN CATCH  Final   Special Requests NONE  Final   Culture (A)  Final    >=100,000 COLONIES/mL GRAM NEGATIVE RODS CULTURE REINCUBATED FOR BETTER GROWTH Performed at [facility], [address] [place], [place] [address]    Report Status PENDING  Incomplete  Culture, Urine     Status: Abnormal (Preliminary result)   Collection Time: 12/06/20  4:06 PM   Specimen: Urine, Random  Result Value Ref Range Status   Specimen Description URINE, RANDOM  Final   Special Requests NONE  Final   Culture (A)  Final    >=100,000 COLONIES/mL ESCHERICHIA COLI SUSCEPTIBILITIES TO FOLLOW Performed at [facility], [address], [place], [place] [address]    Report Status PENDING  Incomplete         Radiology Studies: DG Chest 2 View  Result Date: 12/06/2020 CLINICAL DATA:  Short of breath and chest pain EXAM: CHEST - 2 VIEW COMPARISON:  12/04/2020 FINDINGS: Cardiac enlargement.  Negative for heart failure  or edema. Decreased lung volume. Increased bibasilar atelectasis. No significant effusion. IMPRESSION: Increased bibasilar atelectasis with hypoventilation. Electronically Signed   By: Marlan Palau M.D.   On:  12/06/2020 08:48        Scheduled Meds: . aspirin EC  81 mg Oral Daily  . carvedilol  12.5 mg Oral BID WC  . ezetimibe  10 mg Oral Daily  . levothyroxine  100 mcg Oral Q0600   Continuous Infusions: . sodium chloride 100 mL/hr at 12/07/20 0849  . cefTRIAXone (ROCEPHIN)  IV 1 g (12/07/20 1204)     LOS: 2 days    Time spent: 25 minutes    Alberteen Sam, MD [facility] 12/07/2020, 3:22 PM     Please page though AMION or Epic secure chat:  For [employer], Higher education careers adviser

## 2020-12-07 NOTE — Progress Notes (Signed)
Progress Note  Patient Name: Cassidy Austin Date of Encounter: 12/07/2020  [facility] Cardiologist: Dr Eldridge Dace  Subjective   The patient is feeling better.  Inpatient Medications    Scheduled Meds: . aspirin EC  81 mg Oral Daily  . carvedilol  12.5 mg Oral BID WC  . ezetimibe  10 mg Oral Daily  . levothyroxine  100 mcg Oral Q0600   Continuous Infusions: . sodium chloride 100 mL/hr at 12/07/20 0849  . cefTRIAXone (ROCEPHIN)  IV Stopped (12/06/20 1320)   PRN Meds: acetaminophen, nitroGLYCERIN, ondansetron (ZOFRAN) IV   Vital Signs    Vitals:   12/06/20 1234 12/06/20 1657 12/06/20 2320 12/07/20 0558  BP: (!) 95/54 (!) 126/47 (!) 117/47 (!) 107/49  Pulse: (!) 59 72 68 71  Resp: 18 18 18 18   Temp: 98.1 F (36.7 C) 98.9 F (37.2 C) 99.6 F (37.6 C) 100.3 F (37.9 C)  TempSrc: Oral Oral Oral Oral  SpO2: 98% 97% 94% 96%  Weight:    127.9 kg  Height:        Intake/Output Summary (Last 24 hours) at 12/07/2020 1104 Last data filed at 12/07/2020 02/04/2021 Gross per 24 hour  Intake 911.67 ml  Output 1400 ml  Net -488.33 ml   Last 3 Weights 12/07/2020 12/06/2020 12/04/2020  Weight (lbs) 281 lb 15.5 oz 280 lb 10.3 oz 270 lb  Weight (kg) 127.9 kg 127.3 kg 122.471 kg      Telemetry    SR - Personally Reviewed  ECG    SR, normal ECG - Personally Reviewed  Physical Exam   GEN: No acute distress.   Neck: No JVD Cardiac: RRR, no murmurs, rubs, or gallops.  Respiratory: Clear to auscultation bilaterally. GI: Soft, nontender, non-distended  MS: No edema; No deformity. Very warm extremities Neuro:  Nonfocal  Psych: Normal affect   Labs    High Sensitivity Troponin:   Recent Labs  Lab 12/04/20 1737 12/04/20 2022  TROPONINIHS 112* 104*     Chemistry Recent Labs  Lab 12/05/20 0424 12/06/20 0311 12/07/20 0127  NA 135 135 136  K 3.5 3.5 3.5  CL 100 100 103  CO2 22 22 22   GLUCOSE 144* 138* 109*  BUN 93* 93* 83*  CREATININE 3.76* 3.21* 2.44*  CALCIUM 8.0*  7.6* 7.3*  PROT 6.6  --   --   ALBUMIN 2.4*  --   --   AST 58*  --   --   ALT 56*  --   --   ALKPHOS 74  --   --   BILITOT 1.3*  --   --   GFRNONAA 12* 15* 21*  ANIONGAP 13 13 11     Hematology Recent Labs  Lab 12/05/20 0424 12/06/20 0311 12/07/20 0127  WBC 16.6* 12.4* 11.9*  RBC 3.70*  3.73* 3.15* 3.10*  HGB 10.5* 9.2* 9.1*  HCT 31.5* 25.4* 25.0*  MCV 85.1 80.6 80.6  MCH 28.4 29.2 29.4  MCHC 33.3 36.2* 36.4*  RDW 15.8* 15.0 15.0  PLT 108* 98* 111*    BNPNo results for input(s): BNP, PROBNP in the last 168 hours.   DDimer No results for input(s): DDIMER in the last 168 hours.   Radiology    DG Chest 2 View  Result Date: 12/04/2020 CLINICAL DATA:  Chest pain. EXAM: CHEST - 2 VIEW COMPARISON:  None. FINDINGS: The heart size and mediastinal contours are within normal limits. Both lungs are clear. No pneumothorax or pleural effusion is noted. The visualized skeletal structures  are unremarkable. IMPRESSION: No active cardiopulmonary disease. Aortic Atherosclerosis (ICD10-I70.0). Electronically Signed   By: Lupita Raider M.D.   On: 12/04/2020 18:09   DG Lumbar Spine 2-3 Views  Result Date: 12/05/2020 CLINICAL DATA:  Initially presented with chest pain now with low back pain EXAM: LUMBAR SPINE - 2-3 VIEW COMPARISON:  None. FINDINGS: Five lumbar type vertebral levels. Dextrocurvature of the lumbar spine with an apex at the L3 level. There is grade 1 anterolisthesis L3 on 4, L4 on 5 with what are likely L3 pars defects, age indeterminate. Findings on a background of diffuse intervertebral disc height loss with multilevel discogenic and facet degenerative changes well as interspinous arthrosis compatible with Baastrup's disease. Normal bone mineralization. No conspicuous lytic or blastic lesions are seen. Included portions of the bony pelvis of appear grossly intact and congruent albeit with degenerative changes in the imaged hips and SI joints. Normal bowel gas pattern. Atherosclerotic  calcification of the aorta. Remaining soft tissues are unremarkable. IMPRESSION: 1. 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 Correlate for point tenderness. 2. No other acute osseous abnormality or traumatic malalignment. 3. Background of moderate multilevel discogenic and facet degenerative changes of the lumbar spine. Electronically Signed   By: Kreg Shropshire M.D.   On: 12/05/2020 02:49   US RENAL  Result Date: 12/05/2020 CLINICAL DATA:  Renal insufficiency EXAM: RENAL / URINARY TRACT ULTRASOUND COMPLETE COMPARISON:  None. FINDINGS: Right Kidney: Renal measurements: 13.6 x 6.7 x 6.2 = volume: 297.7 mL. Suboptimal visualization with extensive rib shadowing and partial obscuration of portions of the renal parenchyma. No visible lesion, shadowing calculus or hydronephrosis. Cortical echogenicity is grossly normal. Left Kidney: Renal measurements: 10.7 x 6.1 x 6.0 = volume: 205.6 mL. Suboptimal visualization with extensive rib shadowing and partial obscuration of portions of the renal parenchyma. No visible lesion, shadowing calculus or hydronephrosis. Cortical echogenicity is grossly normal. Bladder: Bladder partially decompressed with some mild trabeculation. A small 2.3 x 1.9 x 3.7 cm bladder diverticulum is noted along the right lateral aspect of the urinary bladder. Other: Markedly suboptimal assessment of the bilateral kidneys due to a combination of body habitus, bowel gas, poor sonographic windowing and inability to lay flat or reposition in decubitus. IMPRESSION: 1. Suboptimal visualization of the kidneys due to body habitus, rib shadowing and inability to lay flat or reposition in decubitus position. 2. No gross renal abnormality is seen though portions of renal parenchyma incompletely visualized. 3. Mild trabeculation of the bladder with small 3.7 cm bladder diverticulum. Correlate with urinalysis to exclude superimposed cystitis. Electronically  Signed   By: Kreg Shropshire M.D.   On: 12/05/2020 03:26   ECHOCARDIOGRAM COMPLETE Result Date: 12/05/2020     Cardiac Studies   ECHOCARDIOGRAM COMPLETE Result Date: 12/05/2020 1. Left ventricular ejection fraction, by estimation, is 60 to 65%. The  left ventricle has normal function. The left ventricle has no regional  wall motion abnormalities. There is mild concentric left ventricular  hypertrophy. Left ventricular diastolic  parameters are consistent with Grade I diastolic dysfunction (impaired  relaxation). The average left ventricular global longitudinal strain is  -15.8 %. The global longitudinal strain is abnormal.  2. Right ventricular systolic function is normal. The right ventricular  size is normal.  3. The mitral valve is normal in structure. No evidence of mitral valve  regurgitation. No evidence of mitral stenosis.  4. The aortic valve is normal in structure. Aortic valve regurgitation is  not visualized. No aortic stenosis is present.  5. The inferior vena cava is normal in size with greater than 50%  respiratory variability, suggesting right atrial pressure of 3 mmHg.   Patient Profile     70 y.o. female presenting with lethargy, fatigue, shortness of breath.  She has no chest pain.  Troponin was checked for unclear reason and is mildly elevated.   Other pertinent features of presenation include new onset renal failure; liver function not evaluated.    At this time suspect mild supply demand mismatch in the setting of unappreciated systemic process.  Differential includes but not limited to sepsis with significant leukocytosis, pulmonary embolism, hypothyroidsim, anemia, malignancy.  Assessment & Plan    Problem list  #Severe UTI with sepsis #Acute kidney failure - sec to UTI, improving #Shortness of breath #Troponin elevation -demand ischemia with flat trend, most probably related to underlying sepsis  #Hypothyroidsim - Check TSH - Echocardiogram shows  normal LVEF 60 to 65% with grade 1 diastolic dysfunction and mildly impaired strain - No antithrombotics indicated from our perspective -She is not a candidate for coronary CTA to evaluate for potential coronary artery disease, once her work-up is completed by primary team I would suggest that she undergoes nuclear stress testing.  We will sign off, we will arrange for follow-up in our clinic and if she still has symptoms of shortness of breath will consider ischemic work-up at that point. Please call us with any questions.  For questions or updates, please contact [facility] Please consult [email or web address] for contact info under     Signed, Tobias Alexander, MD  12/07/2020, 11:04 AM

## 2020-12-08 DIAGNOSIS — R0782 Intercostal pain: Secondary | ICD-10-CM

## 2020-12-08 LAB — COMPREHENSIVE METABOLIC PANEL
ALT: 46 U/L — ABNORMAL HIGH (ref 0–44)
AST: 28 U/L (ref 15–41)
Albumin: 1.9 g/dL — ABNORMAL LOW (ref 3.5–5.0)
Alkaline Phosphatase: 91 U/L (ref 38–126)
Anion gap: 9 (ref 5–15)
BUN: 79 mg/dL — ABNORMAL HIGH (ref 8–23)
CO2: 22 mmol/L (ref 22–32)
Calcium: 7.7 mg/dL — ABNORMAL LOW (ref 8.9–10.3)
Chloride: 105 mmol/L (ref 98–111)
Creatinine, Ser: 2.38 mg/dL — ABNORMAL HIGH (ref 0.44–1.00)
GFR, Estimated: 21 mL/min — ABNORMAL LOW (ref >60)
Glucose, Bld: 122 mg/dL — ABNORMAL HIGH (ref 70–99)
Potassium: 3.3 mmol/L — ABNORMAL LOW (ref 3.5–5.1)
Sodium: 136 mmol/L (ref 135–145)
Total Bilirubin: 0.7 mg/dL (ref 0.3–1.2)
Total Protein: 6.2 g/dL — ABNORMAL LOW (ref 6.5–8.1)

## 2020-12-08 LAB — URINE CULTURE
Culture: >=100000 — AB
Culture: >=100000 — AB

## 2020-12-08 LAB — CBC WITH DIFFERENTIAL/PLATELET
Abs Immature Granulocytes: 1.33 10*3/uL — ABNORMAL HIGH (ref 0.00–0.07)
Basophils Absolute: 0 10*3/uL (ref 0.0–0.1)
Basophils Relative: 0 %
Eosinophils Absolute: 0.2 10*3/uL (ref 0.0–0.5)
Eosinophils Relative: 1 %
HCT: 26 % — ABNORMAL LOW (ref 36.0–46.0)
Hemoglobin: 8.9 g/dL — ABNORMAL LOW (ref 12.0–15.0)
Immature Granulocytes: 7 %
Lymphocytes Relative: 8 %
Lymphs Abs: 1.4 10*3/uL (ref 0.7–4.0)
MCH: 28.1 pg (ref 26.0–34.0)
MCHC: 34.2 g/dL (ref 30.0–36.0)
MCV: 82 fL (ref 80.0–100.0)
Monocytes Absolute: 0.9 10*3/uL (ref 0.1–1.0)
Monocytes Relative: 5 %
Neutro Abs: 14.6 10*3/uL — ABNORMAL HIGH (ref 1.7–7.7)
Neutrophils Relative %: 79 %
Platelets: 161 10*3/uL (ref 150–400)
RBC: 3.17 MIL/uL — ABNORMAL LOW (ref 3.87–5.11)
RDW: 15.1 % (ref 11.5–15.5)
WBC: 18.5 10*3/uL — ABNORMAL HIGH (ref 4.0–10.5)
nRBC: 0 % (ref 0.0–0.2)

## 2020-12-08 LAB — PROTEIN ELECTROPHORESIS, SERUM
A/G Ratio: 0.6 — ABNORMAL LOW (ref 0.7–1.7)
Albumin ELP: 2.2 g/dL — ABNORMAL LOW (ref 2.9–4.4)
Alpha-1-Globulin: 0.5 g/dL — ABNORMAL HIGH (ref 0.0–0.4)
Alpha-2-Globulin: 1 g/dL (ref 0.4–1.0)
Beta Globulin: 0.9 g/dL (ref 0.7–1.3)
Gamma Globulin: 1.1 g/dL (ref 0.4–1.8)
Globulin, Total: 3.5 g/dL (ref 2.2–3.9)
Total Protein ELP: 5.7 g/dL — ABNORMAL LOW (ref 6.0–8.5)

## 2020-12-08 LAB — MAGNESIUM: Magnesium: 2.7 mg/dL — ABNORMAL HIGH (ref 1.7–2.4)

## 2020-12-08 MED ORDER — POTASSIUM CHLORIDE CRYS ER 20 MEQ PO TBCR
20.0000 meq | EXTENDED_RELEASE_TABLET | Freq: Two times a day (BID) | ORAL | Status: AC
  Administered 2020-12-08 – 2020-12-09 (×4): 20 meq via ORAL
  Filled 2020-12-08 (×4): qty 1

## 2020-12-08 MED ORDER — CEPHALEXIN 500 MG PO CAPS
500.0000 mg | ORAL_CAPSULE | Freq: Three times a day (TID) | ORAL | Status: AC
  Administered 2020-12-08 – 2020-12-12 (×15): 500 mg via ORAL
  Filled 2020-12-08 (×15): qty 1

## 2020-12-08 NOTE — Progress Notes (Signed)
Urine culture came back with pan sens e.coli. D/w Dr. Corrie Mckusick and we will optimize her ceftriaxone to Keflex for 5 more days. Will use standard dose with her AKI due to increase weight.   Ulyses Southward, PharmD, BCIDP, AAHIVP, CPP Infectious Disease Pharmacist 12/08/2020 8:35 AM

## 2020-12-08 NOTE — TOC Progression Note (Signed)
Transition of Care [facility]) - Progression Note    Patient Details  Name: Cassidy Austin MRN: [id number] Date of Birth: Apr 13, 1951  Transition of Care [facility]) CM/SW Contact  Jimmy Picket, [place] Phone Number: 12/08/2020, 4:33 PM  Clinical Narrative:     CSW gave pt her two bed offers. Pt stated that after SNF she will be going home with her Nephew and is requesting a SNF in [facility] to be closer to him. CSW hard faxed referral to the following facilities:   [facility] [phone], [place] Fax [phone],   [facility] [phone] fax [phone]  [facility] [phone] fax  [phone]  [facility] [phone], [facility] Fax: [phone]  Expected Discharge Plan: Skilled Nursing Facility Barriers to Discharge: Insurance Authorization,Continued Medical Work up  Expected Discharge Plan and Services Expected Discharge Plan: Skilled Nursing Facility       Living arrangements for the past 2 months: Single Family Home                                       Social Determinants of Health (SDOH) Interventions    Readmission Risk Interventions No flowsheet data found.  Jimmy Picket, Theresia Majors, [place] Clinical Social Worker [phone]

## 2020-12-08 NOTE — Progress Notes (Signed)
PROGRESS NOTE    Cassidy Austin  ZOX:[id number] DOB: Dec 18, 1950 DOA: 12/04/2020 PCP: Laurann Montana, MD    Brief Narrative:  70 year old female with history of hypertension, resume home venous insufficiency, morbid obesity presented with hematuria, fatigue weakness and chest discomfort for 2 days.  She was found to have UTI, minimally elevated troponins, acute kidney injury with recent normal baseline renal functions.   Assessment & Plan:   Principal Problem:   Chest pain Active Problems:   Hypothyroidism   Hypertension   Hypercholesterolemia   Renal insufficiency   Normocytic anemia   Thrombocytopenia (HCC)   Leukocytosis  Acute kidney injury: Lab Results  Component Value Date   CREATININE 2.38 (H) 12/08/2020   CREATININE 2.44 (H) 12/07/2020   CREATININE 3.21 (H) 12/06/2020   Her creatinine is gradually improving and normalizing.  Urine output is adequate.  This is probably prerenal with dehydration, ongoing use of Lasix and olmesartan. Renal ultrasound with no evidence of hydronephrosis or obstruction. Recent outpatient kidney function test on 7/21 with normal. Continue isotonic fluid today, recheck level tomorrow.  Severe sepsis due to E. coli UTI: Present on admission.  Resolved.  Blood cultures negative.  Urine culture with pansensitive E. coli.  Treated with Rocephin, changed to oral cephalosporin for 5 days.  Atypical chest pain with demand ischemia: Evaluated by cardiology.  Echocardiogram was normal.  If recurrent chest pain suggested nuclear stress test.  Currently no indication for treatment.  Essential hypertension: Currently on carvedilol.  Blood pressures normal or low normal.  Transaminitis: Mild elevated transaminases probably due to transient shock liver.  Improved and normalized.  Hypothyroidism: Clinically euthyroid on current dose of thyroxine.  Debility/deconditioning: Work with PT OT.  She will benefit with short-term rehab.  Refer to  SNF.   DVT prophylaxis: SCDs Start: 12/05/20 0118   Code Status: Full code Family Communication: None Disposition Plan: Status is: Inpatient  Remains inpatient appropriate because:Inpatient level of care appropriate due to severity of illness   Dispo: The patient is from: Home              Anticipated d/c is to: SNF              Anticipated d/c date is: 2 days              Patient currently is not medically stable to d/c.   Difficult to place patient No         Consultants:   None  Procedures:   None   Antimicrobials:  Anti-infectives (From admission, onward)   Start     Dose/Rate Route Frequency Ordered Stop   12/08/20 0930  cephALEXin (KEFLEX) capsule 500 mg        500 mg Oral Every 8 hours 12/08/20 0833 12/13/20 0559   12/06/20 1230  cefTRIAXone (ROCEPHIN) 1 g in sodium chloride 0.9 % 100 mL IVPB  Status:  Discontinued        1 g 200 mL/hr over 30 Minutes Intravenous Every 24 hours 12/06/20 1117 12/08/20 0833         Subjective: Patient seen and examined.  Her main problem is low back pain and difficult walking.  Denies any nausea vomiting.  No more chest pain.  No urinary symptoms today.  Remains afebrile. Apparently, she has been moving her house and working hard for about a month.  Objective: Vitals:   12/07/20 1658 12/08/20 0043 12/08/20 0452 12/08/20 1221  BP: 100/60 (!) 101/53 (!) 102/48 (!) 102/51  Pulse:  Marland Kitchen)  58 (!) 56 (!) 57  Resp:  18 18 16   Temp:  97.8 F (36.6 C) 97.8 F (36.6 C) 97.7 F (36.5 C)  TempSrc:  Oral Oral Oral  SpO2:  99% 98% 99%  Weight:      Height:        Intake/Output Summary (Last 24 hours) at 12/08/2020 1341 Last data filed at 12/08/2020 0300 Gross per 24 hour  Intake 1112.79 ml  Output --  Net 1112.79 ml   Filed Weights   12/04/20 1732 12/06/20 0637 12/07/20 0558  Weight: 122.5 kg 127.3 kg 127.9 kg    Examination:  General exam: Appears calm and comfortable  Comfortable on room air. Respiratory system:  Clear to auscultation. Respiratory effort normal. Cardiovascular system: S1 & S2 heard, RRR. No JVD, murmurs, rubs, gallops or clicks. No pedal edema. Gastrointestinal system: Abdomen is nondistended, soft and nontender. No organomegaly or masses felt. Normal bowel sounds heard.  Obese and pendulous. Central nervous system: Alert and oriented. No focal neurological deficits. Extremities: Symmetric 5 x 5 power. Skin: No rashes, lesions or ulcers Psychiatry: Judgement and insight appear normal. Mood & affect appropriate.     Data Reviewed: I have personally reviewed following labs and imaging studies  CBC: Recent Labs  Lab 12/04/20 1737 12/05/20 0424 12/06/20 0311 12/07/20 0127 12/08/20 0755  WBC 19.7* 16.6* 12.4* 11.9* 18.5*  NEUTROABS  --  15.0*  --   --  14.6*  HGB 10.3* 10.5* 9.2* 9.1* 8.9*  HCT 31.3* 31.5* 25.4* 25.0* 26.0*  MCV 83.5 85.1 80.6 80.6 82.0  PLT 92* 108* 98* 111* 161   Basic Metabolic Panel: Recent Labs  Lab 12/04/20 1737 12/05/20 0424 12/06/20 0311 12/07/20 0127 12/08/20 0755  NA 134* 135 135 136 136  K 3.6 3.5 3.5 3.5 3.3*  CL 98 100 100 103 105  CO2 22 22 22 22 22   GLUCOSE 94 144* 138* 109* 122*  BUN 81* 93* 93* 83* 79*  CREATININE 3.96* 3.76* 3.21* 2.44* 2.38*  CALCIUM 8.2* 8.0* 7.6* 7.3* 7.7*  MG  --  2.7*  --   --  2.7*  PHOS  --  4.0  --   --   --    GFR: Estimated Creatinine Clearance: 29.7 mL/min (A) (by C-G formula based on SCr of 2.38 mg/dL (H)). Liver Function Tests: Recent Labs  Lab 12/05/20 0424 12/08/20 0755  AST 58* 28  ALT 56* 46*  ALKPHOS 74 91  BILITOT 1.3* 0.7  PROT 6.6 6.2*  ALBUMIN 2.4* 1.9*   No results for input(s): LIPASE, AMYLASE in the last 168 hours. No results for input(s): AMMONIA in the last 168 hours. Coagulation Profile: No results for input(s): INR, PROTIME in the last 168 hours. Cardiac Enzymes: No results for input(s): CKTOTAL, CKMB, CKMBINDEX, TROPONINI in the last 168 hours. BNP (last 3  results) No results for input(s): PROBNP in the last 8760 hours. HbA1C: No results for input(s): HGBA1C in the last 72 hours. CBG: No results for input(s): GLUCAP in the last 168 hours. Lipid Profile: No results for input(s): CHOL, HDL, LDLCALC, TRIG, CHOLHDL, LDLDIRECT in the last 72 hours. Thyroid Function Tests: No results for input(s): TSH, T4TOTAL, FREET4, T3FREE, THYROIDAB in the last 72 hours. Anemia Panel: No results for input(s): VITAMINB12, FOLATE, FERRITIN, TIBC, IRON, RETICCTPCT in the last 72 hours. Sepsis Labs: Recent Labs  Lab 12/07/20 1004  LATICACIDVEN 1.6    Recent Results (from the past 240 hour(s))  SARS Coronavirus 2 by RT PCR (  hospital order, performed in [facility] hospital lab) Nasopharyngeal Nasopharyngeal Swab     Status: None   Collection Time: 12/05/20 12:26 AM   Specimen: Nasopharyngeal Swab  Result Value Ref Range Status   SARS Coronavirus 2 NEGATIVE NEGATIVE Final    Comment: (NOTE) SARS-CoV-2 target nucleic acids are NOT DETECTED.  The SARS-CoV-2 RNA is generally detectable in upper and lower respiratory specimens during the acute phase of infection. The lowest concentration of SARS-CoV-2 viral copies this assay can detect is 250 copies / mL. A negative result does not preclude SARS-CoV-2 infection and should not be used as the sole basis for treatment or other patient management decisions.  A negative result may occur with improper specimen collection / handling, submission of specimen other than nasopharyngeal swab, presence of viral mutation(s) within the areas targeted by this assay, and inadequate number of viral copies (<250 copies / mL). A negative result must be combined with clinical observations, patient history, and epidemiological information.  Fact Sheet for Patients:   BoilerBrush.com.cy  Fact Sheet for Healthcare Providers: https://pope.com/  This test is not yet approved or   cleared by the [place] FDA and has been authorized for detection and/or diagnosis of SARS-CoV-2 by FDA under an Emergency Use Authorization (EUA).  This EUA will remain in effect (meaning this test can be used) for the duration of the COVID-19 declaration under Section 564(b)(1) of the Act, 21 U.S.C. section 360bbb-3(b)(1), unless the authorization is terminated or revoked sooner.  Performed at [facility], [address], [place], [place] [address]   Culture, Urine     Status: Abnormal (Preliminary result)   Collection Time: 12/06/20  9:59 AM   Specimen: Urine, Clean Catch  Result Value Ref Range Status   Specimen Description URINE, CLEAN CATCH  Final   Special Requests NONE  Final   Culture (A)  Final    >=100,000 COLONIES/mL GRAM NEGATIVE RODS CULTURE REINCUBATED FOR BETTER GROWTH Performed at [facility], [address] [place], [place] [address]    Report Status PENDING  Incomplete  Culture, Urine     Status: Abnormal   Collection Time: 12/06/20  4:06 PM   Specimen: Urine, Random  Result Value Ref Range Status   Specimen Description URINE, RANDOM  Final   Special Requests   Final    NONE Performed at [facility], [address], [place], [place] [address]    Culture >=100,000 COLONIES/mL ESCHERICHIA COLI (A)  Final   Report Status 12/08/2020 FINAL  Final   Organism ID, Bacteria ESCHERICHIA COLI (A)  Final      Susceptibility   Escherichia coli - MIC*    AMPICILLIN 8 SENSITIVE Sensitive     CEFAZOLIN <=4 SENSITIVE Sensitive     CEFEPIME <=0.12 SENSITIVE Sensitive     CEFTRIAXONE <=0.25 SENSITIVE Sensitive     CIPROFLOXACIN <=0.25 SENSITIVE Sensitive     GENTAMICIN <=1 SENSITIVE Sensitive     IMIPENEM <=0.25 SENSITIVE Sensitive     NITROFURANTOIN 32 SENSITIVE Sensitive     TRIMETH/SULFA <=20 SENSITIVE Sensitive     AMPICILLIN/SULBACTAM 4 SENSITIVE Sensitive     PIP/TAZO <=4 SENSITIVE Sensitive     * >=100,000 COLONIES/mL ESCHERICHIA COLI   Culture, blood (single)     Status: None (Preliminary result)   Collection Time: 12/07/20 10:04 AM   Specimen: BLOOD RIGHT ARM  Result Value Ref Range Status   Specimen Description BLOOD RIGHT ARM  Final   Special Requests  Final    BOTTLES DRAWN AEROBIC ONLY Blood Culture results may not be optimal due to an inadequate volume of blood received in culture bottles   Culture   Final    NO GROWTH 1 DAY Performed at [facility], [address] [place], [place] [address]    Report Status PENDING  Incomplete         Radiology Studies: No results found.      Scheduled Meds: . aspirin EC  81 mg Oral Daily  . cephALEXin  500 mg Oral Q8H  . ezetimibe  10 mg Oral Daily  . levothyroxine  100 mcg Oral Q0600   Continuous Infusions: . lactated ringers 100 mL/hr at 12/08/20 0234     LOS: 3 days    Time spent: 34 minutes    Dorcas Carrow, MD [facility] Pager [phone]

## 2020-12-08 NOTE — Care Management Important Message (Signed)
Important Message  Patient Details  Name: Cassidy Austin MRN: [id number] Date of Birth: [birth year]   Medicare Important Message Given:  Yes     Oralia Rud Kaitlyne Friedhoff 12/08/2020, 2:34 PM

## 2020-12-08 NOTE — Progress Notes (Signed)
Physical Therapy Treatment Patient Details Name: Cassidy Austin MRN: [id number] DOB: [birth year] Today's Date: 12/08/2020    History of Present Illness Pt is a 70 y/o female admitted secondary to hematuria, fatigue/weakness, and chest pain, over 1-2 days. Found to have sepsis secondary to UTI. PMH includes HTN.    PT Comments    Pt fully participated in session. Pt with improved pain following LE exercises and standing. Increased time spent educating pt and nephew on current recommendation of SNF due to increased assist and nephew has a lifting limit due to back surgery. Pt requiring max A to transfer from recliner to standing, but min A with pulling up on stedy. Pt performed x 3 trials for strengthening. Pt continues to progress with mobility and will continue to benefit from skilled PT to address deficits in balance, strength, coordination, endurance, gait and safety to maximize independence with functional mobility prior to discharge. Pt will need to d/c SNF if > min A level needed for functional mobility due to limitations of nephew. If pt able to perform at min A level pt is safe to d/c home with nephew and HHPT.     Follow Up Recommendations  SNF;Supervision/Assistance - 24 hour     Equipment Recommendations  Wheelchair (measurements PT);Wheelchair cushion (measurements PT)    Recommendations for Other Services       Precautions / Restrictions Precautions Precautions: Fall Restrictions Weight Bearing Restrictions: No    Mobility  Bed Mobility                  Transfers                 General transfer comment: performed sit<>stand from recliner using Stedy to pull up on with min A from therapist. Attempted from recliner, pt unable able to perform without max A. Performed pull to stand x 3 trials with maintaining standing x 2 minutes with weight shifting for strengthening and weight bearing. Standing decreased pain in low back  Ambulation/Gait                  Stairs             Wheelchair Mobility    Modified Rankin (Stroke Patients Only)       Balance                                            Cognition                                              Exercises General Exercises - Lower Extremity Ankle Circles/Pumps: AROM;Both;10 reps;Supine Quad Sets: AROM;Both;10 reps;Supine Gluteal Sets: AROM;Both;10 reps;Supine Long Arc Quad: AROM;Both;10 reps;Seated Heel Slides: AAROM;Both;10 reps;Supine Hip ABduction/ADduction: AAROM;Both;10 reps;Supine Straight Leg Raises: AAROM;Both;10 reps;Supine Hip Flexion/Marching: AROM;Both;10 reps;Seated    General Comments        Pertinent Vitals/Pain Pain Assessment: 0-10 Pain Score: 10-Worst pain ever Pain Location: low back (improved once standing)    Home Living                      Prior Function            PT Goals (current goals can now be found in the care plan section)  Acute Rehab PT Goals Patient Stated Goal: to decrease pain PT Goal Formulation: With patient Time For Goal Achievement: 12/20/20 Potential to Achieve Goals: Good Progress towards PT goals: Progressing toward goals    Frequency    Min 2X/week      PT Plan Current plan remains appropriate    Co-evaluation              AM-PAC PT "6 Clicks" Mobility   Outcome Measure  Help needed turning from your back to your side while in a flat bed without using bedrails?: A Lot Help needed moving from lying on your back to sitting on the side of a flat bed without using bedrails?: A Lot Help needed moving to and from a bed to a chair (including a wheelchair)?: A Lot Help needed standing up from a chair using your arms (e.g., wheelchair or bedside chair)?: A Lot Help needed to walk in hospital room?: Total Help needed climbing 3-5 steps with a railing? : Total 6 Click Score: 10    End of Session Equipment Utilized During Treatment: Gait  belt Activity Tolerance: Patient tolerated treatment well Patient left: in chair;with call bell/phone within reach Nurse Communication: Mobility status PT Visit Diagnosis: Unsteadiness on feet (R26.81);Muscle weakness (generalized) (M62.81);Difficulty in walking, not elsewhere classified (R26.2);Pain Pain - part of body:  (back)     Time: 2706-2376 PT Time Calculation (min) (ACUTE ONLY): 44 min  Charges:  $Therapeutic Exercise: 23-37 mins $Therapeutic Activity: 8-22 mins                     Ginette Otto, DPT Acute Rehabilitation Services [phone]   Lucretia Field 12/08/2020, 12:06 PM

## 2020-12-09 LAB — CBC WITH DIFFERENTIAL/PLATELET
Abs Immature Granulocytes: 1.65 10*3/uL — ABNORMAL HIGH (ref 0.00–0.07)
Basophils Absolute: 0.1 10*3/uL (ref 0.0–0.1)
Basophils Relative: 0 %
Eosinophils Absolute: 0.3 10*3/uL (ref 0.0–0.5)
Eosinophils Relative: 1 %
HCT: 25.7 % — ABNORMAL LOW (ref 36.0–46.0)
Hemoglobin: 9.2 g/dL — ABNORMAL LOW (ref 12.0–15.0)
Immature Granulocytes: 8 %
Lymphocytes Relative: 6 %
Lymphs Abs: 1.2 10*3/uL (ref 0.7–4.0)
MCH: 29 pg (ref 26.0–34.0)
MCHC: 35.8 g/dL (ref 30.0–36.0)
MCV: 81.1 fL (ref 80.0–100.0)
Monocytes Absolute: 1 10*3/uL (ref 0.1–1.0)
Monocytes Relative: 5 %
Neutro Abs: 15.6 10*3/uL — ABNORMAL HIGH (ref 1.7–7.7)
Neutrophils Relative %: 80 %
Platelets: 182 10*3/uL (ref 150–400)
RBC: 3.17 MIL/uL — ABNORMAL LOW (ref 3.87–5.11)
RDW: 15.3 % (ref 11.5–15.5)
WBC: 19.1 10*3/uL — ABNORMAL HIGH (ref 4.0–10.5)
nRBC: 0 % (ref 0.0–0.2)

## 2020-12-09 LAB — BASIC METABOLIC PANEL
Anion gap: 11 (ref 5–15)
BUN: 68 mg/dL — ABNORMAL HIGH (ref 8–23)
CO2: 20 mmol/L — ABNORMAL LOW (ref 22–32)
Calcium: 7.7 mg/dL — ABNORMAL LOW (ref 8.9–10.3)
Chloride: 104 mmol/L (ref 98–111)
Creatinine, Ser: 2.04 mg/dL — ABNORMAL HIGH (ref 0.44–1.00)
GFR, Estimated: 26 mL/min — ABNORMAL LOW (ref >60)
Glucose, Bld: 134 mg/dL — ABNORMAL HIGH (ref 70–99)
Potassium: 4.1 mmol/L (ref 3.5–5.1)
Sodium: 135 mmol/L (ref 135–145)

## 2020-12-09 MED ORDER — LIDOCAINE 5 % EX PTCH
1.0000 | MEDICATED_PATCH | CUTANEOUS | Status: DC
  Administered 2020-12-09 – 2020-12-13 (×5): 1 via TRANSDERMAL
  Filled 2020-12-09 (×7): qty 1

## 2020-12-09 MED ORDER — OXYCODONE HCL 5 MG PO TABS
5.0000 mg | ORAL_TABLET | Freq: Four times a day (QID) | ORAL | Status: DC | PRN
  Administered 2020-12-09 – 2020-12-13 (×12): 5 mg via ORAL
  Filled 2020-12-09 (×13): qty 1

## 2020-12-09 NOTE — TOC Progression Note (Signed)
Transition of Care [facility]) - Progression Note    Patient Details  Name: Cassidy Austin MRN: [id number] Date of Birth: [birth year]  [facility]) CM/SW Contact  Annalee Genta, LCSW Phone Number: 12/09/2020, 10:51 AM  Clinical Narrative: CSW provided patient with SNF bed offers for [facility] and [facility]. CSW notes not having heard from any [facility] at this time. CSW noted patient request to refer to some other facilities towards that area. CSW referred patient further out. CSW notes insurance auth and confirming bed selection remain discharge barriers.      Expected Discharge Plan: Skilled Nursing Facility Barriers to Discharge: Insurance Authorization,Continued Medical Work up  Expected Discharge Plan and Services Expected Discharge Plan: Skilled Nursing Facility       Living arrangements for the past 2 months: Single Family Home                                       Social Determinants of Health (SDOH) Interventions    Readmission Risk Interventions No flowsheet data found.

## 2020-12-09 NOTE — Progress Notes (Signed)
PROGRESS NOTE    Cassidy Austin  HUD:[id number] DOB: [birth year] DOA: 12/04/2020 PCP: Laurann Montana, MD    Brief Narrative:  70 year old female with history of hypertension, resume home venous insufficiency, morbid obesity presented with hematuria, fatigue weakness and chest discomfort for 2 days.  She was found to have UTI, minimally elevated troponins, acute kidney injury with recent normal baseline renal functions. Patient recently doing manual work at home and significant back pain.   Assessment & Plan:   Principal Problem:   Chest pain Active Problems:   Hypothyroidism   Hypertension   Hypercholesterolemia   Renal insufficiency   Normocytic anemia   Thrombocytopenia (HCC)   Leukocytosis  Acute kidney injury: Lab Results  Component Value Date   CREATININE 2.04 (H) 12/09/2020   CREATININE 2.38 (H) 12/08/2020   CREATININE 2.44 (H) 12/07/2020   Her creatinine is gradually improving and normalizing.  Urine output is adequate.  This is probably prerenal with dehydration, ongoing use of Lasix and olmesartan. Renal ultrasound with no evidence of hydronephrosis or obstruction. Recent outpatient kidney function test on 7/21 with normal. Continue isotonic fluid today, recheck renal functions tomorrow morning.  Severe sepsis due to E. coli UTI: Present on admission.  Resolved.  Blood cultures negative.  Urine culture with pansensitive E. coli.  Treated with Rocephin, changed to oral cephalosporin for 5 days.  Atypical chest pain with demand ischemia: Evaluated by cardiology.  Echocardiogram was normal.  If recurrent chest pain suggested nuclear stress test.  Currently no indication for treatment.  Essential hypertension: Currently on carvedilol.  Blood pressures normal or low normal.  Transaminitis: Mild elevated transaminases probably due to transient shock liver.  Improved and normalized.  Hypothyroidism: Clinically euthyroid on current dose of  thyroxine.  Debility/deconditioning: Work with PT OT.  She will benefit with short-term rehab.  Refer to SNF.  Back pain: Localized back pain after manual work at home.  No evidence of complication.  Will use local lidocaine patch and oxycodone for pain relief, avoid NSAIDs.  Continue to work with PT OT.   DVT prophylaxis: SCDs Start: 12/05/20 0118   Code Status: Full code Family Communication: None.  Patient is communicating. Disposition Plan: Status is: Inpatient  Remains inpatient appropriate because:Inpatient level of care appropriate due to severity of illness   Dispo: The patient is from: Home              Anticipated d/c is to: SNF              Anticipated d/c date is: Whenever bed available.              Patient currently is medically stable.   Difficult to place patient No         Consultants:   None  Procedures:   None   Antimicrobials:  Anti-infectives (From admission, onward)   Start     Dose/Rate Route Frequency Ordered Stop   12/08/20 0930  cephALEXin (KEFLEX) capsule 500 mg        500 mg Oral Every 8 hours 12/08/20 0833 12/13/20 0559   12/06/20 1230  cefTRIAXone (ROCEPHIN) 1 g in sodium chloride 0.9 % 100 mL IVPB  Status:  Discontinued        1 g 200 mL/hr over 30 Minutes Intravenous Every 24 hours 12/06/20 1117 12/08/20 0833         Subjective: Patient seen and examined.  Denies any complaints other than significant low back pain and discomfort with mobility.  Afebrile.  Objective: Vitals:   12/08/20 1838 12/08/20 2309 12/09/20 0555 12/09/20 1202  BP: 121/63 (!) 107/55 119/62 (!) 106/51  Pulse: 68 71 60 65  Resp: 16 20 18 17   Temp: 99.1 F (37.3 C) 98 F (36.7 C) 98.4 F (36.9 C) 98.6 F (37 C)  TempSrc: Oral Oral Oral Oral  SpO2: 97% 92% 100% 98%  Weight:      Height:        Intake/Output Summary (Last 24 hours) at 12/09/2020 1346 Last data filed at 12/09/2020 1114 Gross per 24 hour  Intake 360 ml  Output 2350 ml  Net -1990 ml    Filed Weights   12/04/20 1732 12/06/20 0637 12/07/20 0558  Weight: 122.5 kg 127.3 kg 127.9 kg    Examination:  General exam: Appears calm and comfortable  Comfortable on room air. Respiratory system: Clear to auscultation. Respiratory effort normal. Cardiovascular system: S1 & S2 heard, RRR. No JVD, murmurs, rubs, gallops or clicks. No pedal edema. Gastrointestinal system: Abdomen is nondistended, soft and nontender. No organomegaly or masses felt. Normal bowel sounds heard.  Obese and pendulous. Central nervous system: Alert and oriented. No focal neurological deficits. Extremities: Symmetric 5 x 5 power. Skin: No rashes, lesions or ulcers No palpable deformity or tenderness on the low back.  She has generalized pain on the paraspinal muscles.    Data Reviewed: I have personally reviewed following labs and imaging studies  CBC: Recent Labs  Lab 12/05/20 0424 12/06/20 0311 12/07/20 0127 12/08/20 0755 12/09/20 0026  WBC 16.6* 12.4* 11.9* 18.5* 19.1*  NEUTROABS 15.0*  --   --  14.6* 15.6*  HGB 10.5* 9.2* 9.1* 8.9* 9.2*  HCT 31.5* 25.4* 25.0* 26.0* 25.7*  MCV 85.1 80.6 80.6 82.0 81.1  PLT 108* 98* 111* 161 182   Basic Metabolic Panel: Recent Labs  Lab 12/05/20 0424 12/06/20 0311 12/07/20 0127 12/08/20 0755 12/09/20 0026  NA 135 135 136 136 135  K 3.5 3.5 3.5 3.3* 4.1  CL 100 100 103 105 104  CO2 22 22 22 22  20*  GLUCOSE 144* 138* 109* 122* 134*  BUN 93* 93* 83* 79* 68*  CREATININE 3.76* 3.21* 2.44* 2.38* 2.04*  CALCIUM 8.0* 7.6* 7.3* 7.7* 7.7*  MG 2.7*  --   --  2.7*  --   PHOS 4.0  --   --   --   --    GFR: Estimated Creatinine Clearance: 34.6 mL/min (A) (by C-G formula based on SCr of 2.04 mg/dL (H)). Liver Function Tests: Recent Labs  Lab 12/05/20 0424 12/08/20 0755  AST 58* 28  ALT 56* 46*  ALKPHOS 74 91  BILITOT 1.3* 0.7  PROT 6.6 6.2*  ALBUMIN 2.4* 1.9*   No results for input(s): LIPASE, AMYLASE in the last 168 hours. No results for  input(s): AMMONIA in the last 168 hours. Coagulation Profile: No results for input(s): INR, PROTIME in the last 168 hours. Cardiac Enzymes: No results for input(s): CKTOTAL, CKMB, CKMBINDEX, TROPONINI in the last 168 hours. BNP (last 3 results) No results for input(s): PROBNP in the last 8760 hours. HbA1C: No results for input(s): HGBA1C in the last 72 hours. CBG: No results for input(s): GLUCAP in the last 168 hours. Lipid Profile: No results for input(s): CHOL, HDL, LDLCALC, TRIG, CHOLHDL, LDLDIRECT in the last 72 hours. Thyroid Function Tests: No results for input(s): TSH, T4TOTAL, FREET4, T3FREE, THYROIDAB in the last 72 hours. Anemia Panel: No results for input(s): VITAMINB12, FOLATE, FERRITIN, TIBC, IRON, RETICCTPCT in the last  72 hours. Sepsis Labs: Recent Labs  Lab 12/07/20 1004  LATICACIDVEN 1.6    Recent Results (from the past 240 hour(s))  SARS Coronavirus 2 by RT PCR (hospital order, performed in [facility] hospital lab) Nasopharyngeal Nasopharyngeal Swab     Status: None   Collection Time: 12/05/20 12:26 AM   Specimen: Nasopharyngeal Swab  Result Value Ref Range Status   SARS Coronavirus 2 NEGATIVE NEGATIVE Final    Comment: (NOTE) SARS-CoV-2 target nucleic acids are NOT DETECTED.  The SARS-CoV-2 RNA is generally detectable in upper and lower respiratory specimens during the acute phase of infection. The lowest concentration of SARS-CoV-2 viral copies this assay can detect is 250 copies / mL. A negative result does not preclude SARS-CoV-2 infection and should not be used as the sole basis for treatment or other patient management decisions.  A negative result may occur with improper specimen collection / handling, submission of specimen other than nasopharyngeal swab, presence of viral mutation(s) within the areas targeted by this assay, and inadequate number of viral copies (<250 copies / mL). A negative result must be combined with clinical observations,  patient history, and epidemiological information.  Fact Sheet for Patients:   BoilerBrush.com.cy  Fact Sheet for Healthcare Providers: https://pope.com/  This test is not yet approved or  cleared by the [place] FDA and has been authorized for detection and/or diagnosis of SARS-CoV-2 by FDA under an Emergency Use Authorization (EUA).  This EUA will remain in effect (meaning this test can be used) for the duration of the COVID-19 declaration under Section 564(b)(1) of the Act, 21 U.S.C. section 360bbb-3(b)(1), unless the authorization is terminated or revoked sooner.  Performed at [facility], [address], [place], [place] [address]   Culture, Urine     Status: Abnormal   Collection Time: 12/06/20  9:59 AM   Specimen: Urine, Clean Catch  Result Value Ref Range Status   Specimen Description URINE, CLEAN CATCH  Final   Special Requests   Final    NONE Performed at [facility], [address] [place], [place] [address]    Culture (A)  Final    >=100,000 COLONIES/mL PROTEUS MIRABILIS >=100,000 COLONIES/mL ESCHERICHIA COLI    Report Status 12/08/2020 FINAL  Final   Organism ID, Bacteria PROTEUS MIRABILIS (A)  Final   Organism ID, Bacteria ESCHERICHIA COLI (A)  Final      Susceptibility   Escherichia coli - MIC*    AMPICILLIN 4 SENSITIVE Sensitive     CEFAZOLIN <=4 SENSITIVE Sensitive     CEFEPIME <=0.12 SENSITIVE Sensitive     CEFTRIAXONE <=0.25 SENSITIVE Sensitive     CIPROFLOXACIN <=0.25 SENSITIVE Sensitive     GENTAMICIN <=1 SENSITIVE Sensitive     IMIPENEM <=0.25 SENSITIVE Sensitive     NITROFURANTOIN <=16 SENSITIVE Sensitive     TRIMETH/SULFA <=20 SENSITIVE Sensitive     AMPICILLIN/SULBACTAM <=2 SENSITIVE Sensitive     PIP/TAZO <=4 SENSITIVE Sensitive     * >=100,000 COLONIES/mL ESCHERICHIA COLI   Proteus mirabilis - MIC*    AMPICILLIN <=2 SENSITIVE Sensitive     CEFAZOLIN <=4 SENSITIVE Sensitive      CEFEPIME <=0.12 SENSITIVE Sensitive     CEFTRIAXONE <=0.25 SENSITIVE Sensitive     CIPROFLOXACIN <=0.25 SENSITIVE Sensitive     GENTAMICIN <=1 SENSITIVE Sensitive     IMIPENEM 1 SENSITIVE Sensitive     NITROFURANTOIN 128 RESISTANT Resistant     TRIMETH/SULFA <=20 SENSITIVE Sensitive     AMPICILLIN/SULBACTAM <=  2 SENSITIVE Sensitive     PIP/TAZO <=4 SENSITIVE Sensitive     * >=100,000 COLONIES/mL PROTEUS MIRABILIS  Culture, Urine     Status: Abnormal   Collection Time: 12/06/20  4:06 PM   Specimen: Urine, Random  Result Value Ref Range Status   Specimen Description URINE, RANDOM  Final   Special Requests   Final    NONE Performed at [facility], [address], [place], [place] [address]    Culture >=100,000 COLONIES/mL ESCHERICHIA COLI (A)  Final   Report Status 12/08/2020 FINAL  Final   Organism ID, Bacteria ESCHERICHIA COLI (A)  Final      Susceptibility   Escherichia coli - MIC*    AMPICILLIN 8 SENSITIVE Sensitive     CEFAZOLIN <=4 SENSITIVE Sensitive     CEFEPIME <=0.12 SENSITIVE Sensitive     CEFTRIAXONE <=0.25 SENSITIVE Sensitive     CIPROFLOXACIN <=0.25 SENSITIVE Sensitive     GENTAMICIN <=1 SENSITIVE Sensitive     IMIPENEM <=0.25 SENSITIVE Sensitive     NITROFURANTOIN 32 SENSITIVE Sensitive     TRIMETH/SULFA <=20 SENSITIVE Sensitive     AMPICILLIN/SULBACTAM 4 SENSITIVE Sensitive     PIP/TAZO <=4 SENSITIVE Sensitive     * >=100,000 COLONIES/mL ESCHERICHIA COLI  Culture, blood (single)     Status: None (Preliminary result)   Collection Time: 12/07/20 10:04 AM   Specimen: BLOOD RIGHT ARM  Result Value Ref Range Status   Specimen Description BLOOD RIGHT ARM  Final   Special Requests   Final    BOTTLES DRAWN AEROBIC ONLY Blood Culture results may not be optimal due to an inadequate volume of blood received in culture bottles   Culture   Final    NO GROWTH 2 DAYS Performed at [facility], [address] [place], [place] [address]    Report Status  PENDING  Incomplete         Radiology Studies: No results found.      Scheduled Meds: . aspirin EC  81 mg Oral Daily  . cephALEXin  500 mg Oral Q8H  . ezetimibe  10 mg Oral Daily  . levothyroxine  100 mcg Oral Q0600  . lidocaine  1 patch Transdermal Q24H  . potassium chloride  20 mEq Oral BID   Continuous Infusions: . lactated ringers 100 mL/hr at 12/09/20 0143     LOS: 4 days    Time spent: 30 minutes    Dorcas Carrow, MD [facility] Pager [phone]

## 2020-12-10 LAB — CBC WITH DIFFERENTIAL/PLATELET
Abs Immature Granulocytes: 1.46 10*3/uL — ABNORMAL HIGH (ref 0.00–0.07)
Basophils Absolute: 0.1 10*3/uL (ref 0.0–0.1)
Basophils Relative: 0 %
Eosinophils Absolute: 0.2 10*3/uL (ref 0.0–0.5)
Eosinophils Relative: 1 %
HCT: 28 % — ABNORMAL LOW (ref 36.0–46.0)
Hemoglobin: 9.5 g/dL — ABNORMAL LOW (ref 12.0–15.0)
Immature Granulocytes: 8 %
Lymphocytes Relative: 7 %
Lymphs Abs: 1.3 10*3/uL (ref 0.7–4.0)
MCH: 28.2 pg (ref 26.0–34.0)
MCHC: 33.9 g/dL (ref 30.0–36.0)
MCV: 83.1 fL (ref 80.0–100.0)
Monocytes Absolute: 0.7 10*3/uL (ref 0.1–1.0)
Monocytes Relative: 4 %
Neutro Abs: 14.4 10*3/uL — ABNORMAL HIGH (ref 1.7–7.7)
Neutrophils Relative %: 80 %
Platelets: 223 10*3/uL (ref 150–400)
RBC: 3.37 MIL/uL — ABNORMAL LOW (ref 3.87–5.11)
RDW: 15.7 % — ABNORMAL HIGH (ref 11.5–15.5)
WBC: 18.2 10*3/uL — ABNORMAL HIGH (ref 4.0–10.5)
nRBC: 0 % (ref 0.0–0.2)

## 2020-12-10 LAB — BASIC METABOLIC PANEL
Anion gap: 9 (ref 5–15)
BUN: 46 mg/dL — ABNORMAL HIGH (ref 8–23)
CO2: 23 mmol/L (ref 22–32)
Calcium: 8 mg/dL — ABNORMAL LOW (ref 8.9–10.3)
Chloride: 107 mmol/L (ref 98–111)
Creatinine, Ser: 1.53 mg/dL — ABNORMAL HIGH (ref 0.44–1.00)
GFR, Estimated: 36 mL/min — ABNORMAL LOW (ref >60)
Glucose, Bld: 106 mg/dL — ABNORMAL HIGH (ref 70–99)
Potassium: 4.9 mmol/L (ref 3.5–5.1)
Sodium: 139 mmol/L (ref 135–145)

## 2020-12-10 NOTE — Progress Notes (Signed)
PROGRESS NOTE    Cassidy Austin  UXL:[id number] DOB: 09-18-1951 DOA: 12/04/2020 PCP: Laurann Montana, MD    Brief Narrative:  70 year old female with history of hypertension, venous insufficiency, morbid obesity presented with hematuria, fatigue ,weakness and chest discomfort for 2 days.  She was found to have UTI, minimally elevated troponins, acute kidney injury with recent normal baseline renal functions. Patient recently doing manual work at home and significant back pain.   Assessment & Plan:   Principal Problem:   Chest pain Active Problems:   Hypothyroidism   Hypertension   Hypercholesterolemia   Renal insufficiency   Normocytic anemia   Thrombocytopenia (HCC)   Leukocytosis  Acute kidney injury: Lab Results  Component Value Date   CREATININE 1.53 (H) 12/10/2020   CREATININE 2.04 (H) 12/09/2020   CREATININE 2.38 (H) 12/08/2020   Her creatinine is gradually improving and normalizing.  Urine output is adequate.  This is probably prerenal with dehydration, ongoing use of Lasix and olmesartan. Renal ultrasound with no evidence of hydronephrosis or obstruction. Recent outpatient kidney function test on 7/21 with normal. Able to hydrate by mouth, discontinue IV fluids.  Severe sepsis due to E. coli UTI: Present on admission.  Resolved.  Blood cultures negative.  Urine culture with pansensitive E. coli.  Treated with Rocephin, changed to oral cephalosporin for 5 days.  Atypical chest pain with demand ischemia: Evaluated by cardiology.  Echocardiogram was normal.  If recurrent chest pain suggested nuclear stress test.  Currently no indication for treatment.  Essential hypertension: Currently on carvedilol.  Blood pressures normal or low normal.  Transaminitis: Mild elevated transaminases probably due to transient shock liver.  Improved and normalized.  Hypothyroidism: Clinically euthyroid on current dose of thyroxine.  Debility/deconditioning: Work with PT OT.  She will  benefit with short-term rehab.  Refer to SNF.  Back pain: Localized back pain after manual work at home.  No evidence of complication.  Will use local lidocaine patch and oxycodone for pain relief, avoid NSAIDs.  Continue to work with PT OT.   DVT prophylaxis: SCDs Start: 12/05/20 0118   Code Status: Full code Family Communication: None.  Patient is communicating. Disposition Plan: Status is: Inpatient  Remains inpatient appropriate because:Inpatient level of care appropriate due to severity of illness   Dispo: The patient is from: Home              Anticipated d/c is to: SNF              Anticipated d/c date is: Whenever bed available.              Patient currently is medically stable.   Difficult to place patient No         Consultants:   None  Procedures:   None   Antimicrobials:  Anti-infectives (From admission, onward)   Start     Dose/Rate Route Frequency Ordered Stop   12/08/20 0930  cephALEXin (KEFLEX) capsule 500 mg        500 mg Oral Every 8 hours 12/08/20 0833 12/13/20 0559   12/06/20 1230  cefTRIAXone (ROCEPHIN) 1 g in sodium chloride 0.9 % 100 mL IVPB  Status:  Discontinued        1 g 200 mL/hr over 30 Minutes Intravenous Every 24 hours 12/06/20 1117 12/08/20 0833         Subjective: Patient seen and examined.  Lidocaine patch and oxycodone did help with the pain.  She is looking forward to work with therapy today.  No other overnight events.  Denies any nausea vomiting.  Able to drink well.  No urinary symptoms.  Objective: Vitals:   12/09/20 0555 12/09/20 1202 12/09/20 2258 12/10/20 0457  BP: 119/62 (!) 106/51 (!) 135/58 113/72  Pulse: 60 65 64 68  Resp: 18 17 18 18   Temp: 98.4 F (36.9 C) 98.6 F (37 C) 98.6 F (37 C) 98.5 F (36.9 C)  TempSrc: Oral Oral Oral Oral  SpO2: 100% 98% 99% 100%  Weight:      Height:        Intake/Output Summary (Last 24 hours) at 12/10/2020 1159 Last data filed at 12/10/2020 0845 Gross per 24 hour  Intake  5967.92 ml  Output 1850 ml  Net 4117.92 ml   Filed Weights   12/04/20 1732 12/06/20 0637 12/07/20 0558  Weight: 122.5 kg 127.3 kg 127.9 kg    Examination:  General exam: Appears calm and comfortable  Comfortable on room air.  Sitting in couch.  She sleeps in couch at home. Respiratory system: Clear to auscultation. Respiratory effort normal. Cardiovascular system: S1 & S2 heard, RRR. No JVD, murmurs, rubs, gallops or clicks. No pedal edema. Gastrointestinal system: Abdomen is nondistended, soft and nontender. No organomegaly or masses felt. Normal bowel sounds heard.  Obese and pendulous. Central nervous system: Alert and oriented. No focal neurological deficits. Extremities: Symmetric 5 x 5 power. Skin: No rashes, lesions or ulcers No palpable deformity or tenderness on the low back.  She has generalized pain on the paraspinal muscles.    Data Reviewed: I have personally reviewed following labs and imaging studies  CBC: Recent Labs  Lab 12/05/20 0424 12/06/20 0311 12/07/20 0127 12/08/20 0755 12/09/20 0026 12/10/20 0053  WBC 16.6* 12.4* 11.9* 18.5* 19.1* 18.2*  NEUTROABS 15.0*  --   --  14.6* 15.6* 14.4*  HGB 10.5* 9.2* 9.1* 8.9* 9.2* 9.5*  HCT 31.5* 25.4* 25.0* 26.0* 25.7* 28.0*  MCV 85.1 80.6 80.6 82.0 81.1 83.1  PLT 108* 98* 111* 161 182 223   Basic Metabolic Panel: Recent Labs  Lab 12/05/20 0424 12/06/20 0311 12/07/20 0127 12/08/20 0755 12/09/20 0026 12/10/20 0053  NA 135 135 136 136 135 139  K 3.5 3.5 3.5 3.3* 4.1 4.9  CL 100 100 103 105 104 107  CO2 22 22 22 22  20* 23  GLUCOSE 144* 138* 109* 122* 134* 106*  BUN 93* 93* 83* 79* 68* 46*  CREATININE 3.76* 3.21* 2.44* 2.38* 2.04* 1.53*  CALCIUM 8.0* 7.6* 7.3* 7.7* 7.7* 8.0*  MG 2.7*  --   --  2.7*  --   --   PHOS 4.0  --   --   --   --   --    GFR: Estimated Creatinine Clearance: 46.2 mL/min (A) (by C-G formula based on SCr of 1.53 mg/dL (H)). Liver Function Tests: Recent Labs  Lab 12/05/20 0424  12/08/20 0755  AST 58* 28  ALT 56* 46*  ALKPHOS 74 91  BILITOT 1.3* 0.7  PROT 6.6 6.2*  ALBUMIN 2.4* 1.9*   No results for input(s): LIPASE, AMYLASE in the last 168 hours. No results for input(s): AMMONIA in the last 168 hours. Coagulation Profile: No results for input(s): INR, PROTIME in the last 168 hours. Cardiac Enzymes: No results for input(s): CKTOTAL, CKMB, CKMBINDEX, TROPONINI in the last 168 hours. BNP (last 3 results) No results for input(s): PROBNP in the last 8760 hours. HbA1C: No results for input(s): HGBA1C in the last 72 hours. CBG: No results for input(s): GLUCAP  in the last 168 hours. Lipid Profile: No results for input(s): CHOL, HDL, LDLCALC, TRIG, CHOLHDL, LDLDIRECT in the last 72 hours. Thyroid Function Tests: No results for input(s): TSH, T4TOTAL, FREET4, T3FREE, THYROIDAB in the last 72 hours. Anemia Panel: No results for input(s): VITAMINB12, FOLATE, FERRITIN, TIBC, IRON, RETICCTPCT in the last 72 hours. Sepsis Labs: Recent Labs  Lab 12/07/20 1004  LATICACIDVEN 1.6    Recent Results (from the past 240 hour(s))  SARS Coronavirus 2 by RT PCR (hospital order, performed in [facility] hospital lab) Nasopharyngeal Nasopharyngeal Swab     Status: None   Collection Time: 12/05/20 12:26 AM   Specimen: Nasopharyngeal Swab  Result Value Ref Range Status   SARS Coronavirus 2 NEGATIVE NEGATIVE Final    Comment: (NOTE) SARS-CoV-2 target nucleic acids are NOT DETECTED.  The SARS-CoV-2 RNA is generally detectable in upper and lower respiratory specimens during the acute phase of infection. The lowest concentration of SARS-CoV-2 viral copies this assay can detect is 250 copies / mL. A negative result does not preclude SARS-CoV-2 infection and should not be used as the sole basis for treatment or other patient management decisions.  A negative result may occur with improper specimen collection / handling, submission of specimen other than nasopharyngeal swab,  presence of viral mutation(s) within the areas targeted by this assay, and inadequate number of viral copies (<250 copies / mL). A negative result must be combined with clinical observations, patient history, and epidemiological information.  Fact Sheet for Patients:   BoilerBrush.com.cy  Fact Sheet for Healthcare Providers: https://pope.com/  This test is not yet approved or  cleared by the [place] FDA and has been authorized for detection and/or diagnosis of SARS-CoV-2 by FDA under an Emergency Use Authorization (EUA).  This EUA will remain in effect (meaning this test can be used) for the duration of the COVID-19 declaration under Section 564(b)(1) of the Act, 21 U.S.C. section 360bbb-3(b)(1), unless the authorization is terminated or revoked sooner.  Performed at [facility], [address], [place], [place] [address]   Culture, Urine     Status: Abnormal   Collection Time: 12/06/20  9:59 AM   Specimen: Urine, Clean Catch  Result Value Ref Range Status   Specimen Description URINE, CLEAN CATCH  Final   Special Requests   Final    NONE Performed at [facility], [address], [place], [place] [address]    Culture (A)  Final    >=100,000 COLONIES/mL PROTEUS MIRABILIS >=100,000 COLONIES/mL ESCHERICHIA COLI    Report Status 12/08/2020 FINAL  Final   Organism ID, Bacteria PROTEUS MIRABILIS (A)  Final   Organism ID, Bacteria ESCHERICHIA COLI (A)  Final      Susceptibility   Escherichia coli - MIC*    AMPICILLIN 4 SENSITIVE Sensitive     CEFAZOLIN <=4 SENSITIVE Sensitive     CEFEPIME <=0.12 SENSITIVE Sensitive     CEFTRIAXONE <=0.25 SENSITIVE Sensitive     CIPROFLOXACIN <=0.25 SENSITIVE Sensitive     GENTAMICIN <=1 SENSITIVE Sensitive     IMIPENEM <=0.25 SENSITIVE Sensitive     NITROFURANTOIN <=16 SENSITIVE Sensitive     TRIMETH/SULFA <=20 SENSITIVE Sensitive     AMPICILLIN/SULBACTAM <=2 SENSITIVE  Sensitive     PIP/TAZO <=4 SENSITIVE Sensitive     * >=100,000 COLONIES/mL ESCHERICHIA COLI   Proteus mirabilis - MIC*    AMPICILLIN <=2 SENSITIVE Sensitive     CEFAZOLIN <=4 SENSITIVE Sensitive     CEFEPIME <=0.12 SENSITIVE  Sensitive     CEFTRIAXONE <=0.25 SENSITIVE Sensitive     CIPROFLOXACIN <=0.25 SENSITIVE Sensitive     GENTAMICIN <=1 SENSITIVE Sensitive     IMIPENEM 1 SENSITIVE Sensitive     NITROFURANTOIN 128 RESISTANT Resistant     TRIMETH/SULFA <=20 SENSITIVE Sensitive     AMPICILLIN/SULBACTAM <=2 SENSITIVE Sensitive     PIP/TAZO <=4 SENSITIVE Sensitive     * >=100,000 COLONIES/mL PROTEUS MIRABILIS  Culture, Urine     Status: Abnormal   Collection Time: 12/06/20  4:06 PM   Specimen: Urine, Random  Result Value Ref Range Status   Specimen Description URINE, RANDOM  Final   Special Requests   Final    NONE Performed at [facility], [address], [place], [place] [address]    Culture >=100,000 COLONIES/mL ESCHERICHIA COLI (A)  Final   Report Status 12/08/2020 FINAL  Final   Organism ID, Bacteria ESCHERICHIA COLI (A)  Final      Susceptibility   Escherichia coli - MIC*    AMPICILLIN 8 SENSITIVE Sensitive     CEFAZOLIN <=4 SENSITIVE Sensitive     CEFEPIME <=0.12 SENSITIVE Sensitive     CEFTRIAXONE <=0.25 SENSITIVE Sensitive     CIPROFLOXACIN <=0.25 SENSITIVE Sensitive     GENTAMICIN <=1 SENSITIVE Sensitive     IMIPENEM <=0.25 SENSITIVE Sensitive     NITROFURANTOIN 32 SENSITIVE Sensitive     TRIMETH/SULFA <=20 SENSITIVE Sensitive     AMPICILLIN/SULBACTAM 4 SENSITIVE Sensitive     PIP/TAZO <=4 SENSITIVE Sensitive     * >=100,000 COLONIES/mL ESCHERICHIA COLI  Culture, blood (single)     Status: None (Preliminary result)   Collection Time: 12/07/20 10:04 AM   Specimen: BLOOD RIGHT ARM  Result Value Ref Range Status   Specimen Description BLOOD RIGHT ARM  Final   Special Requests   Final    BOTTLES DRAWN AEROBIC ONLY Blood Culture results may not be optimal due  to an inadequate volume of blood received in culture bottles   Culture   Final    NO GROWTH 2 DAYS Performed at [facility], [address], [place], [place] [address]    Report Status PENDING  Incomplete         Radiology Studies: No results found.      Scheduled Meds: . aspirin EC  81 mg Oral Daily  . cephALEXin  500 mg Oral Q8H  . ezetimibe  10 mg Oral Daily  . levothyroxine  100 mcg Oral Q0600  . lidocaine  1 patch Transdermal Q24H   Continuous Infusions:    LOS: 5 days    Time spent: 30 minutes    Dorcas Carrow, MD [facility] Pager [phone]

## 2020-12-10 NOTE — Progress Notes (Addendum)
Patient requests to sleep in chair and voices that she also sleep in chair at home. Stedy lift used for repositioned in chair and peri care. Patient unable to stand up greater without assistive device. Unable to weight at this time.

## 2020-12-11 ENCOUNTER — Inpatient Hospital Stay (HOSPITAL_COMMUNITY): Payer: Medicare PPO

## 2020-12-11 DIAGNOSIS — L8990 Pressure ulcer of unspecified site, unspecified stage: Secondary | ICD-10-CM | POA: Insufficient documentation

## 2020-12-11 LAB — BASIC METABOLIC PANEL
Anion gap: 9 (ref 5–15)
BUN: 32 mg/dL — ABNORMAL HIGH (ref 8–23)
CO2: 25 mmol/L (ref 22–32)
Calcium: 8.2 mg/dL — ABNORMAL LOW (ref 8.9–10.3)
Chloride: 105 mmol/L (ref 98–111)
Creatinine, Ser: 1.48 mg/dL — ABNORMAL HIGH (ref 0.44–1.00)
GFR, Estimated: 38 mL/min — ABNORMAL LOW (ref >60)
Glucose, Bld: 94 mg/dL (ref 70–99)
Potassium: 5 mmol/L (ref 3.5–5.1)
Sodium: 139 mmol/L (ref 135–145)

## 2020-12-11 LAB — CBC WITH DIFFERENTIAL/PLATELET
Abs Immature Granulocytes: 0.92 10*3/uL — ABNORMAL HIGH (ref 0.00–0.07)
Basophils Absolute: 0.1 10*3/uL (ref 0.0–0.1)
Basophils Relative: 0 %
Eosinophils Absolute: 0.1 10*3/uL (ref 0.0–0.5)
Eosinophils Relative: 1 %
HCT: 26.9 % — ABNORMAL LOW (ref 36.0–46.0)
Hemoglobin: 8.9 g/dL — ABNORMAL LOW (ref 12.0–15.0)
Immature Granulocytes: 5 %
Lymphocytes Relative: 7 %
Lymphs Abs: 1.2 10*3/uL (ref 0.7–4.0)
MCH: 27.7 pg (ref 26.0–34.0)
MCHC: 33.1 g/dL (ref 30.0–36.0)
MCV: 83.8 fL (ref 80.0–100.0)
Monocytes Absolute: 0.9 10*3/uL (ref 0.1–1.0)
Monocytes Relative: 5 %
Neutro Abs: 15.4 10*3/uL — ABNORMAL HIGH (ref 1.7–7.7)
Neutrophils Relative %: 82 %
Platelets: 259 10*3/uL (ref 150–400)
RBC: 3.21 MIL/uL — ABNORMAL LOW (ref 3.87–5.11)
RDW: 15.8 % — ABNORMAL HIGH (ref 11.5–15.5)
WBC: 18.6 10*3/uL — ABNORMAL HIGH (ref 4.0–10.5)
nRBC: 0 % (ref 0.0–0.2)

## 2020-12-11 MED ORDER — OXYCODONE HCL 5 MG PO TABS: 5.0000 mg | ORAL_TABLET | Freq: Four times a day (QID) | ORAL | 0 refills | Status: AC | PRN

## 2020-12-11 MED ORDER — CYCLOBENZAPRINE HCL 5 MG PO TABS: 5.0000 mg | ORAL_TABLET | Freq: Three times a day (TID) | ORAL | 0 refills | Status: DC | PRN

## 2020-12-11 MED ORDER — CYCLOBENZAPRINE HCL 5 MG PO TABS
5.0000 mg | ORAL_TABLET | Freq: Three times a day (TID) | ORAL | Status: DC
  Administered 2020-12-11 – 2020-12-13 (×7): 5 mg via ORAL
  Filled 2020-12-11 (×7): qty 1

## 2020-12-11 NOTE — Progress Notes (Signed)
PROGRESS NOTE    Cassidy Austin  FXT:[id number] DOB: 1951-04-17 DOA: 12/04/2020 PCP: Laurann Montana, MD    Brief Narrative:  70 year old female with history of hypertension, venous insufficiency, morbid obesity presented with hematuria, fatigue ,weakness and chest discomfort for 2 days.  She was found to have UTI, minimally elevated troponins, acute kidney injury with recent normal baseline renal functions. Patient recently doing manual work at home and significant back pain.   Assessment & Plan:   Principal Problem:   Chest pain Active Problems:   Hypothyroidism   Hypertension   Hypercholesterolemia   Renal insufficiency   Normocytic anemia   Thrombocytopenia (HCC)   Leukocytosis   Pressure injury of skin  Acute kidney injury: Lab Results  Component Value Date   CREATININE 1.48 (H) 12/11/2020   CREATININE 1.53 (H) 12/10/2020   CREATININE 2.04 (H) 12/09/2020   Her creatinine is gradually improving and normalizing.  Urine output is adequate.  This is probably prerenal with dehydration, ongoing use of Lasix and olmesartan. Renal ultrasound with no evidence of hydronephrosis or obstruction. Recent outpatient kidney function test on 7/21 with normal. Keeping of hydration by mouth.  Off IV fluids for now. A CT scan of the lumbar spine was done due to persistent back pain that showed right-sided hydronephrosis. Renal ultrasound 2/1 did not show any hydronephrosis.  Patient continues to have high white count, will check a dedicated CT scan of the abdomen pelvis.  Severe sepsis due to E. coli UTI: Present on admission.  Resolved.  Blood cultures negative.  Urine culture with pansensitive E. coli.  Treated with Rocephin, changed to oral cephalosporin for total 10 days.    Atypical chest pain with demand ischemia: Evaluated by cardiology.  Echocardiogram was normal.  If recurrent chest pain suggested nuclear stress test.  Currently no indication for treatment.  Essential  hypertension: Currently on carvedilol.  Blood pressures normal or low normal.  Transaminitis: Mild elevated transaminases probably due to transient shock liver.  Improved and normalized.  Hypothyroidism: Clinically euthyroid on current dose of thyroxine.  Debility/deconditioning: Work with PT OT.  She will benefit with short-term rehab.  Refer to SNF.  Back pain: Localized back pain after manual work at home.  No evidence of complication.  Will use local lidocaine patch and oxycodone for pain relief, avoid NSAIDs.  Continue to work with PT OT. Persistent pain, CT scan with chronic degenerative changes.  No acute disease or vertebral fracture.   DVT prophylaxis: SCDs Start: 12/05/20 0118   Code Status: Full code Family Communication: None.  Patient is communicating. Disposition Plan: Status is: Inpatient  Remains inpatient appropriate because:Inpatient level of care appropriate due to severity of illness   Dispo: The patient is from: Home              Anticipated d/c is to: SNF              Anticipated d/c date is: 1-2 days               Patient currently is medically stable.   Difficult to place patient No         Consultants:   None  Procedures:   None   Antimicrobials:  Anti-infectives (From admission, onward)   Start     Dose/Rate Route Frequency Ordered Stop   12/08/20 0930  cephALEXin (KEFLEX) capsule 500 mg        500 mg Oral Every 8 hours 12/08/20 0833 12/13/20 0559   12/06/20 1230  cefTRIAXone (ROCEPHIN) 1 g in sodium chloride 0.9 % 100 mL IVPB  Status:  Discontinued        1 g 200 mL/hr over 30 Minutes Intravenous Every 24 hours 12/06/20 1117 12/08/20 0833         Subjective: Patient was seen and examined.  Back pain remains the issue.  Oxycodone helping.  She is wearing lidocaine patches. Denies any urinary symptoms.  No nausea or vomiting.  Always sleeps on a couch upright.  Never goes to bed. Relief of back pain with oxycodone and combination of  Flexeril.  Objective: Vitals:   12/10/20 2326 12/11/20 0538 12/11/20 1003 12/11/20 1222  BP: (!) 126/57 (!) 131/58  130/61  Pulse: 64 72  81  Resp: 20 18  16   Temp: 98.8 F (37.1 C) 99.5 F (37.5 C)  97.8 F (36.6 C)  TempSrc: Oral Oral  Oral  SpO2: 99% 99%  95%  Weight:   134.9 kg   Height:        Intake/Output Summary (Last 24 hours) at 12/11/2020 1351 Last data filed at 12/11/2020 1113 Gross per 24 hour  Intake 120 ml  Output 1850 ml  Net -1730 ml   Filed Weights   12/06/20 0637 12/07/20 0558 12/11/20 1003  Weight: 127.3 kg 127.9 kg 134.9 kg    Examination:  General exam: Appears calm and comfortable  Comfortable on room air.  Sitting in couch.  She sleeps in couch at home. Respiratory system: Clear to auscultation. Respiratory effort normal. Cardiovascular system: S1 & S2 heard, RRR. No JVD, murmurs, rubs, gallops or clicks. No pedal edema. Gastrointestinal system: Abdomen is nondistended, soft and nontender. No organomegaly or masses felt. Normal bowel sounds heard.  Obese and pendulous. Central nervous system: Alert and oriented. No focal neurological deficits. Extremities: Symmetric 5 x 5 power. Skin: No rashes, lesions or ulcers No palpable deformity or tenderness on the low back.  She has generalized pain on the paraspinal muscles.    Data Reviewed: I have personally reviewed following labs and imaging studies  CBC: Recent Labs  Lab 12/05/20 0424 12/06/20 0311 12/07/20 0127 12/08/20 0755 12/09/20 0026 12/10/20 0053 12/11/20 0427  WBC 16.6*   < > 11.9* 18.5* 19.1* 18.2* 18.6*  NEUTROABS 15.0*  --   --  14.6* 15.6* 14.4* 15.4*  HGB 10.5*   < > 9.1* 8.9* 9.2* 9.5* 8.9*  HCT 31.5*   < > 25.0* 26.0* 25.7* 28.0* 26.9*  MCV 85.1   < > 80.6 82.0 81.1 83.1 83.8  PLT 108*   < > 111* 161 182 223 259   < > = values in this interval not displayed.   Basic Metabolic Panel: Recent Labs  Lab 12/05/20 0424 12/06/20 0311 12/07/20 0127 12/08/20 0755  12/09/20 0026 12/10/20 0053 12/11/20 0427  NA 135   < > 136 136 135 139 139  K 3.5   < > 3.5 3.3* 4.1 4.9 5.0  CL 100   < > 103 105 104 107 105  CO2 22   < > 22 22 20* 23 25  GLUCOSE 144*   < > 109* 122* 134* 106* 94  BUN 93*   < > 83* 79* 68* 46* 32*  CREATININE 3.76*   < > 2.44* 2.38* 2.04* 1.53* 1.48*  CALCIUM 8.0*   < > 7.3* 7.7* 7.7* 8.0* 8.2*  MG 2.7*  --   --  2.7*  --   --   --   PHOS 4.0  --   --   --   --   --   --    < > =  values in this interval not displayed.   GFR: Estimated Creatinine Clearance: 49.3 mL/min (A) (by C-G formula based on SCr of 1.48 mg/dL (H)). Liver Function Tests: Recent Labs  Lab 12/05/20 0424 12/08/20 0755  AST 58* 28  ALT 56* 46*  ALKPHOS 74 91  BILITOT 1.3* 0.7  PROT 6.6 6.2*  ALBUMIN 2.4* 1.9*   No results for input(s): LIPASE, AMYLASE in the last 168 hours. No results for input(s): AMMONIA in the last 168 hours. Coagulation Profile: No results for input(s): INR, PROTIME in the last 168 hours. Cardiac Enzymes: No results for input(s): CKTOTAL, CKMB, CKMBINDEX, TROPONINI in the last 168 hours. BNP (last 3 results) No results for input(s): PROBNP in the last 8760 hours. HbA1C: No results for input(s): HGBA1C in the last 72 hours. CBG: No results for input(s): GLUCAP in the last 168 hours. Lipid Profile: No results for input(s): CHOL, HDL, LDLCALC, TRIG, CHOLHDL, LDLDIRECT in the last 72 hours. Thyroid Function Tests: No results for input(s): TSH, T4TOTAL, FREET4, T3FREE, THYROIDAB in the last 72 hours. Anemia Panel: No results for input(s): VITAMINB12, FOLATE, FERRITIN, TIBC, IRON, RETICCTPCT in the last 72 hours. Sepsis Labs: Recent Labs  Lab 12/07/20 1004  LATICACIDVEN 1.6    Recent Results (from the past 240 hour(s))  SARS Coronavirus 2 by RT PCR (hospital order, performed in [facility]) Nasopharyngeal Nasopharyngeal Swab     Status: None   Collection Time: 12/05/20 12:26 AM   Specimen: Nasopharyngeal Swab   Result Value Ref Range Status   SARS Coronavirus 2 NEGATIVE NEGATIVE Final    Comment: (NOTE) SARS-CoV-2 target nucleic acids are NOT DETECTED.  The SARS-CoV-2 RNA is generally detectable in upper and lower respiratory specimens during the acute phase of infection. The lowest concentration of SARS-CoV-2 viral copies this assay can detect is 250 copies / mL. A negative result does not preclude SARS-CoV-2 infection and should not be used as the sole basis for treatment or other patient management decisions.  A negative result may occur with improper specimen collection / handling, submission of specimen other than nasopharyngeal swab, presence of viral mutation(s) within the areas targeted by this assay, and inadequate number of viral copies (<250 copies / mL). A negative result must be combined with clinical observations, patient history, and epidemiological information.  Fact Sheet for Patients:   BoilerBrush.com.cyhttps://www.fda.gov/media/136312/download  Fact Sheet for Healthcare Providers: https://pope.com/https://www.fda.gov/media/136313/download  This test is not yet approved or  cleared by the [place] FDA and has been authorized for detection and/or diagnosis of SARS-CoV-2 by FDA under an Emergency Use Authorization (EUA).  This EUA will remain in effect (meaning this test can be used) for the duration of the COVID-19 declaration under Section 564(b)(1) of the Act, 21 U.S.C. section 360bbb-3(b)(1), unless the authorization is terminated or revoked sooner.  Performed at [facility], [address] [place], [place] [address]   Culture, Urine     Status: Abnormal   Collection Time: 12/06/20  9:59 AM   Specimen: Urine, Clean Catch  Result Value Ref Range Status   Specimen Description URINE, CLEAN CATCH  Final   Special Requests   Final    NONE Performed at [facility], [address] [place], [place] [address]    Culture (A)  Final    >=100,000 COLONIES/mL PROTEUS  MIRABILIS >=100,000 COLONIES/mL ESCHERICHIA COLI    Report Status 12/08/2020 FINAL  Final   Organism ID, Bacteria PROTEUS MIRABILIS (A)  Final   Organism ID,  Bacteria ESCHERICHIA COLI (A)  Final      Susceptibility   Escherichia coli - MIC*    AMPICILLIN 4 SENSITIVE Sensitive     CEFAZOLIN <=4 SENSITIVE Sensitive     CEFEPIME <=0.12 SENSITIVE Sensitive     CEFTRIAXONE <=0.25 SENSITIVE Sensitive     CIPROFLOXACIN <=0.25 SENSITIVE Sensitive     GENTAMICIN <=1 SENSITIVE Sensitive     IMIPENEM <=0.25 SENSITIVE Sensitive     NITROFURANTOIN <=16 SENSITIVE Sensitive     TRIMETH/SULFA <=20 SENSITIVE Sensitive     AMPICILLIN/SULBACTAM <=2 SENSITIVE Sensitive     PIP/TAZO <=4 SENSITIVE Sensitive     * >=100,000 COLONIES/mL ESCHERICHIA COLI   Proteus mirabilis - MIC*    AMPICILLIN <=2 SENSITIVE Sensitive     CEFAZOLIN <=4 SENSITIVE Sensitive     CEFEPIME <=0.12 SENSITIVE Sensitive     CEFTRIAXONE <=0.25 SENSITIVE Sensitive     CIPROFLOXACIN <=0.25 SENSITIVE Sensitive     GENTAMICIN <=1 SENSITIVE Sensitive     IMIPENEM 1 SENSITIVE Sensitive     NITROFURANTOIN 128 RESISTANT Resistant     TRIMETH/SULFA <=20 SENSITIVE Sensitive     AMPICILLIN/SULBACTAM <=2 SENSITIVE Sensitive     PIP/TAZO <=4 SENSITIVE Sensitive     * >=100,000 COLONIES/mL PROTEUS MIRABILIS  Culture, Urine     Status: Abnormal   Collection Time: 12/06/20  4:06 PM   Specimen: Urine, Random  Result Value Ref Range Status   Specimen Description URINE, RANDOM  Final   Special Requests   Final    NONE Performed at [facility], [address] [place], [place] [address]    Culture >=100,000 COLONIES/mL ESCHERICHIA COLI (A)  Final   Report Status 12/08/2020 FINAL  Final   Organism ID, Bacteria ESCHERICHIA COLI (A)  Final      Susceptibility   Escherichia coli - MIC*    AMPICILLIN 8 SENSITIVE Sensitive     CEFAZOLIN <=4 SENSITIVE Sensitive     CEFEPIME <=0.12 SENSITIVE Sensitive     CEFTRIAXONE <=0.25 SENSITIVE  Sensitive     CIPROFLOXACIN <=0.25 SENSITIVE Sensitive     GENTAMICIN <=1 SENSITIVE Sensitive     IMIPENEM <=0.25 SENSITIVE Sensitive     NITROFURANTOIN 32 SENSITIVE Sensitive     TRIMETH/SULFA <=20 SENSITIVE Sensitive     AMPICILLIN/SULBACTAM 4 SENSITIVE Sensitive     PIP/TAZO <=4 SENSITIVE Sensitive     * >=100,000 COLONIES/mL ESCHERICHIA COLI  Culture, blood (single)     Status: None (Preliminary result)   Collection Time: 12/07/20 10:04 AM   Specimen: BLOOD RIGHT ARM  Result Value Ref Range Status   Specimen Description BLOOD RIGHT ARM  Final   Special Requests   Final    BOTTLES DRAWN AEROBIC ONLY Blood Culture results may not be optimal due to an inadequate volume of blood received in culture bottles   Culture   Final    NO GROWTH 4 DAYS Performed at [facility], [address], [place], [place] [address]    Report Status PENDING  Incomplete         Radiology Studies: CT LUMBAR SPINE WO CONTRAST  Result Date: 12/11/2020 CLINICAL DATA:  70 year old female with persistent low back pain. UTI, acute renal injury. Lumbar spondylolisthesis as L3 pars fracture on recent radiographs. EXAM: CT LUMBAR SPINE WITHOUT CONTRAST TECHNIQUE: Multidetector CT imaging of the lumbar spine was performed without intravenous contrast administration. Multiplanar CT image reconstructions were also generated. COMPARISON:  Lumbar radiographs 12/05/2020. FINDINGS: Segmentation: Lumbar segmentation appears to  be normal and will be designated as such for this report. Alignment: Preserved lumbar lordosis with multilevel mild spondylolisthesis. Grade 1 anterolisthesis of L4 on L5 measures 4-5 mm. 2-3 mm of anterolisthesis of L3 on L4. Mild retrolisthesis of L1 on L2. Vertebrae: Visible lower thoracic vertebrae and ribs appear intact. Widespread degenerative sclerosis of the lumbar posterior elements, see details below. Despite the multilevel spondylolisthesis. There is no lumbar pars defect.- No acute  osseous abnormality identified. Visible sacrum and SI joints appear intact. Paraspinal and other soft tissues: Mild respiratory motion at the lung bases. Small gastric hiatal hernia suspected. Aortoiliac calcified atherosclerosis. Right side nephrolithiasis (series 5 image 53) is associated with asymmetric enlargement of the right kidney and possible right hydronephrosis. Furthermore there appears to be asymmetric right periureteral stranding and asymmetric right hydroureter continuing into the pelvis (series 5, image 140). Distal ureter and bladder not included. The contralateral left kidney and ureter appear comparatively normal. Negative visible other abdominal and pelvic viscera. Lumbar paraspinal soft tissues are within normal limits. Disc levels: Intermittent lower thoracic interbody ankylosis due to flowing endplate osteophytes (sagittal image 30) including at T11-T12. T12-L1: Mild to moderate facet hypertrophy greater on the right. Mild disc bulge and endplate spurring. No significant stenosis. L1-L2: Mild to moderate facet and ligament flavum hypertrophy. Mild disc bulge and endplate spurring. Mild spinal stenosis and up to moderate lateral recess and foraminal stenosis suspected. L2-L3: Mild disc bulge. Moderate to severe facet and ligament flavum hypertrophy. Mild if any stenosis. L3-L4: Anterolisthesis with severe facet arthropathy including bilateral vacuum facet. Disc bulge with posterior element hypertrophy with combined moderate to severe spinal stenosis. Mild to moderate L3 foraminal stenosis. L4-L5: Anterolisthesis with circumferential disc bulge and severe facet arthropathy. Bilateral vacuum facet. Moderate to severe posterior element hypertrophy. Moderate to severe spinal and left lateral recess stenosis. At least moderate L4 foraminal stenosis. L5-S1: Severe facet hypertrophy greater on the right, but with evidence of solid bilateral facet ankylosis. Circumferential disc bulge and endplate  spurring. No spinal stenosis, but mild to moderate neural foraminal stenosis suspected and greater on the left. IMPRESSION: 1. Appearance suspicious for acute obstructive uropathy on the right, with superimposed right nephrolithiasis. Recommend follow-up noncontrast CT Abdomen and Pelvis for confirmation. 2.  No acute osseous abnormality in the lumbar spine. Multilevel lumbar spondylolisthesis with severe facet arthropathy L3-L4 through L5-S1. Superimposed Ankylosed facets at L5-S1. And lower thoracic interbody ankylosis. Multifactorial moderate to severe spinal and/or lateral recess stenosis at L3-L4 and L4-L5. Mild degenerative spinal stenosis suspected at L1-L2 and L2-L3. 3. Aortic Atherosclerosis (ICD10-I70.0). Electronically Signed   By: Odessa Fleming M.D.   On: 12/11/2020 10:45        Scheduled Meds: . aspirin EC  81 mg Oral Daily  . cephALEXin  500 mg Oral Q8H  . cyclobenzaprine  5 mg Oral TID  . ezetimibe  10 mg Oral Daily  . levothyroxine  100 mcg Oral Q0600  . lidocaine  1 patch Transdermal Q24H   Continuous Infusions:    LOS: 6 days    Time spent: 30 minutes    Dorcas Carrow, MD [facility] Pager [phone]

## 2020-12-11 NOTE — Progress Notes (Signed)
Upon rounding, patient request to sleep a little longer. Will accommodate request.

## 2020-12-11 NOTE — Progress Notes (Signed)
Able to weight patient in bed at this time (134.7kg). passive and active ROM performed. Patient is going to CT.

## 2020-12-11 NOTE — Progress Notes (Signed)
RN observed skin tear on patient Right hip resulting from prolonged sitting and pressure from the recliner. Educated to be put back in the bed but unsuccessful. Pt stated "I feel more comfortable in the recliner. I have been in this chair since Thursday". Skin area covered with foam dressing and recliner changed to bariatric one. Will continue to monitor.

## 2020-12-11 NOTE — TOC Progression Note (Signed)
Transition of Care [facility]) - Progression Note    Patient Details  Name: Cassidy Austin MRN: [id number] Date of Birth: Jun 12, 1951  [facility]) CM/SW Contact  Jimmy Picket, [place] Phone Number: 12/11/2020, 4:17 PM  Clinical Narrative:     CSW spoke with pt about bed offers. Pt chose [facility]. CSW reached out to [facility] about pt DCing tomorrow, admissions coordinator has not yet responded. CSW requested covid test from MD.   Expected Discharge Plan: Skilled Nursing Facility Barriers to Discharge: Insurance Authorization,Continued Medical Work up  Expected Discharge Plan and Services Expected Discharge Plan: Skilled Nursing Facility       Living arrangements for the past 2 months: Single Family Home                                       Social Determinants of Health (SDOH) Interventions    Readmission Risk Interventions No flowsheet data found.  Jimmy Picket, Theresia Majors, [place] Clinical Social Worker [phone]

## 2020-12-12 LAB — SARS CORONAVIRUS 2 (TAT 6-24 HRS): SARS Coronavirus 2: NEGATIVE

## 2020-12-12 LAB — CBC WITH DIFFERENTIAL/PLATELET
Abs Immature Granulocytes: 0.68 10*3/uL — ABNORMAL HIGH (ref 0.00–0.07)
Basophils Absolute: 0.1 10*3/uL (ref 0.0–0.1)
Basophils Relative: 0 %
Eosinophils Absolute: 0.1 10*3/uL (ref 0.0–0.5)
Eosinophils Relative: 1 %
HCT: 25.3 % — ABNORMAL LOW (ref 36.0–46.0)
Hemoglobin: 8.9 g/dL — ABNORMAL LOW (ref 12.0–15.0)
Immature Granulocytes: 4 %
Lymphocytes Relative: 7 %
Lymphs Abs: 1.3 10*3/uL (ref 0.7–4.0)
MCH: 29.5 pg (ref 26.0–34.0)
MCHC: 35.2 g/dL (ref 30.0–36.0)
MCV: 83.8 fL (ref 80.0–100.0)
Monocytes Absolute: 1 10*3/uL (ref 0.1–1.0)
Monocytes Relative: 5 %
Neutro Abs: 16.5 10*3/uL — ABNORMAL HIGH (ref 1.7–7.7)
Neutrophils Relative %: 83 %
Platelets: 327 10*3/uL (ref 150–400)
RBC: 3.02 MIL/uL — ABNORMAL LOW (ref 3.87–5.11)
RDW: 15.8 % — ABNORMAL HIGH (ref 11.5–15.5)
WBC: 19.6 10*3/uL — ABNORMAL HIGH (ref 4.0–10.5)
nRBC: 0 % (ref 0.0–0.2)

## 2020-12-12 LAB — CULTURE, BLOOD (SINGLE): Culture: NO GROWTH

## 2020-12-12 MED ORDER — TAMSULOSIN HCL 0.4 MG PO CAPS
0.4000 mg | ORAL_CAPSULE | Freq: Every day | ORAL | Status: DC
  Administered 2020-12-12 – 2020-12-13 (×2): 0.4 mg via ORAL
  Filled 2020-12-12 (×2): qty 1

## 2020-12-12 MED ORDER — POLYETHYLENE GLYCOL 3350 17 G PO PACK
17.0000 g | PACK | Freq: Every day | ORAL | Status: DC
  Administered 2020-12-12 – 2020-12-13 (×2): 17 g via ORAL
  Filled 2020-12-12 (×2): qty 1

## 2020-12-12 NOTE — TOC Progression Note (Signed)
Transition of Care [facility]) - Progression Note    Patient Details  Name: Cassidy Austin MRN: [id number] Date of Birth: [birth year]  Transition [facility]) CM/SW Contact  Cassidy Austin, [place] Phone Number: 12/12/2020, 3:01 PM  Clinical Narrative:     Pt has been accepted at [facility] and they can take her on 12/13/20. Covid test complete on 12/11/20, no need to repeat at this time.   Expected Discharge Plan: Skilled Nursing Facility Barriers to Discharge: Insurance Authorization,Continued Medical Work up  Expected Discharge Plan and Services Expected Discharge Plan: Skilled Nursing Facility       Living arrangements for the past 2 months: Single Family Home                                       Social Determinants of Health (SDOH) Interventions    Readmission Risk Interventions No flowsheet data found.  Cassidy Austin, Cassidy Austin, [place] Clinical Social Worker [phone]

## 2020-12-12 NOTE — Progress Notes (Signed)
Physical Therapy Treatment Patient Details Name: Cassidy Austin MRN: [id number] DOB: 02/19/1951 Today's Date: 12/12/2020    History of Present Illness Pt is a 70 y/o female admitted secondary to hematuria, fatigue/weakness, and chest pain, over 1-2 days. Found to have sepsis secondary to UTI. PMH includes HTN.    PT Comments    Pt received in chair, cooperative and pleasant. Pt was unaware that the back of her legs and shoes and the floor were soaked in her urine. PT assistance to clean her legs, shoes, and chair. Pt was also found with blood on her gown and peripheral IV pulled out. Pt was unaware that the IV was out, but stated that she thought she "felt something wet". IV area was stabilized and RN notified immediately. Pt was minAx2 for sit to stands from chair to stedy. Pt was able to complete 5 sit-to-stands from elevated surface of stedy to standing with minAx2 and only one hand to pull herself up. Also completed standing for 2 times for 2 minutes in stedy with both hands on stedy and minAx1 and standing with reaching 10 times in the stedy. Pt fatigues easily but willing to participate in PT. Pt left in chair with call bell within reach, all needs met, and nephew present in room.    Follow Up Recommendations  SNF;Supervision/Assistance - 24 hour     Equipment Recommendations  Wheelchair (measurements PT);Wheelchair cushion (measurements PT)    Recommendations for Other Services       Precautions / Restrictions Precautions Precautions: Fall Restrictions Weight Bearing Restrictions: No    Mobility  Bed Mobility               General bed mobility comments: Pt received sitting in chair  Transfers Overall transfer level: Needs assistance   Transfers: Sit to/from Stand Sit to Stand: +2 physical assistance;Min assist         General transfer comment: Min Ax2 for sit to stand from recliner using Stedy to pull up on. x5 STS from Stedy to standing with one hand used to  pull and min A x2  Ambulation/Gait                 Stairs             Wheelchair Mobility    Modified Rankin (Stroke Patients Only)       Balance Overall balance assessment: Needs assistance Sitting-balance support: Bilateral upper extremity supported Sitting balance-Leahy Scale: Poor Sitting balance - Comments: Bracing with BUE   Standing balance support: Bilateral upper extremity supported Standing balance-Leahy Scale: Poor                              Cognition Arousal/Alertness: Awake/alert Behavior During Therapy: WFL for tasks assessed/performed Overall Cognitive Status: Within Functional Limits for tasks assessed                                        Exercises Other Exercises Other Exercises: STS from stedy, x5 with min Ax2 Other Exercises: Standing in the stedy with one hand on stedy, reaching hand to touch targets (PT's hand) x 10 Other Exercises: Standing in stedy 2 x 2 minutes with min Ax1    General Comments General comments (skin integrity, edema, etc.): VSS on RA      Pertinent Vitals/Pain Pain Assessment: Faces Faces Pain Scale:   Hurts even more Pain Location: low back Pain Descriptors / Indicators: Shooting;Sharp Pain Intervention(s): Monitored during session    Home Living                      Prior Function            PT Goals (current goals can now be found in the care plan section) Acute Rehab PT Goals Patient Stated Goal: to decrease pain PT Goal Formulation: With patient Time For Goal Achievement: 12/20/20 Potential to Achieve Goals: Good Progress towards PT goals: Progressing toward goals    Frequency    Min 2X/week      PT Plan Current plan remains appropriate    Co-evaluation              AM-PAC PT "6 Clicks" Mobility   Outcome Measure  Help needed turning from your back to your side while in a flat bed without using bedrails?: A Lot Help needed moving from  lying on your back to sitting on the side of a flat bed without using bedrails?: A Lot Help needed moving to and from a bed to a chair (including a wheelchair)?: A Lot Help needed standing up from a chair using your arms (e.g., wheelchair or bedside chair)?: A Lot Help needed to walk in hospital room?: Total Help needed climbing 3-5 steps with a railing? : Total 6 Click Score: 10    End of Session Equipment Utilized During Treatment: Gait belt Activity Tolerance: Patient tolerated treatment well Patient left: in chair;with call bell/phone within reach;with family/visitor present Nurse Communication: Mobility status PT Visit Diagnosis: Unsteadiness on feet (R26.81);Muscle weakness (generalized) (M62.81);Difficulty in walking, not elsewhere classified (R26.2);Pain Pain - part of body:  (back)     Time:  -     Charges:                        Rosita Kea, SPT

## 2020-12-12 NOTE — Progress Notes (Signed)
Dr. Jerral Ralph aware that patient no longer has IV access until we hear if she will be having any additional tests or procedures.

## 2020-12-12 NOTE — Consult Note (Signed)
I have been asked to see the patient by Dr. Dorcas Carrow, for evaluation and management of right distal ureteral stone.  History of present illness: 6F who presented to the ED with weakness and back pain.  She had been moving apartments on her own and started to feel weak and w shortness of breath.  She was noted to have acute renal failure with an elevated WBC.  She had a urine culture return with both proteus and e.coli.  Renal U/S showed no hydronephrosis.  She was admitted for pyelonephritis and dehydration.  While in the hospital she was complaining of low back pain and her WBC was increasing.  A CT scan was then done and showed a stone at the UVJ of her right ureter.  She had moderate hydronephrosis.  The patient is complaining of low midline back pain, left > right.  She is afebrile and her renal function has improved.  Her blood pressure is stable.  Review of systems: A 12 point comprehensive review of systems was obtained and is negative unless otherwise stated in the history of present illness.  Patient Active Problem List   Diagnosis Date Noted  . Pressure injury of skin 12/11/2020  . Chest pain 12/05/2020  . Renal insufficiency 12/05/2020  . Normocytic anemia 12/05/2020  . Thrombocytopenia (HCC) 12/05/2020  . Leukocytosis 12/05/2020  . Acute renal insufficiency   . Low back pain   . Hypothyroidism   . Hypertension   . Complication of anesthesia   . OA (osteoarthritis) of knee   . Hypercholesterolemia   . Goiter     No current facility-administered medications on file prior to encounter.   Current Outpatient Medications on File Prior to Encounter  Medication Sig Dispense Refill  . aspirin 81 MG chewable tablet Chew 81 mg by mouth every morning.    . carvedilol (COREG) 12.5 MG tablet Take 12.5 mg by mouth 2 (two) times daily.    . cholecalciferol (VITAMIN D) 1000 UNITS tablet Take 1,000 Units by mouth 2 (two) times daily.    Marland Kitchen ezetimibe (ZETIA) 10 MG tablet Take 10 mg  by mouth daily.    . furosemide (LASIX) 40 MG tablet Take 40 mg by mouth daily.    Marland Kitchen KLOR-CON M20 20 MEQ tablet Take 20 mEq by mouth daily.  5  . levothyroxine (SYNTHROID, LEVOTHROID) 100 MCG tablet Take 100 mcg by mouth daily at 6 (six) AM.    . Magnesium 250 MG TABS Take 500 mg by mouth every morning.    . olmesartan (BENICAR) 40 MG tablet Take 40 mg by mouth daily.    Marland Kitchen tetrahydrozoline 0.05 % ophthalmic solution Apply 1 drop to eye as needed. Visine for dry eyes    . TURMERIC PO Take 2 tablets by mouth daily.    . vitamin B-12 (CYANOCOBALAMIN) 1000 MCG tablet Take 2,000 mcg by mouth every morning.    . vitamin C (ASCORBIC ACID) 500 MG tablet Take 500 mg by mouth daily.      Past Medical History:  Diagnosis Date  . Complication of anesthesia    hard to wake up after eye surgery in 1970s  . Goiter    with thyroid nodule, FNA 12/15 benign follicular nodule  . Hypercholesterolemia   . Hypertension   . Hypothyroidism   . OA (osteoarthritis) of knee    mod.    Past Surgical History:  Procedure Laterality Date  . COLONOSCOPY    . EYE SURGERY    . THYROID SURGERY  1999    Social History   Tobacco Use  . Smoking status: Never Smoker  . Smokeless tobacco: Never Used  Vaping Use  . Vaping Use: Never used  Substance Use Topics  . Alcohol use: No  . Drug use: No    Family History  Problem Relation Age of Onset  . Heart failure Mother   . Breast cancer Neg Hx     PE: Vitals:   12/11/20 2336 12/12/20 0523 12/12/20 1207 12/12/20 1730  BP: (!) 116/53 (!) 134/55 (!) 126/54 (!) 133/53  Pulse: 78 72 64 76  Resp: 19 18 18 18   Temp: 98 F (36.7 C) 97.9 F (36.6 C) 98.8 F (37.1 C) 98.7 F (37.1 C)  TempSrc: Oral Oral Oral Oral  SpO2: 97% 100% 92% 99%  Weight:      Height:       Patient appears to be in no acute distress  patient is alert and oriented x3 Atraumatic normocephalic head No cervical or supraclavicular lymphadenopathy appreciated No increased work of  breathing, no audible wheezes/rhonchi Regular sinus rhythm/rate Abdomen is soft, nontender, nondistended, no right sided CVA tenderness Lower extremities are symmetric without appreciable edema Grossly neurologically intact No identifiable skin lesions  Recent Labs    12/10/20 0053 12/11/20 0427 12/12/20 0332  WBC 18.2* 18.6* 19.6*  HGB 9.5* 8.9* 8.9*  HCT 28.0* 26.9* 25.3*   Recent Labs    12/10/20 0053 12/11/20 0427  NA 139 139  K 4.9 5.0  CL 107 105  CO2 23 25  GLUCOSE 106* 94  BUN 46* 32*  CREATININE 1.53* 1.48*  CALCIUM 8.0* 8.2*   No results for input(s): LABPT, INR in the last 72 hours. No results for input(s): LABURIN in the last 72 hours. Results for orders placed or performed during the hospital encounter of 12/04/20  SARS Coronavirus 2 by RT PCR (hospital order, performed in [facility] hospital lab) Nasopharyngeal Nasopharyngeal Swab     Status: None   Collection Time: 12/05/20 12:26 AM   Specimen: Nasopharyngeal Swab  Result Value Ref Range Status   SARS Coronavirus 2 NEGATIVE NEGATIVE Final    Comment: (NOTE) SARS-CoV-2 target nucleic acids are NOT DETECTED.  The SARS-CoV-2 RNA is generally detectable in upper and lower respiratory specimens during the acute phase of infection. The lowest concentration of SARS-CoV-2 viral copies this assay can detect is 250 copies / mL. A negative result does not preclude SARS-CoV-2 infection and should not be used as the sole basis for treatment or other patient management decisions.  A negative result may occur with improper specimen collection / handling, submission of specimen other than nasopharyngeal swab, presence of viral mutation(s) within the areas targeted by this assay, and inadequate number of viral copies (<250 copies / mL). A negative result must be combined with clinical observations, patient history, and epidemiological information.  Fact Sheet for Patients:    BoilerBrush.com.cy  Fact Sheet for Healthcare Providers: https://pope.com/  This test is not yet approved or  cleared by the [place] FDA and has been authorized for detection and/or diagnosis of SARS-CoV-2 by FDA under an Emergency Use Authorization (EUA).  This EUA will remain in effect (meaning this test can be used) for the duration of the COVID-19 declaration under Section 564(b)(1) of the Act, 21 U.S.C. section 360bbb-3(b)(1), unless the authorization is terminated or revoked sooner.  Performed at [facility], [address], [place], [place] [address]   Culture, Urine     Status:  Abnormal   Collection Time: 12/06/20  9:59 AM   Specimen: Urine, Clean Catch  Result Value Ref Range Status   Specimen Description URINE, CLEAN CATCH  Final   Special Requests   Final    NONE Performed at [facility], [address] [place], [place] [address]    Culture (A)  Final    >=100,000 COLONIES/mL PROTEUS MIRABILIS >=100,000 COLONIES/mL ESCHERICHIA COLI    Report Status 12/08/2020 FINAL  Final   Organism ID, Bacteria PROTEUS MIRABILIS (A)  Final   Organism ID, Bacteria ESCHERICHIA COLI (A)  Final      Susceptibility   Escherichia coli - MIC*    AMPICILLIN 4 SENSITIVE Sensitive     CEFAZOLIN <=4 SENSITIVE Sensitive     CEFEPIME <=0.12 SENSITIVE Sensitive     CEFTRIAXONE <=0.25 SENSITIVE Sensitive     CIPROFLOXACIN <=0.25 SENSITIVE Sensitive     GENTAMICIN <=1 SENSITIVE Sensitive     IMIPENEM <=0.25 SENSITIVE Sensitive     NITROFURANTOIN <=16 SENSITIVE Sensitive     TRIMETH/SULFA <=20 SENSITIVE Sensitive     AMPICILLIN/SULBACTAM <=2 SENSITIVE Sensitive     PIP/TAZO <=4 SENSITIVE Sensitive     * >=100,000 COLONIES/mL ESCHERICHIA COLI   Proteus mirabilis - MIC*    AMPICILLIN <=2 SENSITIVE Sensitive     CEFAZOLIN <=4 SENSITIVE Sensitive     CEFEPIME <=0.12 SENSITIVE Sensitive     CEFTRIAXONE <=0.25 SENSITIVE  Sensitive     CIPROFLOXACIN <=0.25 SENSITIVE Sensitive     GENTAMICIN <=1 SENSITIVE Sensitive     IMIPENEM 1 SENSITIVE Sensitive     NITROFURANTOIN 128 RESISTANT Resistant     TRIMETH/SULFA <=20 SENSITIVE Sensitive     AMPICILLIN/SULBACTAM <=2 SENSITIVE Sensitive     PIP/TAZO <=4 SENSITIVE Sensitive     * >=100,000 COLONIES/mL PROTEUS MIRABILIS  Culture, Urine     Status: Abnormal   Collection Time: 12/06/20  4:06 PM   Specimen: Urine, Random  Result Value Ref Range Status   Specimen Description URINE, RANDOM  Final   Special Requests   Final    NONE Performed at [facility], [address], [place], [place] [address]    Culture >=100,000 COLONIES/mL ESCHERICHIA COLI (A)  Final   Report Status 12/08/2020 FINAL  Final   Organism ID, Bacteria ESCHERICHIA COLI (A)  Final      Susceptibility   Escherichia coli - MIC*    AMPICILLIN 8 SENSITIVE Sensitive     CEFAZOLIN <=4 SENSITIVE Sensitive     CEFEPIME <=0.12 SENSITIVE Sensitive     CEFTRIAXONE <=0.25 SENSITIVE Sensitive     CIPROFLOXACIN <=0.25 SENSITIVE Sensitive     GENTAMICIN <=1 SENSITIVE Sensitive     IMIPENEM <=0.25 SENSITIVE Sensitive     NITROFURANTOIN 32 SENSITIVE Sensitive     TRIMETH/SULFA <=20 SENSITIVE Sensitive     AMPICILLIN/SULBACTAM 4 SENSITIVE Sensitive     PIP/TAZO <=4 SENSITIVE Sensitive     * >=100,000 COLONIES/mL ESCHERICHIA COLI  Culture, blood (single)     Status: None   Collection Time: 12/07/20 10:04 AM   Specimen: BLOOD RIGHT ARM  Result Value Ref Range Status   Specimen Description BLOOD RIGHT ARM  Final   Special Requests   Final    BOTTLES DRAWN AEROBIC ONLY Blood Culture results may not be optimal due to an inadequate volume of blood received in culture bottles   Culture   Final    NO GROWTH 5 DAYS Performed at [facility], [address] [place], [place] [address]    Report Status 12/12/2020 FINAL  Final  SARS CORONAVIRUS 2 (TAT 6-24 HRS) Nasopharyngeal Nasopharyngeal Swab      Status: None   Collection Time: 12/11/20  4:12 PM   Specimen: Nasopharyngeal Swab  Result Value Ref Range Status   SARS Coronavirus 2 NEGATIVE NEGATIVE Final    Comment: (NOTE) SARS-CoV-2 target nucleic acids are NOT DETECTED.  The SARS-CoV-2 RNA is generally detectable in upper and lower respiratory specimens during the acute phase of infection. Negative results do not preclude SARS-CoV-2 infection, do not rule out co-infections with other pathogens, and should not be used as the sole basis for treatment or other patient management decisions. Negative results must be combined with clinical observations, patient history, and epidemiological information. The expected result is Negative.  Fact Sheet for Patients: HairSlick.no  Fact Sheet for Healthcare Providers: quierodirigir.com  This test is not yet approved or cleared by the [place] FDA and  has been authorized for detection and/or diagnosis of SARS-CoV-2 by FDA under an Emergency Use Authorization (EUA). This EUA will remain  in effect (meaning this test can be used) for the duration of the COVID-19 declaration under Se ction 564(b)(1) of the Act, 21 U.S.C. section 360bbb-3(b)(1), unless the authorization is terminated or revoked sooner.  Performed at [facility], [address], [place], [place] [address]     Imaging: I have independently reviewed the CT scan.  This showed a 2-4mm stone in the UVJ of the right ureter, with mild hydro.  Imp:  The patient has a small stone just at the ureteral orifice on the CT scan and is otherwise asymptomatic.  It is not clear to me when this stone dropped into the collecting system since she did not have hydronephrosis on the original U/S upon admission.    Recommendations: Treat expectantly and monitor for signs of worsening including pain, fevers, hypotension.  I will follow-up with her in the office and repeat her  ultrasound to ensure that her hydro resolves.  She should continue abx for a total of 2 weeks.  Thank you for involving me in this patient's care, please page with any further questions or concerns. Crist Fat

## 2020-12-12 NOTE — Progress Notes (Signed)
PROGRESS NOTE    Chanci Ojala  ZOX:[id number] DOB: [birth year] DOA: 12/04/2020 PCP: Laurann Montana, MD    Brief Narrative:  70 year old female with history of hypertension, venous insufficiency, morbid obesity presented with hematuria, fatigue ,weakness and chest discomfort for 2 days.  She was found to have UTI, minimally elevated troponins, acute kidney injury with recent normal baseline renal functions. Patient recently doing manual work at home and significant back pain. Patient was found to have E. coli bacteremia, E. coli UTI.  Clinically improved.  CT scan showed right VUJ stone.  Clinically improving other than persistent leukocytosis.    Assessment & Plan:   Principal Problem:   Chest pain Active Problems:   Hypothyroidism   Hypertension   Hypercholesterolemia   Renal insufficiency   Normocytic anemia   Thrombocytopenia (HCC)   Leukocytosis   Pressure injury of skin  Acute kidney injury: Lab Results  Component Value Date   CREATININE 1.48 (H) 12/11/2020   CREATININE 1.53 (H) 12/10/2020   CREATININE 2.04 (H) 12/09/2020   Her creatinine is gradually improving and normalizing.  Urine output is adequate.  This is probably prerenal with dehydration, ongoing use of Lasix and olmesartan. Initial renal ultrasound was without evidence of hydronephrosis or obstruction. Recent outpatient kidney function test on 07/21 was normal. Keeping of hydration by mouth.  Off IV fluids for now. A CT scan of the lumbar spine was done due to persistent back pain that showed right-sided hydronephrosis. Renal ultrasound 2/1 did not show any hydronephrosis. Patient continues to have high white count, a dedicated abdomen pelvis CT scan showed mild hydronephrosis on the right side, 5 mm nonobstructive ureteric calculi, 3 mm right VUJ calculi that is probably obstructive. Due to persistent leukocytosis, discussed with Dr. Marlou Porch from urology.  He will evaluate the patient for possible  cystoscopy and stone extraction.  Severe sepsis due to E. coli UTI: Present on admission.  Resolved.  Blood cultures negative.  Urine culture with pansensitive E. coli.  Treated with Rocephin, changed to oral cephalosporin for total 10 days.    Atypical chest pain with demand ischemia: Evaluated by cardiology.  Echocardiogram was normal.  If recurrent chest pain suggested nuclear stress test.  Currently no indication for treatment.  Essential hypertension: Currently on carvedilol.  Blood pressures normal or low normal.  Transaminitis: Mild elevated transaminases probably due to transient shock liver.  Improved and normalized.  Hypothyroidism: Clinically euthyroid on current dose of thyroxine.  Debility/deconditioning: Work with PT OT.  She will benefit with short-term rehab.  Refer to SNF.  Back pain: Localized back pain after manual work at home.  No evidence of complication.  Will use local lidocaine patch and oxycodone for pain relief, avoid NSAIDs.  Continue to work with PT OT. Persistent pain, CT scan with chronic degenerative changes.  No acute disease or vertebral fracture.   DVT prophylaxis: SCDs Start: 12/05/20 0118   Code Status: Full code Family Communication: None.  Patient is communicating. Disposition Plan: Status is: Inpatient  Remains inpatient appropriate because:Inpatient level of care appropriate due to severity of illness   Dispo: The patient is from: Home              Anticipated d/c is to: SNF              Anticipated d/c date is: 1-2 days               Patient currently is not medically stable.  Wait for Urology recs  Difficult to place patient No    Consultants:   Urology  Procedures:   None   Antimicrobials:  Anti-infectives (From admission, onward)   Start     Dose/Rate Route Frequency Ordered Stop   12/08/20 0930  cephALEXin (KEFLEX) capsule 500 mg        500 mg Oral Every 8 hours 12/08/20 0833 12/13/20 0559   12/06/20 1230  cefTRIAXone  (ROCEPHIN) 1 g in sodium chloride 0.9 % 100 mL IVPB  Status:  Discontinued        1 g 200 mL/hr over 30 Minutes Intravenous Every 24 hours 12/06/20 1117 12/08/20 0833         Subjective: Patient seen and examined.  No overnight events.  Her back pain is somehow relieved with oxycodone Flexeril and lidocaine patch.  Denies any difficulty with urinating.  She has a pure wick. WBC count elevated, renal functions improving. I called and discussed her condition with urology, Dr. Marlou Porch who will see her in afternoon and decide whether patient will need cystoscopy and stone extraction.  Objective: Vitals:   12/11/20 1222 12/11/20 1844 12/11/20 2336 12/12/20 0523  BP: 130/61 (!) 130/52 (!) 116/53 (!) 134/55  Pulse: 81 91 78 72  Resp: 16 16 19 18   Temp: 97.8 F (36.6 C) 99.6 F (37.6 C) 98 F (36.7 C) 97.9 F (36.6 C)  TempSrc: Oral Oral Oral Oral  SpO2: 95% 95% 97% 100%  Weight:      Height:        Intake/Output Summary (Last 24 hours) at 12/12/2020 1035 Last data filed at 12/12/2020 0800 Gross per 24 hour  Intake 600 ml  Output 800 ml  Net -200 ml   Filed Weights   12/06/20 0637 12/07/20 0558 12/11/20 1003  Weight: 127.3 kg 127.9 kg 134.9 kg    Examination:  General exam: Appears calm and comfortable  Comfortable on room air.  Sitting in couch.  She sleeps in couch at home. Respiratory system: Clear to auscultation. Respiratory effort normal. Cardiovascular system: S1 & S2 heard, RRR. No JVD, murmurs, rubs, gallops or clicks. No pedal edema. Gastrointestinal system: Abdomen is nondistended, soft and nontender. No organomegaly or masses felt. Normal bowel sounds heard.  Obese and pendulous. Central nervous system: Alert and oriented. No focal neurological deficits. Extremities: Symmetric 5 x 5 power. Skin: No rashes, lesions or ulcers She has some paraspinal muscle tenderness.  No palpable deformity or fracture.    Data Reviewed: I have personally reviewed following labs  and imaging studies  CBC: Recent Labs  Lab 12/08/20 0755 12/09/20 0026 12/10/20 0053 12/11/20 0427 12/12/20 0332  WBC 18.5* 19.1* 18.2* 18.6* 19.6*  NEUTROABS 14.6* 15.6* 14.4* 15.4* 16.5*  HGB 8.9* 9.2* 9.5* 8.9* 8.9*  HCT 26.0* 25.7* 28.0* 26.9* 25.3*  MCV 82.0 81.1 83.1 83.8 83.8  PLT 161 182 223 259 327   Basic Metabolic Panel: Recent Labs  Lab 12/07/20 0127 12/08/20 0755 12/09/20 0026 12/10/20 0053 12/11/20 0427  NA 136 136 135 139 139  K 3.5 3.3* 4.1 4.9 5.0  CL 103 105 104 107 105  CO2 22 22 20* 23 25  GLUCOSE 109* 122* 134* 106* 94  BUN 83* 79* 68* 46* 32*  CREATININE 2.44* 2.38* 2.04* 1.53* 1.48*  CALCIUM 7.3* 7.7* 7.7* 8.0* 8.2*  MG  --  2.7*  --   --   --    GFR: Estimated Creatinine Clearance: 49.3 mL/min (A) (by C-G formula based on SCr of 1.48 mg/dL (H)).  Liver Function Tests: Recent Labs  Lab 12/08/20 0755  AST 28  ALT 46*  ALKPHOS 91  BILITOT 0.7  PROT 6.2*  ALBUMIN 1.9*   No results for input(s): LIPASE, AMYLASE in the last 168 hours. No results for input(s): AMMONIA in the last 168 hours. Coagulation Profile: No results for input(s): INR, PROTIME in the last 168 hours. Cardiac Enzymes: No results for input(s): CKTOTAL, CKMB, CKMBINDEX, TROPONINI in the last 168 hours. BNP (last 3 results) No results for input(s): PROBNP in the last 8760 hours. HbA1C: No results for input(s): HGBA1C in the last 72 hours. CBG: No results for input(s): GLUCAP in the last 168 hours. Lipid Profile: No results for input(s): CHOL, HDL, LDLCALC, TRIG, CHOLHDL, LDLDIRECT in the last 72 hours. Thyroid Function Tests: No results for input(s): TSH, T4TOTAL, FREET4, T3FREE, THYROIDAB in the last 72 hours. Anemia Panel: No results for input(s): VITAMINB12, FOLATE, FERRITIN, TIBC, IRON, RETICCTPCT in the last 72 hours. Sepsis Labs: Recent Labs  Lab 12/07/20 1004  LATICACIDVEN 1.6    Recent Results (from the past 240 hour(s))  SARS Coronavirus 2 by RT PCR  (hospital order, performed in [facility] hospital lab) Nasopharyngeal Nasopharyngeal Swab     Status: None   Collection Time: 12/05/20 12:26 AM   Specimen: Nasopharyngeal Swab  Result Value Ref Range Status   SARS Coronavirus 2 NEGATIVE NEGATIVE Final    Comment: (NOTE) SARS-CoV-2 target nucleic acids are NOT DETECTED.  The SARS-CoV-2 RNA is generally detectable in upper and lower respiratory specimens during the acute phase of infection. The lowest concentration of SARS-CoV-2 viral copies this assay can detect is 250 copies / mL. A negative result does not preclude SARS-CoV-2 infection and should not be used as the sole basis for treatment or other patient management decisions.  A negative result may occur with improper specimen collection / handling, submission of specimen other than nasopharyngeal swab, presence of viral mutation(s) within the areas targeted by this assay, and inadequate number of viral copies (<250 copies / mL). A negative result must be combined with clinical observations, patient history, and epidemiological information.  Fact Sheet for Patients:   BoilerBrush.com.cy  Fact Sheet for Healthcare Providers: https://pope.com/  This test is not yet approved or  cleared by the [place] FDA and has been authorized for detection and/or diagnosis of SARS-CoV-2 by FDA under an Emergency Use Authorization (EUA).  This EUA will remain in effect (meaning this test can be used) for the duration of the COVID-19 declaration under Section 564(b)(1) of the Act, 21 U.S.C. section 360bbb-3(b)(1), unless the authorization is terminated or revoked sooner.  Performed at [facility], [address] [place], [place] [address]   Culture, Urine     Status: Abnormal   Collection Time: 12/06/20  9:59 AM   Specimen: Urine, Clean Catch  Result Value Ref Range Status   Specimen Description URINE, CLEAN CATCH  Final    Special Requests   Final    NONE Performed at [facility], [address] [place], [place] [address]    Culture (A)  Final    >=100,000 COLONIES/mL PROTEUS MIRABILIS >=100,000 COLONIES/mL ESCHERICHIA COLI    Report Status 12/08/2020 FINAL  Final   Organism ID, Bacteria PROTEUS MIRABILIS (A)  Final   Organism ID, Bacteria ESCHERICHIA COLI (A)  Final      Susceptibility   Escherichia coli - MIC*    AMPICILLIN 4 SENSITIVE Sensitive     CEFAZOLIN <=4 SENSITIVE Sensitive  CEFEPIME <=0.12 SENSITIVE Sensitive     CEFTRIAXONE <=0.25 SENSITIVE Sensitive     CIPROFLOXACIN <=0.25 SENSITIVE Sensitive     GENTAMICIN <=1 SENSITIVE Sensitive     IMIPENEM <=0.25 SENSITIVE Sensitive     NITROFURANTOIN <=16 SENSITIVE Sensitive     TRIMETH/SULFA <=20 SENSITIVE Sensitive     AMPICILLIN/SULBACTAM <=2 SENSITIVE Sensitive     PIP/TAZO <=4 SENSITIVE Sensitive     * >=100,000 COLONIES/mL ESCHERICHIA COLI   Proteus mirabilis - MIC*    AMPICILLIN <=2 SENSITIVE Sensitive     CEFAZOLIN <=4 SENSITIVE Sensitive     CEFEPIME <=0.12 SENSITIVE Sensitive     CEFTRIAXONE <=0.25 SENSITIVE Sensitive     CIPROFLOXACIN <=0.25 SENSITIVE Sensitive     GENTAMICIN <=1 SENSITIVE Sensitive     IMIPENEM 1 SENSITIVE Sensitive     NITROFURANTOIN 128 RESISTANT Resistant     TRIMETH/SULFA <=20 SENSITIVE Sensitive     AMPICILLIN/SULBACTAM <=2 SENSITIVE Sensitive     PIP/TAZO <=4 SENSITIVE Sensitive     * >=100,000 COLONIES/mL PROTEUS MIRABILIS  Culture, Urine     Status: Abnormal   Collection Time: 12/06/20  4:06 PM   Specimen: Urine, Random  Result Value Ref Range Status   Specimen Description URINE, RANDOM  Final   Special Requests   Final    NONE Performed at [facility], [address] [place], [place] [address]    Culture >=100,000 COLONIES/mL ESCHERICHIA COLI (A)  Final   Report Status 12/08/2020 FINAL  Final   Organism ID, Bacteria ESCHERICHIA COLI (A)  Final      Susceptibility    Escherichia coli - MIC*    AMPICILLIN 8 SENSITIVE Sensitive     CEFAZOLIN <=4 SENSITIVE Sensitive     CEFEPIME <=0.12 SENSITIVE Sensitive     CEFTRIAXONE <=0.25 SENSITIVE Sensitive     CIPROFLOXACIN <=0.25 SENSITIVE Sensitive     GENTAMICIN <=1 SENSITIVE Sensitive     IMIPENEM <=0.25 SENSITIVE Sensitive     NITROFURANTOIN 32 SENSITIVE Sensitive     TRIMETH/SULFA <=20 SENSITIVE Sensitive     AMPICILLIN/SULBACTAM 4 SENSITIVE Sensitive     PIP/TAZO <=4 SENSITIVE Sensitive     * >=100,000 COLONIES/mL ESCHERICHIA COLI  Culture, blood (single)     Status: None   Collection Time: 12/07/20 10:04 AM   Specimen: BLOOD RIGHT ARM  Result Value Ref Range Status   Specimen Description BLOOD RIGHT ARM  Final   Special Requests   Final    BOTTLES DRAWN AEROBIC ONLY Blood Culture results may not be optimal due to an inadequate volume of blood received in culture bottles   Culture   Final    NO GROWTH 5 DAYS Performed at [facility], [address] [place], [place] [address]    Report Status 12/12/2020 FINAL  Final  SARS CORONAVIRUS 2 (TAT 6-24 HRS) Nasopharyngeal Nasopharyngeal Swab     Status: None   Collection Time: 12/11/20  4:12 PM   Specimen: Nasopharyngeal Swab  Result Value Ref Range Status   SARS Coronavirus 2 NEGATIVE NEGATIVE Final    Comment: (NOTE) SARS-CoV-2 target nucleic acids are NOT DETECTED.  The SARS-CoV-2 RNA is generally detectable in upper and lower respiratory specimens during the acute phase of infection. Negative results do not preclude SARS-CoV-2 infection, do not rule out co-infections with other pathogens, and should not be used as the sole basis for treatment or other patient management decisions. Negative results must be combined with clinical observations, patient history, and epidemiological information.  The expected result is Negative.  Fact Sheet for Patients: HairSlick.nohttps://www.fda.gov/media/138098/download  Fact Sheet for Healthcare  Providers: quierodirigir.comhttps://www.fda.gov/media/138095/download  This test is not yet approved or cleared by the [place] FDA and  has been authorized for detection and/or diagnosis of SARS-CoV-2 by FDA under an Emergency Use Authorization (EUA). This EUA will remain  in effect (meaning this test can be used) for the duration of the COVID-19 declaration under Se ction 564(b)(1) of the Act, 21 U.S.C. section 360bbb-3(b)(1), unless the authorization is terminated or revoked sooner.  Performed at [facility], [address] [place], [place] [address]          Radiology Studies: CT ABDOMEN PELVIS WO CONTRAST  Result Date: 12/11/2020 CLINICAL DATA:  Pyelonephritis.  Hematuria.  Hydronephrosis. EXAM: CT ABDOMEN AND PELVIS WITHOUT CONTRAST TECHNIQUE: Multidetector CT imaging of the abdomen and pelvis was performed following the standard protocol without IV contrast. COMPARISON:  None. FINDINGS: Lower chest: No acute findings. Hepatobiliary: No mass visualized on this unenhanced exam. Gallbladder is unremarkable. No evidence of biliary ductal dilatation. Pancreas: No mass or inflammatory process visualized on this unenhanced exam. Spleen:  Within normal limits in size. Adrenals/Urinary tract: A few tiny less than 5 mm right renal calculi are seen. Right renal swelling and mild perinephric soft tissue stranding are seen. Mild right hydroureteronephrosis is seen to the level of the urinary bladder. A 2-3 mm calculus is seen in the bladder in the region of the right UVJ. A 3.7 cm diverticulum is seen arising from the anterior bladder dome. Beam hardening artifact noted through the lower pelvis due to large habitus. Stomach/Bowel: No evidence of obstruction, inflammatory process, or abnormal fluid collections. Vascular/Lymphatic: Shotty sub-cm abdominal retroperitoneal lymph nodes are seen. One pathologically enlarged lymph node is seen in the left common iliac chain measuring 1.4 cm on image 53/3. No  evidence of abdominal aortic aneurysm. Reproductive: A few small uterine fibroids are seen. Adnexal regions are unremarkable. Other:  Mild diffuse body wall edema. Musculoskeletal:  No suspicious bone lesions identified. IMPRESSION: Mild right hydroureteronephrosis due to 2-3 mm calculus in bladder in the region of the right UVJ. Tiny right renal calculi. 3.7 cm bladder diverticulum. Small uterine fibroids. Mild left common iliac lymphadenopathy, which is nonspecific. Recommend continued follow-up by CT in 3 months. Electronically Signed   By: Danae OrleansJohn A Stahl M.D.   On: 12/11/2020 15:52   CT LUMBAR SPINE WO CONTRAST  Result Date: 12/11/2020 CLINICAL DATA:  70 year old female with persistent low back pain. UTI, acute renal injury. Lumbar spondylolisthesis as L3 pars fracture on recent radiographs. EXAM: CT LUMBAR SPINE WITHOUT CONTRAST TECHNIQUE: Multidetector CT imaging of the lumbar spine was performed without intravenous contrast administration. Multiplanar CT image reconstructions were also generated. COMPARISON:  Lumbar radiographs 12/05/2020. FINDINGS: Segmentation: Lumbar segmentation appears to be normal and will be designated as such for this report. Alignment: Preserved lumbar lordosis with multilevel mild spondylolisthesis. Grade 1 anterolisthesis of L4 on L5 measures 4-5 mm. 2-3 mm of anterolisthesis of L3 on L4. Mild retrolisthesis of L1 on L2. Vertebrae: Visible lower thoracic vertebrae and ribs appear intact. Widespread degenerative sclerosis of the lumbar posterior elements, see details below. Despite the multilevel spondylolisthesis. There is no lumbar pars defect.- No acute osseous abnormality identified. Visible sacrum and SI joints appear intact. Paraspinal and other soft tissues: Mild respiratory motion at the lung bases. Small gastric hiatal hernia suspected. Aortoiliac calcified atherosclerosis. Right side nephrolithiasis (series 5 image 53) is associated with asymmetric enlargement of  the  right kidney and possible right hydronephrosis. Furthermore there appears to be asymmetric right periureteral stranding and asymmetric right hydroureter continuing into the pelvis (series 5, image 140). Distal ureter and bladder not included. The contralateral left kidney and ureter appear comparatively normal. Negative visible other abdominal and pelvic viscera. Lumbar paraspinal soft tissues are within normal limits. Disc levels: Intermittent lower thoracic interbody ankylosis due to flowing endplate osteophytes (sagittal image 30) including at T11-T12. T12-L1: Mild to moderate facet hypertrophy greater on the right. Mild disc bulge and endplate spurring. No significant stenosis. L1-L2: Mild to moderate facet and ligament flavum hypertrophy. Mild disc bulge and endplate spurring. Mild spinal stenosis and up to moderate lateral recess and foraminal stenosis suspected. L2-L3: Mild disc bulge. Moderate to severe facet and ligament flavum hypertrophy. Mild if any stenosis. L3-L4: Anterolisthesis with severe facet arthropathy including bilateral vacuum facet. Disc bulge with posterior element hypertrophy with combined moderate to severe spinal stenosis. Mild to moderate L3 foraminal stenosis. L4-L5: Anterolisthesis with circumferential disc bulge and severe facet arthropathy. Bilateral vacuum facet. Moderate to severe posterior element hypertrophy. Moderate to severe spinal and left lateral recess stenosis. At least moderate L4 foraminal stenosis. L5-S1: Severe facet hypertrophy greater on the right, but with evidence of solid bilateral facet ankylosis. Circumferential disc bulge and endplate spurring. No spinal stenosis, but mild to moderate neural foraminal stenosis suspected and greater on the left. IMPRESSION: 1. Appearance suspicious for acute obstructive uropathy on the right, with superimposed right nephrolithiasis. Recommend follow-up noncontrast CT Abdomen and Pelvis for confirmation. 2.  No acute osseous  abnormality in the lumbar spine. Multilevel lumbar spondylolisthesis with severe facet arthropathy L3-L4 through L5-S1. Superimposed Ankylosed facets at L5-S1. And lower thoracic interbody ankylosis. Multifactorial moderate to severe spinal and/or lateral recess stenosis at L3-L4 and L4-L5. Mild degenerative spinal stenosis suspected at L1-L2 and L2-L3. 3. Aortic Atherosclerosis (ICD10-I70.0). Electronically Signed   By: Odessa Fleming M.D.   On: 12/11/2020 10:45        Scheduled Meds: . aspirin EC  81 mg Oral Daily  . cephALEXin  500 mg Oral Q8H  . cyclobenzaprine  5 mg Oral TID  . ezetimibe  10 mg Oral Daily  . levothyroxine  100 mcg Oral Q0600  . lidocaine  1 patch Transdermal Q24H  . tamsulosin  0.4 mg Oral QPC breakfast   Continuous Infusions:    LOS: 7 days    Time spent: 30 minutes    Dorcas Carrow, MD [facility] Pager [phone]

## 2020-12-13 DIAGNOSIS — E559 Vitamin D deficiency, unspecified: Secondary | ICD-10-CM | POA: Diagnosis not present

## 2020-12-13 DIAGNOSIS — I10 Essential (primary) hypertension: Secondary | ICD-10-CM | POA: Diagnosis not present

## 2020-12-13 DIAGNOSIS — R2243 Localized swelling, mass and lump, lower limb, bilateral: Secondary | ICD-10-CM | POA: Diagnosis not present

## 2020-12-13 DIAGNOSIS — N289 Disorder of kidney and ureter, unspecified: Secondary | ICD-10-CM | POA: Diagnosis not present

## 2020-12-13 DIAGNOSIS — E039 Hypothyroidism, unspecified: Secondary | ICD-10-CM

## 2020-12-13 DIAGNOSIS — E785 Hyperlipidemia, unspecified: Secondary | ICD-10-CM | POA: Diagnosis not present

## 2020-12-13 DIAGNOSIS — Z7401 Bed confinement status: Secondary | ICD-10-CM | POA: Diagnosis not present

## 2020-12-13 DIAGNOSIS — N179 Acute kidney failure, unspecified: Secondary | ICD-10-CM | POA: Diagnosis not present

## 2020-12-13 DIAGNOSIS — R079 Chest pain, unspecified: Secondary | ICD-10-CM | POA: Diagnosis not present

## 2020-12-13 DIAGNOSIS — M549 Dorsalgia, unspecified: Secondary | ICD-10-CM | POA: Diagnosis not present

## 2020-12-13 DIAGNOSIS — D638 Anemia in other chronic diseases classified elsewhere: Secondary | ICD-10-CM | POA: Diagnosis not present

## 2020-12-13 DIAGNOSIS — D649 Anemia, unspecified: Secondary | ICD-10-CM | POA: Diagnosis not present

## 2020-12-13 DIAGNOSIS — R5381 Other malaise: Secondary | ICD-10-CM | POA: Diagnosis not present

## 2020-12-13 DIAGNOSIS — M6281 Muscle weakness (generalized): Secondary | ICD-10-CM | POA: Diagnosis not present

## 2020-12-13 DIAGNOSIS — M2550 Pain in unspecified joint: Secondary | ICD-10-CM | POA: Diagnosis not present

## 2020-12-13 DIAGNOSIS — R609 Edema, unspecified: Secondary | ICD-10-CM | POA: Diagnosis not present

## 2020-12-13 DIAGNOSIS — R0789 Other chest pain: Secondary | ICD-10-CM | POA: Diagnosis not present

## 2020-12-13 DIAGNOSIS — N3000 Acute cystitis without hematuria: Secondary | ICD-10-CM | POA: Diagnosis not present

## 2020-12-13 DIAGNOSIS — M1710 Unilateral primary osteoarthritis, unspecified knee: Secondary | ICD-10-CM | POA: Diagnosis not present

## 2020-12-13 DIAGNOSIS — L98419 Non-pressure chronic ulcer of buttock with unspecified severity: Secondary | ICD-10-CM | POA: Diagnosis not present

## 2020-12-13 DIAGNOSIS — D72829 Elevated white blood cell count, unspecified: Secondary | ICD-10-CM | POA: Diagnosis not present

## 2020-12-13 DIAGNOSIS — M545 Low back pain, unspecified: Secondary | ICD-10-CM | POA: Diagnosis not present

## 2020-12-13 DIAGNOSIS — N132 Hydronephrosis with renal and ureteral calculous obstruction: Secondary | ICD-10-CM | POA: Diagnosis not present

## 2020-12-13 LAB — CBC WITH DIFFERENTIAL/PLATELET
Abs Immature Granulocytes: 0.27 10*3/uL — ABNORMAL HIGH (ref 0.00–0.07)
Basophils Absolute: 0 10*3/uL (ref 0.0–0.1)
Basophils Relative: 0 %
Eosinophils Absolute: 0.2 10*3/uL (ref 0.0–0.5)
Eosinophils Relative: 1 %
HCT: 25.7 % — ABNORMAL LOW (ref 36.0–46.0)
Hemoglobin: 8.5 g/dL — ABNORMAL LOW (ref 12.0–15.0)
Immature Granulocytes: 2 %
Lymphocytes Relative: 8 %
Lymphs Abs: 1.2 10*3/uL (ref 0.7–4.0)
MCH: 28 pg (ref 26.0–34.0)
MCHC: 33.1 g/dL (ref 30.0–36.0)
MCV: 84.5 fL (ref 80.0–100.0)
Monocytes Absolute: 0.9 10*3/uL (ref 0.1–1.0)
Monocytes Relative: 6 %
Neutro Abs: 13 10*3/uL — ABNORMAL HIGH (ref 1.7–7.7)
Neutrophils Relative %: 83 %
Platelets: 336 10*3/uL (ref 150–400)
RBC: 3.04 MIL/uL — ABNORMAL LOW (ref 3.87–5.11)
RDW: 15.8 % — ABNORMAL HIGH (ref 11.5–15.5)
WBC: 15.5 10*3/uL — ABNORMAL HIGH (ref 4.0–10.5)
nRBC: 0 % (ref 0.0–0.2)

## 2020-12-13 LAB — COMPREHENSIVE METABOLIC PANEL
ALT: 50 U/L — ABNORMAL HIGH (ref 0–44)
AST: 37 U/L (ref 15–41)
Albumin: 2 g/dL — ABNORMAL LOW (ref 3.5–5.0)
Alkaline Phosphatase: 98 U/L (ref 38–126)
Anion gap: 8 (ref 5–15)
BUN: 19 mg/dL (ref 8–23)
CO2: 25 mmol/L (ref 22–32)
Calcium: 8.2 mg/dL — ABNORMAL LOW (ref 8.9–10.3)
Chloride: 104 mmol/L (ref 98–111)
Creatinine, Ser: 1.33 mg/dL — ABNORMAL HIGH (ref 0.44–1.00)
GFR, Estimated: 43 mL/min — ABNORMAL LOW (ref >60)
Glucose, Bld: 103 mg/dL — ABNORMAL HIGH (ref 70–99)
Potassium: 5.1 mmol/L (ref 3.5–5.1)
Sodium: 137 mmol/L (ref 135–145)
Total Bilirubin: 0.7 mg/dL (ref 0.3–1.2)
Total Protein: 6.4 g/dL — ABNORMAL LOW (ref 6.5–8.1)

## 2020-12-13 LAB — MAGNESIUM: Magnesium: 2.1 mg/dL (ref 1.7–2.4)

## 2020-12-13 MED ORDER — CEPHALEXIN 500 MG PO CAPS
500.0000 mg | ORAL_CAPSULE | Freq: Three times a day (TID) | ORAL | Status: DC
  Administered 2020-12-13: 500 mg via ORAL
  Filled 2020-12-13: qty 1

## 2020-12-13 MED ORDER — CEPHALEXIN 500 MG PO CAPS: 500.0000 mg | ORAL_CAPSULE | Freq: Three times a day (TID) | ORAL | Status: DC

## 2020-12-13 MED ORDER — TAMSULOSIN HCL 0.4 MG PO CAPS: 0.4000 mg | ORAL_CAPSULE | Freq: Every day | ORAL | Status: DC

## 2020-12-13 MED ORDER — LIDOCAINE 5 % EX PTCH: 1.0000 | MEDICATED_PATCH | CUTANEOUS | 0 refills | Status: DC

## 2020-12-13 NOTE — Progress Notes (Addendum)
Urology Inpatient Progress Report  Low back pain [M54.50] Acute renal insufficiency [N28.9] Renal insufficiency [N28.9] NSTEMI (non-ST elevated myocardial infarction) (HCC) [I21.4]        Intv/Subj: No acute events overnight. Patient is complaining of low back pain  Principal Problem:   Chest pain Active Problems:   Hypothyroidism   Hypertension   Hypercholesterolemia   Renal insufficiency   Normocytic anemia   Thrombocytopenia (HCC)   Leukocytosis   Pressure injury of skin  Current Facility-Administered Medications  Medication Dose Route Frequency Provider Last Rate Last Admin  . acetaminophen (TYLENOL) tablet 650 mg  650 mg Oral Q4H PRN Briscoe Deutscher, MD   650 mg at 12/10/20 2059  . aspirin EC tablet 81 mg  81 mg Oral Daily Opyd, Lavone Neri, MD   81 mg at 12/13/20 0827  . cephALEXin (KEFLEX) capsule 500 mg  500 mg Oral Q8H Vann, Jessica U, DO   500 mg at 12/13/20 0827  . cyclobenzaprine (FLEXERIL) tablet 5 mg  5 mg Oral TID Dorcas Carrow, MD   5 mg at 12/13/20 0827  . ezetimibe (ZETIA) tablet 10 mg  10 mg Oral Daily Alberteen Sam, MD   10 mg at 12/13/20 0827  . levothyroxine (SYNTHROID) tablet 100 mcg  100 mcg Oral Q0600 Briscoe Deutscher, MD   100 mcg at 12/13/20 0655  . lidocaine (LIDODERM) 5 % 1 patch  1 patch Transdermal Q24H Dorcas Carrow, MD   1 patch at 12/13/20 0831  . nitroGLYCERIN (NITROSTAT) SL tablet 0.4 mg  0.4 mg Sublingual Q5 Min x 3 PRN Opyd, Lavone Neri, MD      . ondansetron (ZOFRAN) injection 4 mg  4 mg Intravenous Q6H PRN Opyd, Lavone Neri, MD      . oxyCODONE (Oxy IR/ROXICODONE) immediate release tablet 5 mg  5 mg Oral Q6H PRN Dorcas Carrow, MD   5 mg at 12/13/20 0319  . polyethylene glycol (MIRALAX / GLYCOLAX) packet 17 g  17 g Oral Daily Dorcas Carrow, MD   17 g at 12/13/20 0826  . tamsulosin (FLOMAX) capsule 0.4 mg  0.4 mg Oral QPC breakfast Dorcas Carrow, MD   0.4 mg at 12/13/20 0826     Objective: Vital: Vitals:   12/12/20 1207  12/12/20 1730 12/13/20 0105 12/13/20 0547  BP: (!) 126/54 (!) 133/53 137/64 134/64  Pulse: 64 76 88 82  Resp: 18 18 18 18   Temp: 98.8 F (37.1 C) 98.7 F (37.1 C) 99.6 F (37.6 C) 99.2 F (37.3 C)  TempSrc: Oral Oral Oral Oral  SpO2: 92% 99% 98% 98%  Weight:      Height:       I/Os: I/O last 3 completed shifts: In: 600 [P.O.:600] Out: 1700 [Urine:1700]  Physical Exam:  General: Patient is in no apparent distress Lungs: Normal respiratory effort, chest expands symmetrically. GI:   The abdomen is soft and nontender without mass., Mild right CVA tenderness, significant low midline back tenderness Ext: lower extremities symmetric  Lab Results: Recent Labs    12/11/20 0427 12/12/20 0332 12/13/20 0330  WBC 18.6* 19.6* 15.5*  HGB 8.9* 8.9* 8.5*  HCT 26.9* 25.3* 25.7*   Recent Labs    12/11/20 0427 12/13/20 0330  NA 139 137  K 5.0 5.1  CL 105 104  CO2 25 25  GLUCOSE 94 103*  BUN 32* 19  CREATININE 1.48* 1.33*  CALCIUM 8.2* 8.2*   No results for input(s): LABPT, INR in the last 72 hours. No results for  input(s): LABURIN in the last 72 hours. Results for orders placed or performed during the hospital encounter of 12/04/20  SARS Coronavirus 2 by RT PCR (hospital order, performed in [facility] hospital lab) Nasopharyngeal Nasopharyngeal Swab     Status: None   Collection Time: 12/05/20 12:26 AM   Specimen: Nasopharyngeal Swab  Result Value Ref Range Status   SARS Coronavirus 2 NEGATIVE NEGATIVE Final    Comment: (NOTE) SARS-CoV-2 target nucleic acids are NOT DETECTED.  The SARS-CoV-2 RNA is generally detectable in upper and lower respiratory specimens during the acute phase of infection. The lowest concentration of SARS-CoV-2 viral copies this assay can detect is 250 copies / mL. A negative result does not preclude SARS-CoV-2 infection and should not be used as the sole basis for treatment or other patient management decisions.  A negative result may occur  with improper specimen collection / handling, submission of specimen other than nasopharyngeal swab, presence of viral mutation(s) within the areas targeted by this assay, and inadequate number of viral copies (<250 copies / mL). A negative result must be combined with clinical observations, patient history, and epidemiological information.  Fact Sheet for Patients:   BoilerBrush.com.cy  Fact Sheet for Healthcare Providers: https://pope.com/  This test is not yet approved or  cleared by the [place] FDA and has been authorized for detection and/or diagnosis of SARS-CoV-2 by FDA under an Emergency Use Authorization (EUA).  This EUA will remain in effect (meaning this test can be used) for the duration of the COVID-19 declaration under Section 564(b)(1) of the Act, 21 U.S.C. section 360bbb-3(b)(1), unless the authorization is terminated or revoked sooner.  Performed at [facility], [address] [place], [place] [address]   Culture, Urine     Status: Abnormal   Collection Time: 12/06/20  9:59 AM   Specimen: Urine, Clean Catch  Result Value Ref Range Status   Specimen Description URINE, CLEAN CATCH  Final   Special Requests   Final    NONE Performed at [facility], [address], [place], [place] [address]    Culture (A)  Final    >=100,000 COLONIES/mL PROTEUS MIRABILIS >=100,000 COLONIES/mL ESCHERICHIA COLI    Report Status 12/08/2020 FINAL  Final   Organism ID, Bacteria PROTEUS MIRABILIS (A)  Final   Organism ID, Bacteria ESCHERICHIA COLI (A)  Final      Susceptibility   Escherichia coli - MIC*    AMPICILLIN 4 SENSITIVE Sensitive     CEFAZOLIN <=4 SENSITIVE Sensitive     CEFEPIME <=0.12 SENSITIVE Sensitive     CEFTRIAXONE <=0.25 SENSITIVE Sensitive     CIPROFLOXACIN <=0.25 SENSITIVE Sensitive     GENTAMICIN <=1 SENSITIVE Sensitive     IMIPENEM <=0.25 SENSITIVE Sensitive     NITROFURANTOIN <=16  SENSITIVE Sensitive     TRIMETH/SULFA <=20 SENSITIVE Sensitive     AMPICILLIN/SULBACTAM <=2 SENSITIVE Sensitive     PIP/TAZO <=4 SENSITIVE Sensitive     * >=100,000 COLONIES/mL ESCHERICHIA COLI   Proteus mirabilis - MIC*    AMPICILLIN <=2 SENSITIVE Sensitive     CEFAZOLIN <=4 SENSITIVE Sensitive     CEFEPIME <=0.12 SENSITIVE Sensitive     CEFTRIAXONE <=0.25 SENSITIVE Sensitive     CIPROFLOXACIN <=0.25 SENSITIVE Sensitive     GENTAMICIN <=1 SENSITIVE Sensitive     IMIPENEM 1 SENSITIVE Sensitive     NITROFURANTOIN 128 RESISTANT Resistant     TRIMETH/SULFA <=20 SENSITIVE Sensitive     AMPICILLIN/SULBACTAM <=2 SENSITIVE Sensitive  PIP/TAZO <=4 SENSITIVE Sensitive     * >=100,000 COLONIES/mL PROTEUS MIRABILIS  Culture, Urine     Status: Abnormal   Collection Time: 12/06/20  4:06 PM   Specimen: Urine, Random  Result Value Ref Range Status   Specimen Description URINE, RANDOM  Final   Special Requests   Final    NONE Performed at [facility], [address] [place], [place] [address]    Culture >=100,000 COLONIES/mL ESCHERICHIA COLI (A)  Final   Report Status 12/08/2020 FINAL  Final   Organism ID, Bacteria ESCHERICHIA COLI (A)  Final      Susceptibility   Escherichia coli - MIC*    AMPICILLIN 8 SENSITIVE Sensitive     CEFAZOLIN <=4 SENSITIVE Sensitive     CEFEPIME <=0.12 SENSITIVE Sensitive     CEFTRIAXONE <=0.25 SENSITIVE Sensitive     CIPROFLOXACIN <=0.25 SENSITIVE Sensitive     GENTAMICIN <=1 SENSITIVE Sensitive     IMIPENEM <=0.25 SENSITIVE Sensitive     NITROFURANTOIN 32 SENSITIVE Sensitive     TRIMETH/SULFA <=20 SENSITIVE Sensitive     AMPICILLIN/SULBACTAM 4 SENSITIVE Sensitive     PIP/TAZO <=4 SENSITIVE Sensitive     * >=100,000 COLONIES/mL ESCHERICHIA COLI  Culture, blood (single)     Status: None   Collection Time: 12/07/20 10:04 AM   Specimen: BLOOD RIGHT ARM  Result Value Ref Range Status   Specimen Description BLOOD RIGHT ARM  Final   Special Requests    Final    BOTTLES DRAWN AEROBIC ONLY Blood Culture results may not be optimal due to an inadequate volume of blood received in culture bottles   Culture   Final    NO GROWTH 5 DAYS Performed at [facility], [address], [place], [place] [address]    Report Status 12/12/2020 FINAL  Final  SARS CORONAVIRUS 2 (TAT 6-24 HRS) Nasopharyngeal Nasopharyngeal Swab     Status: None   Collection Time: 12/11/20  4:12 PM   Specimen: Nasopharyngeal Swab  Result Value Ref Range Status   SARS Coronavirus 2 NEGATIVE NEGATIVE Final    Comment: (NOTE) SARS-CoV-2 target nucleic acids are NOT DETECTED.  The SARS-CoV-2 RNA is generally detectable in upper and lower respiratory specimens during the acute phase of infection. Negative results do not preclude SARS-CoV-2 infection, do not rule out co-infections with other pathogens, and should not be used as the sole basis for treatment or other patient management decisions. Negative results must be combined with clinical observations, patient history, and epidemiological information. The expected result is Negative.  Fact Sheet for Patients: HairSlick.no  Fact Sheet for Healthcare Providers: quierodirigir.com  This test is not yet approved or cleared by the [place] FDA and  has been authorized for detection and/or diagnosis of SARS-CoV-2 by FDA under an Emergency Use Authorization (EUA). This EUA will remain  in effect (meaning this test can be used) for the duration of the COVID-19 declaration under Se ction 564(b)(1) of the Act, 21 U.S.C. section 360bbb-3(b)(1), unless the authorization is terminated or revoked sooner.  Performed at [facility], [address] [place], [place] [address]     Studies/Results: CT ABDOMEN PELVIS WO CONTRAST  Result Date: 12/11/2020 CLINICAL DATA:  Pyelonephritis.  Hematuria.  Hydronephrosis. EXAM: CT ABDOMEN AND PELVIS WITHOUT CONTRAST  TECHNIQUE: Multidetector CT imaging of the abdomen and pelvis was performed following the standard protocol without IV contrast. COMPARISON:  None. FINDINGS: Lower chest: No acute findings. Hepatobiliary: No mass visualized on  this unenhanced exam. Gallbladder is unremarkable. No evidence of biliary ductal dilatation. Pancreas: No mass or inflammatory process visualized on this unenhanced exam. Spleen:  Within normal limits in size. Adrenals/Urinary tract: A few tiny less than 5 mm right renal calculi are seen. Right renal swelling and mild perinephric soft tissue stranding are seen. Mild right hydroureteronephrosis is seen to the level of the urinary bladder. A 2-3 mm calculus is seen in the bladder in the region of the right UVJ. A 3.7 cm diverticulum is seen arising from the anterior bladder dome. Beam hardening artifact noted through the lower pelvis due to large habitus. Stomach/Bowel: No evidence of obstruction, inflammatory process, or abnormal fluid collections. Vascular/Lymphatic: Shotty sub-cm abdominal retroperitoneal lymph nodes are seen. One pathologically enlarged lymph node is seen in the left common iliac chain measuring 1.4 cm on image 53/3. No evidence of abdominal aortic aneurysm. Reproductive: A few small uterine fibroids are seen. Adnexal regions are unremarkable. Other:  Mild diffuse body wall edema. Musculoskeletal:  No suspicious bone lesions identified. IMPRESSION: Mild right hydroureteronephrosis due to 2-3 mm calculus in bladder in the region of the right UVJ. Tiny right renal calculi. 3.7 cm bladder diverticulum. Small uterine fibroids. Mild left common iliac lymphadenopathy, which is nonspecific. Recommend continued follow-up by CT in 3 months. Electronically Signed   By: Danae Orleans M.D.   On: 12/11/2020 15:52   CT LUMBAR SPINE WO CONTRAST  Result Date: 12/11/2020 CLINICAL DATA:  70 year old female with persistent low back pain. UTI, acute renal injury. Lumbar spondylolisthesis as  L3 pars fracture on recent radiographs. EXAM: CT LUMBAR SPINE WITHOUT CONTRAST TECHNIQUE: Multidetector CT imaging of the lumbar spine was performed without intravenous contrast administration. Multiplanar CT image reconstructions were also generated. COMPARISON:  Lumbar radiographs 12/05/2020. FINDINGS: Segmentation: Lumbar segmentation appears to be normal and will be designated as such for this report. Alignment: Preserved lumbar lordosis with multilevel mild spondylolisthesis. Grade 1 anterolisthesis of L4 on L5 measures 4-5 mm. 2-3 mm of anterolisthesis of L3 on L4. Mild retrolisthesis of L1 on L2. Vertebrae: Visible lower thoracic vertebrae and ribs appear intact. Widespread degenerative sclerosis of the lumbar posterior elements, see details below. Despite the multilevel spondylolisthesis. There is no lumbar pars defect.- No acute osseous abnormality identified. Visible sacrum and SI joints appear intact. Paraspinal and other soft tissues: Mild respiratory motion at the lung bases. Small gastric hiatal hernia suspected. Aortoiliac calcified atherosclerosis. Right side nephrolithiasis (series 5 image 53) is associated with asymmetric enlargement of the right kidney and possible right hydronephrosis. Furthermore there appears to be asymmetric right periureteral stranding and asymmetric right hydroureter continuing into the pelvis (series 5, image 140). Distal ureter and bladder not included. The contralateral left kidney and ureter appear comparatively normal. Negative visible other abdominal and pelvic viscera. Lumbar paraspinal soft tissues are within normal limits. Disc levels: Intermittent lower thoracic interbody ankylosis due to flowing endplate osteophytes (sagittal image 30) including at T11-T12. T12-L1: Mild to moderate facet hypertrophy greater on the right. Mild disc bulge and endplate spurring. No significant stenosis. L1-L2: Mild to moderate facet and ligament flavum hypertrophy. Mild disc bulge  and endplate spurring. Mild spinal stenosis and up to moderate lateral recess and foraminal stenosis suspected. L2-L3: Mild disc bulge. Moderate to severe facet and ligament flavum hypertrophy. Mild if any stenosis. L3-L4: Anterolisthesis with severe facet arthropathy including bilateral vacuum facet. Disc bulge with posterior element hypertrophy with combined moderate to severe spinal stenosis. Mild to moderate L3 foraminal stenosis. L4-L5: Anterolisthesis with  circumferential disc bulge and severe facet arthropathy. Bilateral vacuum facet. Moderate to severe posterior element hypertrophy. Moderate to severe spinal and left lateral recess stenosis. At least moderate L4 foraminal stenosis. L5-S1: Severe facet hypertrophy greater on the right, but with evidence of solid bilateral facet ankylosis. Circumferential disc bulge and endplate spurring. No spinal stenosis, but mild to moderate neural foraminal stenosis suspected and greater on the left. IMPRESSION: 1. Appearance suspicious for acute obstructive uropathy on the right, with superimposed right nephrolithiasis. Recommend follow-up noncontrast CT Abdomen and Pelvis for confirmation. 2.  No acute osseous abnormality in the lumbar spine. Multilevel lumbar spondylolisthesis with severe facet arthropathy L3-L4 through L5-S1. Superimposed Ankylosed facets at L5-S1. And lower thoracic interbody ankylosis. Multifactorial moderate to severe spinal and/or lateral recess stenosis at L3-L4 and L4-L5. Mild degenerative spinal stenosis suspected at L1-L2 and L2-L3. 3. Aortic Atherosclerosis (ICD10-I70.0). Electronically Signed   By: Odessa FlemingH  Hall M.D.   On: 12/11/2020 10:45    Assessment: Low grade fevers overnight consistent with pyelonephritis - stone a non-issue at this point.  WBC improving.  HD stable.    Plan: No urologic intervention planned for UVJ stone which likely at this point has passed.  F/u in clinic in 2 weeks.  We will contact patient with appt.    Signing  off.  Thank you!   Berniece SalinesBenjamin Herrick, MD Urology 12/13/2020, 10:17 AM

## 2020-12-13 NOTE — Care Management Important Message (Signed)
Important Message  Patient Details  Name: Cassidy Austin MRN: [id number] Date of Birth: 1950/11/24   Medicare Important Message Given:  Yes     Oralia Rud Coleby Yett 12/13/2020, 2:46 PM

## 2020-12-13 NOTE — Progress Notes (Signed)
RN called and attempted to give report and receptionist stated she would have someone call me back to get report.

## 2020-12-13 NOTE — TOC Transition Note (Addendum)
Transition of Care [facility]) - CM/SW Discharge Note   Patient Details  Name: Cassidy Austin MRN: [id number] Date of Birth: 1951-07-10  [facility] CM/SW Contact:  Jimmy Picket, LCSWA Phone Number: 12/13/2020, 12:27 PM   Clinical Narrative:     Pt will discharge to [facility] via ptar. Pt insurance Berkley Harvey is pending, reference number [id number]. Pt is covid negative. Pt will notify her nephew of DC.  Nurse to call report to [phone].  Final next level of care: Skilled Nursing Facility Barriers to Discharge: Barriers Resolved   Patient Goals and CMS Choice Patient states their goals for this hospitalization and ongoing recovery are:: to get better to return home CMS Medicare.gov Compare Post Acute Care list provided to:: Patient Choice offered to / list presented to : Patient  Discharge Placement              Patient chooses bed at: [facility] Patient to be transferred to facility by: ptar Name of family member notified: Pt will notify family Patient and family notified of of transfer: 12/13/20  Discharge Plan and Services                                     Social Determinants of Health (SDOH) Interventions     Readmission Risk Interventions No flowsheet data found.  Jimmy Picket, Theresia Majors, [place] Clinical Social Worker [phone]

## 2020-12-13 NOTE — Discharge Summary (Signed)
Physician Discharge Summary  Eniya Cannady LEX:[id number] DOB: 11-22-1950 DOA: 12/04/2020  PCP: Laurann Montana, MD  Admit date: 12/04/2020 Discharge date: 12/13/2020  Admitted From: Home Discharge disposition: Skilled nursing facility   Recommendations for Outpatient Follow-Up:   1. Antibiotics to 2/15 2. Will need outpatient urology follow-up to ensure hydronephrosis resolves 3. BMP 1 week   Discharge Diagnosis:   Principal Problem:   Chest pain Active Problems:   Hypothyroidism   Hypertension   Hypercholesterolemia   Renal insufficiency   Normocytic anemia   Thrombocytopenia (HCC)   Leukocytosis   Pressure injury of skin    Discharge Condition: Improved.  Diet recommendation: Low sodium, heart healthy  Wound care: None.  Code status: Full.   History of Present Illness:   Cassidy Austin is a 70 y.o. female with medical history significant for hypertension, hypothyroidism, and chronic bilateral leg swelling, now presenting to the emergency department for evaluation of chest pain, shortness of breath, and fatigue.  Patient has had decreased activity tolerance, exertional dyspnea, and increased fatigue over the past 4 days, and also had some chest discomfort with exertion over the past few days that was mild and quickly resolved with rest.  Yesterday (12/04/2020) she was doing some light housework when she became short of breath and had more intense chest discomfort.  She began to feel better within several minutes of sitting down but had recurrent symptoms when she attempted to resume activity.  She denies any recent cough, fevers, increased leg swelling, or orthopnea.  Denies any melena or hematochezia.  Reports that she had some blood work performed in October 2021 with her PCP which she believes was fairly normal.  She was taken off iron supplements recently.  She has naproxen on her medication list but does not use it.  She noticed some gross hematuria  yesterday without any dysuria or flank pain though she has recently had some midline low back pain without radiation.  She takes Lasix twice a day for chronic leg swelling.   Hospital Course by Problem:   Acute kidney injury: -creatinine is gradually improving  -Initial renal ultrasound was without evidence of hydronephrosis or obstruction. Recent outpatient kidney function test on 07/21 was normal. -CT scan of the lumbar spine was done due to persistent back pain that showed right-sided hydronephrosis. Renal ultrasound 2/1 did not show any hydronephrosis. Patient continues to have high white count, a dedicated abdomen pelvis CT scan showed mild hydronephrosis on the right side, 5 mm nonobstructive ureteric calculi, 3 mm right VUJ calculi that is probably obstructive. -urology to follow up outpatient  Severe sepsis due to E. coli UTI: Present on admission.  Resolved.  Blood cultures negative.  Urine culture with pansensitive E. coli.  Treated with Rocephin, changed to oral cephalosporin for total  14 days.    Atypical chest pain with demand ischemia: Evaluated by cardiology.  Echocardiogram was normal.  If recurrent chest pain suggested nuclear stress test.  Currently no indication for treatment.  Essential hypertension: Currently on carvedilol.  Blood pressures normal or low normal.  Transaminitis: Mild elevated transaminases probably due to transient shock liver.  Improved and normalized.  Hypothyroidism: Clinically euthyroid on current dose of thyroxine.  Debility/deconditioning: Work with PT OT.  She will benefit with short-term rehab.  Refer to SNF.  Back pain: Localized back pain after manual work at home.  No evidence of complication.  Will use local lidocaine patch and oxycodone for pain relief, avoid NSAIDs.  Continue  to work with PT OT. Persistent pain, CT scan with chronic degenerative changes.  No acute disease or vertebral fracture.      Medical Consultants:    urology   Discharge Exam:   Vitals:   12/13/20 0105 12/13/20 0547  BP: 137/64 134/64  Pulse: 88 82  Resp: 18 18  Temp: 99.6 F (37.6 C) 99.2 F (37.3 C)  SpO2: 98% 98%   Vitals:   12/12/20 1207 12/12/20 1730 12/13/20 0105 12/13/20 0547  BP: (!) 126/54 (!) 133/53 137/64 134/64  Pulse: 64 76 88 82  Resp: 18 18 18 18   Temp: 98.8 F (37.1 C) 98.7 F (37.1 C) 99.6 F (37.6 C) 99.2 F (37.3 C)  TempSrc: Oral Oral Oral Oral  SpO2: 92% 99% 98% 98%  Weight:      Height:        General exam: Appears calm and comfortable.  The results of significant diagnostics from this hospitalization (including imaging, microbiology, ancillary and laboratory) are listed below for reference.     Procedures and Diagnostic Studies:   DG Chest 2 View  Result Date: 12/04/2020 CLINICAL DATA:  Chest pain. EXAM: CHEST - 2 VIEW COMPARISON:  None. FINDINGS: The heart size and mediastinal contours are within normal limits. Both lungs are clear. No pneumothorax or pleural effusion is noted. The visualized skeletal structures are unremarkable. IMPRESSION: No active cardiopulmonary disease. Aortic Atherosclerosis (ICD10-I70.0). Electronically Signed   By: Lupita Raider M.D.   On: 12/04/2020 18:09   DG Lumbar Spine 2-3 Views  Result Date: 12/05/2020 CLINICAL DATA:  Initially presented with chest pain now with low back pain EXAM: LUMBAR SPINE - 2-3 VIEW COMPARISON:  None. FINDINGS: Five lumbar type vertebral levels. Dextrocurvature of the lumbar spine with an apex at the L3 level. There is grade 1 anterolisthesis L3 on 4, L4 on 5 with what are likely L3 pars defects, age indeterminate. Findings on a background of diffuse intervertebral disc height loss with multilevel discogenic and facet degenerative changes well as interspinous arthrosis compatible with Baastrup's disease. Normal bone mineralization. No conspicuous lytic or blastic lesions are seen. Included portions of the bony pelvis of appear grossly  intact and congruent albeit with degenerative changes in the imaged hips and SI joints. Normal bowel gas pattern. Atherosclerotic calcification of the aorta. Remaining soft tissues are unremarkable. IMPRESSION: 1. 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 Correlate for point tenderness. 2. No other acute osseous abnormality or traumatic malalignment. 3. Background of moderate multilevel discogenic and facet degenerative changes of the lumbar spine. Electronically Signed   By: Kreg Shropshire M.D.   On: 12/05/2020 02:49   US RENAL  Result Date: 12/05/2020 CLINICAL DATA:  Renal insufficiency EXAM: RENAL / URINARY TRACT ULTRASOUND COMPLETE COMPARISON:  None. FINDINGS: Right Kidney: Renal measurements: 13.6 x 6.7 x 6.2 = volume: 297.7 mL. Suboptimal visualization with extensive rib shadowing and partial obscuration of portions of the renal parenchyma. No visible lesion, shadowing calculus or hydronephrosis. Cortical echogenicity is grossly normal. Left Kidney: Renal measurements: 10.7 x 6.1 x 6.0 = volume: 205.6 mL. Suboptimal visualization with extensive rib shadowing and partial obscuration of portions of the renal parenchyma. No visible lesion, shadowing calculus or hydronephrosis. Cortical echogenicity is grossly normal. Bladder: Bladder partially decompressed with some mild trabeculation. A small 2.3 x 1.9 x 3.7 cm bladder diverticulum is noted along the right lateral aspect of the urinary bladder. Other: Markedly suboptimal assessment of the bilateral  kidneys due to a combination of body habitus, bowel gas, poor sonographic windowing and inability to lay flat or reposition in decubitus. IMPRESSION: 1. Suboptimal visualization of the kidneys due to body habitus, rib shadowing and inability to lay flat or reposition in decubitus position. 2. No gross renal abnormality is seen though portions of renal parenchyma incompletely visualized. 3. Mild trabeculation of  the bladder with small 3.7 cm bladder diverticulum. Correlate with urinalysis to exclude superimposed cystitis. Electronically Signed   By: Kreg Shropshire M.D.   On: 12/05/2020 03:26   ECHOCARDIOGRAM COMPLETE  Result Date: 12/05/2020    ECHOCARDIOGRAM REPORT   Patient Name:   AJAHNAE RATHGEBER Date of Exam: 12/05/2020 Medical Rec #:  [id number]        Height:       66.0 in Accession #:    [id number]       Weight:       270.0 lb Date of Birth:  [birth year]        BSA:          2.272 m Patient Age:    70 years         BP:           96/54 mmHg Patient Gender: F                HR:           77 bpm. Exam Location:  Inpatient Procedure: 2D Echo, 3D Echo, Color Doppler, Cardiac Doppler and Strain Analysis Indications:    Elevated Troponin                 Chest Pain R07.9  History:        Patient has no prior history of Echocardiogram examinations.                 Signs/Symptoms:Shortness of Breath; Risk Factors:Hypertension                 and Dyslipidemia. Leukocytosis; anemia; thrombocytopenia.  Sonographer:    Leta Jungling RDCS Referring Phys: [id number] TIMOTHY S OPYD IMPRESSIONS  1. Left ventricular ejection fraction, by estimation, is 60 to 65%. The left ventricle has normal function. The left ventricle has no regional wall motion abnormalities. There is mild concentric left ventricular hypertrophy. Left ventricular diastolic parameters are consistent with Grade I diastolic dysfunction (impaired relaxation). The average left ventricular global longitudinal strain is -15.8 %. The global longitudinal strain is abnormal.  2. Right ventricular systolic function is normal. The right ventricular size is normal.  3. The mitral valve is normal in structure. No evidence of mitral valve regurgitation. No evidence of mitral stenosis.  4. The aortic valve is normal in structure. Aortic valve regurgitation is not visualized. No aortic stenosis is present.  5. The inferior vena cava is normal in size with greater than 50% respiratory  variability, suggesting right atrial pressure of 3 mmHg. FINDINGS  Left Ventricle: Left ventricular ejection fraction, by estimation, is 60 to 65%. The left ventricle has normal function. The left ventricle has no regional wall motion abnormalities. The average left ventricular global longitudinal strain is -15.8 %. The global longitudinal strain is abnormal. The left ventricular internal cavity size was normal in size. There is mild concentric left ventricular hypertrophy. Left ventricular diastolic parameters are consistent with Grade I diastolic dysfunction (impaired relaxation). Indeterminate filling pressures. Right Ventricle: The right ventricular size is normal. No increase in right ventricular wall thickness. Right ventricular systolic function is normal. Left Atrium:  Left atrial size was normal in size. Right Atrium: Right atrial size was normal in size. Pericardium: There is no evidence of pericardial effusion. Mitral Valve: The mitral valve is normal in structure. No evidence of mitral valve regurgitation. No evidence of mitral valve stenosis. Tricuspid Valve: The tricuspid valve is normal in structure. Tricuspid valve regurgitation is not demonstrated. No evidence of tricuspid stenosis. Aortic Valve: The aortic valve is normal in structure. Aortic valve regurgitation is not visualized. No aortic stenosis is present. Aortic valve mean gradient measures 7.9 mmHg. Aortic valve peak gradient measures 12.6 mmHg. Aortic valve area, by VTI measures 2.35 cm. Pulmonic Valve: The pulmonic valve was normal in structure. Pulmonic valve regurgitation is not visualized. No evidence of pulmonic stenosis. Aorta: The aortic root is normal in size and structure. Venous: The inferior vena cava is normal in size with greater than 50% respiratory variability, suggesting right atrial pressure of 3 mmHg. IAS/Shunts: No atrial level shunt detected by color flow Doppler.  LEFT VENTRICLE PLAX 2D LVIDd:         3.90 cm   Diastology LVIDs:         2.60 cm  LV e' medial:    5.00 cm/s LV PW:         1.20 cm  LV E/e' medial:  13.0 LV IVS:        1.50 cm  LV e' lateral:   8.38 cm/s LVOT diam:     2.20 cm  LV E/e' lateral: 7.8 LV SV:         76 LV SV Index:   33       2D Longitudinal Strain LVOT Area:     3.80 cm 2D Strain GLS Avg:     -15.8 %                          3D Volume EF:                         3D EF:        59 %                         LV EDV:       195 ml                         LV ESV:       80 ml                         LV SV:        115 ml RIGHT VENTRICLE RV S prime:     13.50 cm/s TAPSE (M-mode): 2.0 cm LEFT ATRIUM             Index       RIGHT ATRIUM           Index LA diam:        2.70 cm 1.19 cm/m  RA Area:     12.40 cm LA Vol (A2C):   30.5 ml 13.43 ml/m RA Volume:   25.00 ml  11.00 ml/m LA Vol (A4C):   27.6 ml 12.15 ml/m LA Biplane Vol: 29.2 ml 12.85 ml/m  AORTIC VALVE AV Area (Vmax):    2.31 cm AV Area (Vmean):   2.38 cm AV Area (VTI):     2.35 cm  AV Vmax:           177.82 cm/s AV Vmean:          133.377 cm/s AV VTI:            0.324 m AV Peak Grad:      12.6 mmHg AV Mean Grad:      7.9 mmHg LVOT Vmax:         108.00 cm/s LVOT Vmean:        83.500 cm/s LVOT VTI:          0.200 m LVOT/AV VTI ratio: 0.62  AORTA Ao Root diam: 3.40 cm Ao Asc diam:  3.50 cm MITRAL VALVE MV Area (PHT): 2.42 cm    SHUNTS MV Decel Time: 313 msec    Systemic VTI:  0.20 m MV E velocity: 65.10 cm/s  Systemic Diam: 2.20 cm MV A velocity: 78.00 cm/s MV E/A ratio:  0.83 Mihai Croitoru MD Electronically signed by Thurmon FairMihai Croitoru MD Signature Date/Time: 12/05/2020/9:21:45 AM    Final      Labs:   Basic Metabolic Panel: Recent Labs  Lab 12/08/20 0755 12/09/20 0026 12/10/20 0053 12/11/20 0427 12/13/20 0330  NA 136 135 139 139 137  K 3.3* 4.1 4.9 5.0 5.1  CL 105 104 107 105 104  CO2 22 20* 23 25 25   GLUCOSE 122* 134* 106* 94 103*  BUN 79* 68* 46* 32* 19  CREATININE 2.38* 2.04* 1.53* 1.48* 1.33*  CALCIUM 7.7* 7.7* 8.0* 8.2*  8.2*  MG 2.7*  --   --   --  2.1   GFR Estimated Creatinine Clearance: 54.8 mL/min (A) (by C-G formula based on SCr of 1.33 mg/dL (H)). Liver Function Tests: Recent Labs  Lab 12/08/20 0755 12/13/20 0330  AST 28 37  ALT 46* 50*  ALKPHOS 91 98  BILITOT 0.7 0.7  PROT 6.2* 6.4*  ALBUMIN 1.9* 2.0*   No results for input(s): LIPASE, AMYLASE in the last 168 hours. No results for input(s): AMMONIA in the last 168 hours. Coagulation profile No results for input(s): INR, PROTIME in the last 168 hours.  CBC: Recent Labs  Lab 12/09/20 0026 12/10/20 0053 12/11/20 0427 12/12/20 0332 12/13/20 0330  WBC 19.1* 18.2* 18.6* 19.6* 15.5*  NEUTROABS 15.6* 14.4* 15.4* 16.5* 13.0*  HGB 9.2* 9.5* 8.9* 8.9* 8.5*  HCT 25.7* 28.0* 26.9* 25.3* 25.7*  MCV 81.1 83.1 83.8 83.8 84.5  PLT 182 223 259 327 336   Cardiac Enzymes: No results for input(s): CKTOTAL, CKMB, CKMBINDEX, TROPONINI in the last 168 hours. BNP: Invalid input(s): POCBNP CBG: No results for input(s): GLUCAP in the last 168 hours. D-Dimer No results for input(s): DDIMER in the last 72 hours. Hgb A1c No results for input(s): HGBA1C in the last 72 hours. Lipid Profile No results for input(s): CHOL, HDL, LDLCALC, TRIG, CHOLHDL, LDLDIRECT in the last 72 hours. Thyroid function studies No results for input(s): TSH, T4TOTAL, T3FREE, THYROIDAB in the last 72 hours.  Invalid input(s): FREET3 Anemia work up No results for input(s): VITAMINB12, FOLATE, FERRITIN, TIBC, IRON, RETICCTPCT in the last 72 hours. Microbiology Recent Results (from the past 240 hour(s))  SARS Coronavirus 2 by RT PCR (hospital order, performed in [facility] hospital lab) Nasopharyngeal Nasopharyngeal Swab     Status: None   Collection Time: 12/05/20 12:26 AM   Specimen: Nasopharyngeal Swab  Result Value Ref Range Status   SARS Coronavirus 2 NEGATIVE NEGATIVE Final    Comment: (NOTE) SARS-CoV-2 target nucleic acids are NOT DETECTED.  The SARS-CoV-2 RNA is  generally detectable in upper and lower respiratory specimens during the acute phase of infection. The lowest concentration of SARS-CoV-2 viral copies this assay can detect is 250 copies / mL. A negative result does not preclude SARS-CoV-2 infection and should not be used as the sole basis for treatment or other patient management decisions.  A negative result may occur with improper specimen collection / handling, submission of specimen other than nasopharyngeal swab, presence of viral mutation(s) within the areas targeted by this assay, and inadequate number of viral copies (<250 copies / mL). A negative result must be combined with clinical observations, patient history, and epidemiological information.  Fact Sheet for Patients:   BoilerBrush.com.cy  Fact Sheet for Healthcare Providers: https://pope.com/  This test is not yet approved or  cleared by the [place] FDA and has been authorized for detection and/or diagnosis of SARS-CoV-2 by FDA under an Emergency Use Authorization (EUA).  This EUA will remain in effect (meaning this test can be used) for the duration of the COVID-19 declaration under Section 564(b)(1) of the Act, 21 U.S.C. section 360bbb-3(b)(1), unless the authorization is terminated or revoked sooner.  Performed at [facility], [address] [place], [place] [address]   Culture, Urine     Status: Abnormal   Collection Time: 12/06/20  9:59 AM   Specimen: Urine, Clean Catch  Result Value Ref Range Status   Specimen Description URINE, CLEAN CATCH  Final   Special Requests   Final    NONE Performed at [facility], [address] [place], [place] [address]    Culture (A)  Final    >=100,000 COLONIES/mL PROTEUS MIRABILIS >=100,000 COLONIES/mL ESCHERICHIA COLI    Report Status 12/08/2020 FINAL  Final   Organism ID, Bacteria PROTEUS MIRABILIS (A)  Final   Organism ID, Bacteria ESCHERICHIA COLI  (A)  Final      Susceptibility   Escherichia coli - MIC*    AMPICILLIN 4 SENSITIVE Sensitive     CEFAZOLIN <=4 SENSITIVE Sensitive     CEFEPIME <=0.12 SENSITIVE Sensitive     CEFTRIAXONE <=0.25 SENSITIVE Sensitive     CIPROFLOXACIN <=0.25 SENSITIVE Sensitive     GENTAMICIN <=1 SENSITIVE Sensitive     IMIPENEM <=0.25 SENSITIVE Sensitive     NITROFURANTOIN <=16 SENSITIVE Sensitive     TRIMETH/SULFA <=20 SENSITIVE Sensitive     AMPICILLIN/SULBACTAM <=2 SENSITIVE Sensitive     PIP/TAZO <=4 SENSITIVE Sensitive     * >=100,000 COLONIES/mL ESCHERICHIA COLI   Proteus mirabilis - MIC*    AMPICILLIN <=2 SENSITIVE Sensitive     CEFAZOLIN <=4 SENSITIVE Sensitive     CEFEPIME <=0.12 SENSITIVE Sensitive     CEFTRIAXONE <=0.25 SENSITIVE Sensitive     CIPROFLOXACIN <=0.25 SENSITIVE Sensitive     GENTAMICIN <=1 SENSITIVE Sensitive     IMIPENEM 1 SENSITIVE Sensitive     NITROFURANTOIN 128 RESISTANT Resistant     TRIMETH/SULFA <=20 SENSITIVE Sensitive     AMPICILLIN/SULBACTAM <=2 SENSITIVE Sensitive     PIP/TAZO <=4 SENSITIVE Sensitive     * >=100,000 COLONIES/mL PROTEUS MIRABILIS  Culture, Urine     Status: Abnormal   Collection Time: 12/06/20  4:06 PM   Specimen: Urine, Random  Result Value Ref Range Status   Specimen Description URINE, RANDOM  Final   Special Requests   Final    NONE Performed at [facility], [address] [place], [place] [address]    Culture >=100,000 COLONIES/mL  ESCHERICHIA COLI (A)  Final   Report Status 12/08/2020 FINAL  Final   Organism ID, Bacteria ESCHERICHIA COLI (A)  Final      Susceptibility   Escherichia coli - MIC*    AMPICILLIN 8 SENSITIVE Sensitive     CEFAZOLIN <=4 SENSITIVE Sensitive     CEFEPIME <=0.12 SENSITIVE Sensitive     CEFTRIAXONE <=0.25 SENSITIVE Sensitive     CIPROFLOXACIN <=0.25 SENSITIVE Sensitive     GENTAMICIN <=1 SENSITIVE Sensitive     IMIPENEM <=0.25 SENSITIVE Sensitive     NITROFURANTOIN 32 SENSITIVE Sensitive      TRIMETH/SULFA <=20 SENSITIVE Sensitive     AMPICILLIN/SULBACTAM 4 SENSITIVE Sensitive     PIP/TAZO <=4 SENSITIVE Sensitive     * >=100,000 COLONIES/mL ESCHERICHIA COLI  Culture, blood (single)     Status: None   Collection Time: 12/07/20 10:04 AM   Specimen: BLOOD RIGHT ARM  Result Value Ref Range Status   Specimen Description BLOOD RIGHT ARM  Final   Special Requests   Final    BOTTLES DRAWN AEROBIC ONLY Blood Culture results may not be optimal due to an inadequate volume of blood received in culture bottles   Culture   Final    NO GROWTH 5 DAYS Performed at [facility], [address] [place], [place] [address]    Report Status 12/12/2020 FINAL  Final  SARS CORONAVIRUS 2 (TAT 6-24 HRS) Nasopharyngeal Nasopharyngeal Swab     Status: None   Collection Time: 12/11/20  4:12 PM   Specimen: Nasopharyngeal Swab  Result Value Ref Range Status   SARS Coronavirus 2 NEGATIVE NEGATIVE Final    Comment: (NOTE) SARS-CoV-2 target nucleic acids are NOT DETECTED.  The SARS-CoV-2 RNA is generally detectable in upper and lower respiratory specimens during the acute phase of infection. Negative results do not preclude SARS-CoV-2 infection, do not rule out co-infections with other pathogens, and should not be used as the sole basis for treatment or other patient management decisions. Negative results must be combined with clinical observations, patient history, and epidemiological information. The expected result is Negative.  Fact Sheet for Patients: HairSlick.no  Fact Sheet for Healthcare Providers: quierodirigir.com  This test is not yet approved or cleared by the [place] FDA and  has been authorized for detection and/or diagnosis of SARS-CoV-2 by FDA under an Emergency Use Authorization (EUA). This EUA will remain  in effect (meaning this test can be used) for the duration of the COVID-19 declaration under Se ction  564(b)(1) of the Act, 21 U.S.C. section 360bbb-3(b)(1), unless the authorization is terminated or revoked sooner.  Performed at [facility], [address] [place], [place] [address]      Discharge Instructions:   Discharge Instructions    Diet - low sodium heart healthy   Complete by: As directed    Increase activity slowly   Complete by: As directed    No wound care   Complete by: As directed      Allergies as of 12/13/2020   No Known Allergies     Medication List    STOP taking these medications   carvedilol 12.5 MG tablet Commonly known as: COREG   furosemide 40 MG tablet Commonly known as: LASIX   Klor-Con M20 20 MEQ tablet Generic drug: potassium chloride SA   olmesartan 40 MG tablet Commonly known as: BENICAR     TAKE these medications   aspirin 81 MG chewable tablet Chew 81 mg by mouth every morning.  cephALEXin 500 MG capsule Commonly known as: KEFLEX Take 1 capsule (500 mg total) by mouth every 8 (eight) hours.   cholecalciferol 1000 units tablet Commonly known as: VITAMIN D Take 1,000 Units by mouth 2 (two) times daily.   cyclobenzaprine 5 MG tablet Commonly known as: FLEXERIL Take 1 tablet (5 mg total) by mouth 3 (three) times daily as needed for muscle spasms.   ezetimibe 10 MG tablet Commonly known as: ZETIA Take 10 mg by mouth daily.   levothyroxine 100 MCG tablet Commonly known as: SYNTHROID Take 100 mcg by mouth daily at 6 (six) AM.   lidocaine 5 % Commonly known as: LIDODERM Place 1 patch onto the skin daily. Remove & Discard patch within 12 hours or as directed by MD   Magnesium 250 MG Tabs Take 500 mg by mouth every morning.   oxyCODONE 5 MG immediate release tablet Commonly known as: Oxy IR/ROXICODONE Take 1 tablet (5 mg total) by mouth every 6 (six) hours as needed for up to 5 days for severe pain or moderate pain.   tamsulosin 0.4 MG Caps capsule Commonly known as: FLOMAX Take 1 capsule (0.4 mg total) by mouth  daily after breakfast.   tetrahydrozoline 0.05 % ophthalmic solution Apply 1 drop to eye as needed. Visine for dry eyes   TURMERIC PO Take 2 tablets by mouth daily.   vitamin B-12 1000 MCG tablet Commonly known as: CYANOCOBALAMIN Take 2,000 mcg by mouth every morning.   vitamin C 500 MG tablet Commonly known as: ASCORBIC ACID Take 500 mg by mouth daily.            Durable Medical Equipment  (From admission, onward)         Start     Ordered   12/07/20 1014  For home use only DME standard manual wheelchair with seat cushion  Once       Comments: Patient suffers from Severe sepsis due to UTI which impairs their ability to perform daily activities like ambulating  in the home.  A cane  will not resolve issue with performing activities of daily living. A wheelchair will allow patient to safely perform daily activities. Patient can safely propel the wheelchair in the home or has a caregiver who can provide assistance. Length of need lifetime . Accessories: elevating leg rests (ELRs), wheel locks, extensions and anti-tippers.  Severe sepsis due to UTI Seat and back cushions   12/07/20 1015          Contact information for follow-up providers    Corky Crafts, MD Follow up.   Specialties: Cardiology, Radiology, Interventional Cardiology Why: Hospital follow-up scheduled for 01/08/2021 at 10:00am. Please arrive 15 minutes early for check-in. If this date/time does not work for you, please call our office to reschedule. Contact information: [address] [place] [address] [phone]        Crist Fat, MD In 2 weeks.   Specialty: Urology Contact information: [address] [place] [address] [phone]            Contact information for after-discharge care    Destination    [facility] .   Service: Skilled Nursing Contact information: [address] [place]  [address] [phone]                   Time coordinating discharge: 35 min  Signed:  Joseph Art DO  [facility] 12/13/2020, 7:36 AM

## 2020-12-14 DIAGNOSIS — L98419 Non-pressure chronic ulcer of buttock with unspecified severity: Secondary | ICD-10-CM | POA: Diagnosis not present

## 2020-12-19 DIAGNOSIS — R609 Edema, unspecified: Secondary | ICD-10-CM | POA: Diagnosis not present

## 2020-12-19 DIAGNOSIS — M545 Low back pain, unspecified: Secondary | ICD-10-CM | POA: Diagnosis not present

## 2020-12-19 DIAGNOSIS — E039 Hypothyroidism, unspecified: Secondary | ICD-10-CM | POA: Diagnosis not present

## 2020-12-19 DIAGNOSIS — N179 Acute kidney failure, unspecified: Secondary | ICD-10-CM | POA: Diagnosis not present

## 2020-12-19 DIAGNOSIS — D649 Anemia, unspecified: Secondary | ICD-10-CM | POA: Diagnosis not present

## 2020-12-19 DIAGNOSIS — I10 Essential (primary) hypertension: Secondary | ICD-10-CM | POA: Diagnosis not present

## 2020-12-25 DIAGNOSIS — R609 Edema, unspecified: Secondary | ICD-10-CM | POA: Diagnosis not present

## 2020-12-25 DIAGNOSIS — N179 Acute kidney failure, unspecified: Secondary | ICD-10-CM | POA: Diagnosis not present

## 2020-12-25 DIAGNOSIS — D649 Anemia, unspecified: Secondary | ICD-10-CM | POA: Diagnosis not present

## 2020-12-25 DIAGNOSIS — M545 Low back pain, unspecified: Secondary | ICD-10-CM | POA: Diagnosis not present

## 2020-12-28 DIAGNOSIS — R609 Edema, unspecified: Secondary | ICD-10-CM | POA: Diagnosis not present

## 2020-12-28 DIAGNOSIS — N289 Disorder of kidney and ureter, unspecified: Secondary | ICD-10-CM | POA: Diagnosis not present

## 2020-12-28 DIAGNOSIS — R252 Cramp and spasm: Secondary | ICD-10-CM | POA: Diagnosis not present

## 2020-12-28 DIAGNOSIS — E039 Hypothyroidism, unspecified: Secondary | ICD-10-CM | POA: Diagnosis not present

## 2020-12-28 DIAGNOSIS — E7800 Pure hypercholesterolemia, unspecified: Secondary | ICD-10-CM | POA: Diagnosis not present

## 2020-12-28 DIAGNOSIS — M545 Low back pain, unspecified: Secondary | ICD-10-CM | POA: Diagnosis not present

## 2020-12-29 ENCOUNTER — Other Ambulatory Visit: Payer: Self-pay

## 2020-12-29 ENCOUNTER — Emergency Department (HOSPITAL_COMMUNITY)
Admission: EM | Admit: 2020-12-29 | Discharge: 2020-12-30 | Disposition: A | Discharge status: Home / Self Care | Payer: Medicare PPO | Attending: Emergency Medicine | Admitting: Emergency Medicine

## 2020-12-29 ENCOUNTER — Encounter (HOSPITAL_COMMUNITY): Payer: Self-pay

## 2020-12-29 DIAGNOSIS — M5416 Radiculopathy, lumbar region: Secondary | ICD-10-CM | POA: Insufficient documentation

## 2020-12-29 DIAGNOSIS — M545 Low back pain, unspecified: Secondary | ICD-10-CM | POA: Diagnosis not present

## 2020-12-29 DIAGNOSIS — R011 Cardiac murmur, unspecified: Secondary | ICD-10-CM | POA: Insufficient documentation

## 2020-12-29 DIAGNOSIS — Z7982 Long term (current) use of aspirin: Secondary | ICD-10-CM | POA: Insufficient documentation

## 2020-12-29 DIAGNOSIS — Z79899 Other long term (current) drug therapy: Secondary | ICD-10-CM | POA: Diagnosis not present

## 2020-12-29 DIAGNOSIS — E039 Hypothyroidism, unspecified: Secondary | ICD-10-CM | POA: Diagnosis not present

## 2020-12-29 DIAGNOSIS — M549 Dorsalgia, unspecified: Secondary | ICD-10-CM | POA: Diagnosis not present

## 2020-12-29 DIAGNOSIS — I10 Essential (primary) hypertension: Secondary | ICD-10-CM | POA: Diagnosis not present

## 2020-12-29 DIAGNOSIS — R252 Cramp and spasm: Secondary | ICD-10-CM | POA: Diagnosis not present

## 2020-12-29 DIAGNOSIS — R609 Edema, unspecified: Secondary | ICD-10-CM | POA: Diagnosis not present

## 2020-12-29 HISTORY — DX: Spondylolisthesis, lumbar region: M43.16

## 2020-12-29 LAB — URINALYSIS, ROUTINE W REFLEX MICROSCOPIC
Bilirubin Urine: NEGATIVE
Glucose, UA: NEGATIVE mg/dL
Hgb urine dipstick: NEGATIVE
Ketones, ur: NEGATIVE mg/dL
Nitrite: NEGATIVE
Protein, ur: NEGATIVE mg/dL
Specific Gravity, Urine: 1.009 (ref 1.005–1.030)
pH: 6 (ref 5.0–8.0)

## 2020-12-29 MED ORDER — FENTANYL CITRATE (PF) 100 MCG/2ML IJ SOLN
75.0000 ug | Freq: Once | INTRAMUSCULAR | Status: AC
  Administered 2020-12-29: 75 ug via INTRAVENOUS
  Filled 2020-12-29: qty 2

## 2020-12-29 MED ORDER — OXYCODONE-ACETAMINOPHEN 5-325 MG PO TABS
1.0000 | ORAL_TABLET | Freq: Once | ORAL | Status: AC
  Administered 2020-12-29: 1 via ORAL
  Filled 2020-12-29: qty 1

## 2020-12-29 MED ORDER — DEXAMETHASONE SODIUM PHOSPHATE 10 MG/ML IJ SOLN
8.0000 mg | Freq: Once | INTRAMUSCULAR | Status: AC
  Administered 2020-12-29: 8 mg via INTRAVENOUS
  Filled 2020-12-29: qty 1

## 2020-12-29 MED ORDER — TIZANIDINE HCL 2 MG PO TABS: 2.0000 mg | ORAL_TABLET | Freq: Three times a day (TID) | ORAL | 0 refills | Status: DC | PRN

## 2020-12-29 MED ORDER — METHOCARBAMOL 500 MG PO TABS
500.0000 mg | ORAL_TABLET | Freq: Once | ORAL | Status: AC
  Administered 2020-12-29: 500 mg via ORAL
  Filled 2020-12-29: qty 1

## 2020-12-29 MED ORDER — OXYCODONE HCL 5 MG PO TABS: 5.0000 mg | ORAL_TABLET | Freq: Four times a day (QID) | ORAL | 0 refills | Status: DC | PRN

## 2020-12-29 MED ORDER — GABAPENTIN 100 MG PO CAPS: 100.0000 mg | ORAL_CAPSULE | Freq: Three times a day (TID) | ORAL | 0 refills | Status: DC

## 2020-12-29 NOTE — ED Notes (Signed)
PTAR called for pt transfer back to [facility], urgency of return before 11pm was expressed to the dispatcher , but not ETA given.

## 2020-12-29 NOTE — ED Notes (Signed)
Pt informed of order for urine sample, states that she does not need to urinate at this time. New Purewick has been placed on pt, pt will inform staff when she is able to provide a sample.

## 2020-12-29 NOTE — ED Provider Notes (Addendum)
[facility]-EMERGENCY DEPT Provider Note   CSN: [id number] Arrival date & time: 12/29/20  1157     History Chief Complaint  Patient presents with  . Back Pain    Cassidy Austin is a 70 y.o. female past medical history of hypertension, renal insufficiency, spondylolisthesis of lumbar region, presenting to the emergency department with complaint of worsening bilateral low back pain began on Wednesday last week.  Patient states her chronic low back pain significantly worsened on Wednesday.  She is having radiating pain down bilateral posterior thighs and stops at the knee.  Her pain is making it difficult to ambulate.  Pain is also worse with sitting up in any movements.  New numbness or weakness in her extremities, no new bowel or bladder incontinence, no fevers.  No urinary symptoms.  She admitted last month for NSTEMI and acute renal insufficiency.  During her admission she was found to have right UVJ stone with some mild hydro-, also diagnosed with Pilo nephritis.  She completed his antibiotics and has no residual symptoms from that. Per review of medical record, she also had CT of her lumbar spine during admission because she continued to complain of low back pain.  Showed multilevel lumbar spondylolisthesis with severe facet arthropathy, superimposed ankylosed facets L5-S1, as well as moderate to severe spinal and/or lateral recess stenosis L3-L5, mild degenerative spinal stenosis L1-L3.  States she has been taking oxycodone for her pain as well as a muscle relaxer.  She does not feel like these medications are helping her symptoms.  She is coming to the ED today from skilled nursing facility for evaluation of her back pain. No recent falls or trauma prior to onset of worsening pain.  The history is provided by the patient and medical records.       Past Medical History:  Diagnosis Date  . Complication of anesthesia    hard to wake up after eye surgery in 1970s  .  Goiter    with thyroid nodule, FNA 12/15 benign follicular nodule  . Hypercholesterolemia   . Hypertension   . Hypothyroidism   . OA (osteoarthritis) of knee    mod.  Marland Kitchen. Spondylolisthesis of lumbar region     Patient Active Problem List   Diagnosis Date Noted  . Pressure injury of skin 12/11/2020  . Chest pain 12/05/2020  . Renal insufficiency 12/05/2020  . Normocytic anemia 12/05/2020  . Thrombocytopenia (HCC) 12/05/2020  . Leukocytosis 12/05/2020  . Acute renal insufficiency   . Low back pain   . Hypothyroidism   . Hypertension   . Complication of anesthesia   . OA (osteoarthritis) of knee   . Hypercholesterolemia   . Goiter     Past Surgical History:  Procedure Laterality Date  . COLONOSCOPY    . EYE SURGERY    . THYROID SURGERY  1999     OB History   No obstetric history on file.     Family History  Problem Relation Age of Onset  . Heart failure Mother   . Breast cancer Neg Hx     Social History   Tobacco Use  . Smoking status: Never Smoker  . Smokeless tobacco: Never Used  Vaping Use  . Vaping Use: Never used  Substance Use Topics  . Alcohol use: No  . Drug use: No    Home Medications Prior to Admission medications   Medication Sig Start Date End Date Taking? Authorizing Provider  gabapentin (NEURONTIN) 100 MG capsule Take 1 capsule (  100 mg total) by mouth 3 (three) times daily. 12/29/20  Yes Robinson, [place] N, PA-C  oxyCODONE (ROXICODONE) 5 MG immediate release tablet Take 1 tablet (5 mg total) by mouth every 6 (six) hours as needed for severe pain. 12/29/20  Yes Roxan Hockey, [place] N, PA-C  tiZANidine (ZANAFLEX) 2 MG tablet Take 1 tablet (2 mg total) by mouth every 8 (eight) hours as needed for muscle spasms. 12/29/20  Yes Robinson, [place] N, PA-C  aspirin 81 MG chewable tablet Chew 81 mg by mouth every morning.    [provider]  cephALEXin (KEFLEX) 500 MG capsule Take 1 capsule (500 mg total) by mouth every 8 (eight) hours. 12/13/20   Joseph Art, DO  cholecalciferol (VITAMIN D) 1000 UNITS tablet Take 1,000 Units by mouth 2 (two) times daily.    [provider]  ezetimibe (ZETIA) 10 MG tablet Take 10 mg by mouth daily. 11/21/20   [provider]  levothyroxine (SYNTHROID, LEVOTHROID) 100 MCG tablet Take 100 mcg by mouth daily at 6 (six) AM.    [provider]  lidocaine (LIDODERM) 5 % Place 1 patch onto the skin daily. Remove & Discard patch within 12 hours or as directed by MD 12/13/20   Joseph Art, DO  Magnesium 250 MG TABS Take 500 mg by mouth every morning.    [provider]  tamsulosin (FLOMAX) 0.4 MG CAPS capsule Take 1 capsule (0.4 mg total) by mouth daily after breakfast. 12/13/20   Marlin Canary U, DO  tetrahydrozoline 0.05 % ophthalmic solution Apply 1 drop to eye as needed. Visine for dry eyes    [provider]  TURMERIC PO Take 2 tablets by mouth daily.    [provider]  vitamin B-12 (CYANOCOBALAMIN) 1000 MCG tablet Take 2,000 mcg by mouth every morning.    [provider]  vitamin C (ASCORBIC ACID) 500 MG tablet Take 500 mg by mouth daily.    [provider]    Allergies    Patient has no known allergies.  Review of Systems   Review of Systems  Constitutional: Negative for fever.  Genitourinary: Negative for difficulty urinating, dysuria and frequency.  Musculoskeletal: Positive for back pain.  Neurological: Negative for weakness and numbness.  All other systems reviewed and are negative.   Physical Exam Updated Vital Signs BP (!) 175/83   Pulse 77   Temp 97.9 F (36.6 C) (Oral)   Resp (!) 25   Ht 5\' 6"  (1.676 m)   Wt 134 kg   SpO2 97%   BMI 47.68 kg/m   Physical Exam Vitals and nursing note reviewed.  Constitutional:      Appearance: She is well-developed and well-nourished.  HENT:     Head: Normocephalic and atraumatic.  Eyes:     Conjunctiva/sclera: Conjunctivae normal.  Cardiovascular:     Rate and Rhythm:  Normal rate and regular rhythm.     Heart sounds: Murmur heard.    Pulmonary:     Effort: Pulmonary effort is normal. No respiratory distress.     Breath sounds: Normal breath sounds.  Abdominal:     General: Bowel sounds are normal.     Palpations: Abdomen is soft.     Tenderness: There is no abdominal tenderness.  Musculoskeletal:     Comments: Generalized tenderness to lumbar region both midline and paraspinal.  However, difficult to assess due to patient pain and positioning.  Skin:    General: Skin is warm.  Neurological:  Mental Status: She is alert.     Comments: Patient has equal sensation to light touch to bilateral lower extremities.  Equal strength with dorsi and plantar flexion.  Psychiatric:        Mood and Affect: Mood and affect normal.        Behavior: Behavior normal.     ED Results / Procedures / Treatments   Labs (all labs ordered are listed, but only abnormal results are displayed) Labs Reviewed  URINALYSIS, ROUTINE W REFLEX MICROSCOPIC - Abnormal; Notable for the following components:      Result Value   Leukocytes,Ua LARGE (*)    Bacteria, UA MANY (*)    All other components within normal limits  URINE CULTURE    EKG None  Radiology No results found.  Procedures Procedures   Medications Ordered in ED Medications  fentaNYL (SUBLIMAZE) injection 75 mcg (75 mcg Intravenous Given 12/29/20 1309)  dexamethasone (DECADRON) injection 8 mg (8 mg Intravenous Given 12/29/20 1310)  oxyCODONE-acetaminophen (PERCOCET/ROXICET) 5-325 MG per tablet 1 tablet (1 tablet Oral Given 12/29/20 1754)    ED Course  I have reviewed the triage vital signs and the nursing notes.  Pertinent labs & imaging results that were available during my care of the patient were reviewed by me and considered in my medical decision making (see chart for details).    MDM Rules/Calculators/A&P                          Patient with known multilevel degenerative disease of the  lumbar spine, presenting with worsening low back pain that began last week.  She has radicular pain down the posterior thighs, no numbness or weakness.  No new incontinence.  No recent injuries or falls.  NO red flags. Neuro exam is reassuring today.  CT of the L-spine was done earlier this month during the hospital admission which showed:  "Multilevel lumbar spondylolisthesis with severe facet arthropathy L3-L4 through L5-S1.Superimposed Ankylosed facets at L5-S1. And lower thoracic interbody ankylosis.Multifactorial moderate to severe spinal and/or lateral recess stenosis at L3-L4 and L4-L5.Mild degenerative spinal stenosis suspected at L1-L2 and L2-L3."  She has positive straight leg raise bilaterally, no focal neuro deficits.  Do not believe additional imaging is indicated at this time of work-up.  Will treat pain, check urine, and reevaluate.  UA with bacteriuria, however sample was collected with pure wick.  Patient is asymptomatic for urinary symptoms, will send culture and hold on antibiotics at this time.  Patient is agreement with this plan.  Pain is improved on reevaluation.  Case management consulted, as patient states she was planning for discharge from the SNF today and does not feel as though she can go back to home.  Patient's nephew is at bedside, agrees she needs some additional care.  Case manager reports patient can return to SNF tonight before midnight for an extension of her stay.    PTAR called, RN made it known to PTAR of the time constraint of patient's transfer.   Will prescribe additional medications for symptom relief, including gabapentin, will switch Flexeril to tizanidine, and prescribed refill of oxycodone.  Patient feels comfortable with this plan.  She is provided with neurosurgery referral for outpatient follow-up.  Patient discussed with and evaluated by attending physician Dr. Jeraldine Loots.   Discussed results, findings, treatment and follow up. Patient advised  of return precautions. Patient verbalized understanding and agreed with plan. Final Clinical Impression(s) / ED Diagnoses Final diagnoses:  Lumbar radiculopathy    Rx / DC Orders ED Discharge Orders         Ordered    gabapentin (NEURONTIN) 100 MG capsule  3 times daily        12/29/20 1758    oxyCODONE (ROXICODONE) 5 MG immediate release tablet  Every 6 hours PRN        12/29/20 1758    tiZANidine (ZANAFLEX) 2 MG tablet  Every 8 hours PRN        12/29/20 1758           Robinson, [place] N, PA-C 12/29/20 1805    Robinson, [place] N, [place] 12/29/20 Nida Boatman    Gerhard Munch, MD 12/31/20 [phone]

## 2020-12-29 NOTE — Progress Notes (Addendum)
(  01/01/2021 116 pm) 530 pm [facility] will accept pt back to continue rehab. Pt understands she will have a copay. PTAR called for transport back to SNF rehab. Ed provider discussed with pt and nephew results of previous CT scan. ED provider and RN updated on dc plan back to SNF. Isidoro Donning RN CCM, [facility] ED TOC CM [phone]  515 pm TOC CM spoke to [place] and states pt can come back and do $50 copay each day if she arrives before midnight. Spoke to pt and nephew, Darren at bedside. She does want to return to [facility] but concerned about pain. Wants to speak to ED provider. ED provider updated. Isidoro Donning RN CCM, [facility] ED TOC CM [phone]  453 pm TOC CM sent message to [facility], admission director, Wille Celeste. Pt has completed her rehab days today and was suppose to dc home with family. She will check to see if [facility] was arranged. TOC CM will follow up with pt's family. ED provider updated.   Isidoro Donning RN CCM, [facility] ED TOC CM [phone]

## 2020-12-29 NOTE — TOC Progression Note (Signed)
Per Admissions Director, Wille Celeste with [facility] who states pt can arrive in the morning and still be accepted as long as it is before 11am as it is too late now for pt to be transported. CSW updated charge RN.

## 2020-12-29 NOTE — ED Triage Notes (Signed)
Pt BIB [facility] from [facility]. Pt c/o low back pain ongoing since Feb 1st. Pt reports tx for UTI and dc'd from hospital 2/9 where she went to Rehab. Pt reports continued and progressive back pain since. Pt reports muscle spasms with shooting pain into bilateral lower extremities. Pt denies any injury or trauma.

## 2020-12-29 NOTE — ED Notes (Signed)
Per charge sam the social worker spoke with facility director who stated pt should be transferred back to facility in the morning and everything as per stipulations should stay the same. Pt made aware of status change

## 2020-12-29 NOTE — ED Notes (Signed)
Purewick placed on pt with Yvette NT at this time

## 2020-12-29 NOTE — Discharge Instructions (Signed)
Please read instructions below.   Apply ice to your back for 20 minutes at a time.  You can also apply heat if this provides more relief.   You can take tizanidine every 8-12 hours as needed for muscle spasm.  Be aware this medication can make you drowsy; do not take while driving or drinking alcohol.   You can try an OTC topical voltaren (diclofenac) gel for additional pain relief. You can take oxycodone every 6 hours for severe pain. Take tylenol every 6 hours. Take the gabapentin every 8 hours, your medical provider can gradually increase this dosing for adequate relief of your nerve pain. Follow-up with the neurosurgeon (spine specialist) Return to ER if new numbness or tingling in your arms or legs, inability to urinate, inability to hold your bowels, or weakness in your extremities.

## 2020-12-30 DIAGNOSIS — Z7401 Bed confinement status: Secondary | ICD-10-CM | POA: Diagnosis not present

## 2020-12-30 DIAGNOSIS — M2550 Pain in unspecified joint: Secondary | ICD-10-CM | POA: Diagnosis not present

## 2020-12-30 DIAGNOSIS — R52 Pain, unspecified: Secondary | ICD-10-CM | POA: Diagnosis not present

## 2020-12-30 DIAGNOSIS — I10 Essential (primary) hypertension: Secondary | ICD-10-CM | POA: Diagnosis not present

## 2020-12-30 DIAGNOSIS — M549 Dorsalgia, unspecified: Secondary | ICD-10-CM | POA: Diagnosis not present

## 2020-12-30 MED ORDER — OXYCODONE-ACETAMINOPHEN 5-325 MG PO TABS
1.0000 | ORAL_TABLET | Freq: Once | ORAL | Status: AC
  Administered 2020-12-30: 1 via ORAL
  Filled 2020-12-30: qty 1

## 2020-12-30 NOTE — ED Notes (Signed)
Pt resting comfortably

## 2020-12-30 NOTE — ED Notes (Signed)
PTAR called  

## 2020-12-30 NOTE — ED Notes (Signed)
Called to give report to [facility], they will call back.

## 2021-01-01 DIAGNOSIS — R252 Cramp and spasm: Secondary | ICD-10-CM | POA: Diagnosis not present

## 2021-01-01 DIAGNOSIS — E7800 Pure hypercholesterolemia, unspecified: Secondary | ICD-10-CM | POA: Diagnosis not present

## 2021-01-01 DIAGNOSIS — R609 Edema, unspecified: Secondary | ICD-10-CM | POA: Diagnosis not present

## 2021-01-01 DIAGNOSIS — N390 Urinary tract infection, site not specified: Secondary | ICD-10-CM | POA: Diagnosis not present

## 2021-01-01 DIAGNOSIS — M545 Low back pain, unspecified: Secondary | ICD-10-CM | POA: Diagnosis not present

## 2021-01-01 DIAGNOSIS — I10 Essential (primary) hypertension: Secondary | ICD-10-CM | POA: Diagnosis not present

## 2021-01-01 LAB — URINE CULTURE: Culture: >=100000 — AB

## 2021-01-02 ENCOUNTER — Telehealth: Payer: Self-pay | Admitting: Emergency Medicine

## 2021-01-02 DIAGNOSIS — E039 Hypothyroidism, unspecified: Secondary | ICD-10-CM | POA: Diagnosis not present

## 2021-01-02 DIAGNOSIS — D649 Anemia, unspecified: Secondary | ICD-10-CM | POA: Diagnosis not present

## 2021-01-02 DIAGNOSIS — R2243 Localized swelling, mass and lump, lower limb, bilateral: Secondary | ICD-10-CM | POA: Diagnosis not present

## 2021-01-02 DIAGNOSIS — I10 Essential (primary) hypertension: Secondary | ICD-10-CM | POA: Diagnosis not present

## 2021-01-02 DIAGNOSIS — N3000 Acute cystitis without hematuria: Secondary | ICD-10-CM | POA: Diagnosis not present

## 2021-01-02 DIAGNOSIS — M1710 Unilateral primary osteoarthritis, unspecified knee: Secondary | ICD-10-CM | POA: Diagnosis not present

## 2021-01-02 DIAGNOSIS — N132 Hydronephrosis with renal and ureteral calculous obstruction: Secondary | ICD-10-CM | POA: Diagnosis not present

## 2021-01-02 DIAGNOSIS — M6281 Muscle weakness (generalized): Secondary | ICD-10-CM | POA: Diagnosis not present

## 2021-01-02 DIAGNOSIS — R079 Chest pain, unspecified: Secondary | ICD-10-CM | POA: Diagnosis not present

## 2021-01-02 NOTE — Telephone Encounter (Signed)
Post ED Visit - Positive Culture Follow-up  Culture report reviewed by antimicrobial stewardship pharmacist: [facility] []  , Pharm.D. []  Enzo Bi, Pharm.D., BCPS AQ-ID []  , Pharm.D., BCPS []  Celedonio Miyamoto, Pharm.D., BCPS []  [place], Garvin Fila.D., BCPS, AAHIVP []  , Pharm.D., BCPS, AAHIVP []  Georgina Pillion, PharmD, BCPS []  , PharmD, BCPS []  [place], PharmD, BCPS []  [place], PharmD []  , PharmD, BCPS []  Estella Husk, PharmD  Pharmacy Team []  Lysle Pearl, PharmD []  , PharmD []  Phillips Climes, PharmD []  , Rph []  Agapito Games) , PharmD []  Verlan Friends, PharmD []  , PharmD []  Mervyn Gay, PharmD []  , PharmD []  Vinnie Level, PharmD []  Wonda Olds, PharmD []  , PharmD []  Len Childs, PharmD   Positive urine culture Treated with none, asymptomatic, organism sensitive to the same and no further patient follow-up is required at this time.  01/02/2021, 10:36 AM

## 2021-01-02 NOTE — Progress Notes (Signed)
ED Antimicrobial Stewardship Positive Culture Follow Up   Cassidy Austin is an 70 y.o. female who presented to [facility] on 12/29/2020 with a chief complaint of  Chief Complaint  Patient presents with  . Back Pain    Recent Results (from the past 720 hour(s))  SARS Coronavirus 2 by RT PCR (hospital order, performed in [facility] hospital lab) Nasopharyngeal Nasopharyngeal Swab     Status: None   Collection Time: 12/05/20 12:26 AM   Specimen: Nasopharyngeal Swab  Result Value Ref Range Status   SARS Coronavirus 2 NEGATIVE NEGATIVE Final    Comment: (NOTE) SARS-CoV-2 target nucleic acids are NOT DETECTED.  The SARS-CoV-2 RNA is generally detectable in upper and lower respiratory specimens during the acute phase of infection. The lowest concentration of SARS-CoV-2 viral copies this assay can detect is 250 copies / mL. A negative result does not preclude SARS-CoV-2 infection and should not be used as the sole basis for treatment or other patient management decisions.  A negative result may occur with improper specimen collection / handling, submission of specimen other than nasopharyngeal swab, presence of viral mutation(s) within the areas targeted by this assay, and inadequate number of viral copies (<250 copies / mL). A negative result must be combined with clinical observations, patient history, and epidemiological information.  Fact Sheet for Patients:   BoilerBrush.com.cy  Fact Sheet for Healthcare Providers: https://pope.com/  This test is not yet approved or  cleared by the [place] FDA and has been authorized for detection and/or diagnosis of SARS-CoV-2 by FDA under an Emergency Use Authorization (EUA).  This EUA will remain in effect (meaning this test can be used) for the duration of the COVID-19 declaration under Section 564(b)(1) of the Act, 21 U.S.C. section 360bbb-3(b)(1), unless the authorization is terminated  or revoked sooner.  Performed at [facility], [address], [place], [place] [address]   Culture, Urine     Status: Abnormal   Collection Time: 12/06/20  9:59 AM   Specimen: Urine, Clean Catch  Result Value Ref Range Status   Specimen Description URINE, CLEAN CATCH  Final   Special Requests   Final    NONE Performed at [facility], [address], [place], [place] [address]    Culture (A)  Final    >=100,000 COLONIES/mL PROTEUS MIRABILIS >=100,000 COLONIES/mL ESCHERICHIA COLI    Report Status 12/08/2020 FINAL  Final   Organism ID, Bacteria PROTEUS MIRABILIS (A)  Final   Organism ID, Bacteria ESCHERICHIA COLI (A)  Final      Susceptibility   Escherichia coli - MIC*    AMPICILLIN 4 SENSITIVE Sensitive     CEFAZOLIN <=4 SENSITIVE Sensitive     CEFEPIME <=0.12 SENSITIVE Sensitive     CEFTRIAXONE <=0.25 SENSITIVE Sensitive     CIPROFLOXACIN <=0.25 SENSITIVE Sensitive     GENTAMICIN <=1 SENSITIVE Sensitive     IMIPENEM <=0.25 SENSITIVE Sensitive     NITROFURANTOIN <=16 SENSITIVE Sensitive     TRIMETH/SULFA <=20 SENSITIVE Sensitive     AMPICILLIN/SULBACTAM <=2 SENSITIVE Sensitive     PIP/TAZO <=4 SENSITIVE Sensitive     * >=100,000 COLONIES/mL ESCHERICHIA COLI   Proteus mirabilis - MIC*    AMPICILLIN <=2 SENSITIVE Sensitive     CEFAZOLIN <=4 SENSITIVE Sensitive     CEFEPIME <=0.12 SENSITIVE Sensitive     CEFTRIAXONE <=0.25 SENSITIVE Sensitive     CIPROFLOXACIN <=0.25 SENSITIVE Sensitive     GENTAMICIN <=1 SENSITIVE Sensitive  IMIPENEM 1 SENSITIVE Sensitive     NITROFURANTOIN 128 RESISTANT Resistant     TRIMETH/SULFA <=20 SENSITIVE Sensitive     AMPICILLIN/SULBACTAM <=2 SENSITIVE Sensitive     PIP/TAZO <=4 SENSITIVE Sensitive     * >=100,000 COLONIES/mL PROTEUS MIRABILIS  Culture, Urine     Status: Abnormal   Collection Time: 12/06/20  4:06 PM   Specimen: Urine, Random  Result Value Ref Range Status   Specimen Description URINE, RANDOM  Final   Special  Requests   Final    NONE Performed at [facility], [address], [place], [place] [address]    Culture >=100,000 COLONIES/mL ESCHERICHIA COLI (A)  Final   Report Status 12/08/2020 FINAL  Final   Organism ID, Bacteria ESCHERICHIA COLI (A)  Final      Susceptibility   Escherichia coli - MIC*    AMPICILLIN 8 SENSITIVE Sensitive     CEFAZOLIN <=4 SENSITIVE Sensitive     CEFEPIME <=0.12 SENSITIVE Sensitive     CEFTRIAXONE <=0.25 SENSITIVE Sensitive     CIPROFLOXACIN <=0.25 SENSITIVE Sensitive     GENTAMICIN <=1 SENSITIVE Sensitive     IMIPENEM <=0.25 SENSITIVE Sensitive     NITROFURANTOIN 32 SENSITIVE Sensitive     TRIMETH/SULFA <=20 SENSITIVE Sensitive     AMPICILLIN/SULBACTAM 4 SENSITIVE Sensitive     PIP/TAZO <=4 SENSITIVE Sensitive     * >=100,000 COLONIES/mL ESCHERICHIA COLI  Culture, blood (single)     Status: None   Collection Time: 12/07/20 10:04 AM   Specimen: BLOOD RIGHT ARM  Result Value Ref Range Status   Specimen Description BLOOD RIGHT ARM  Final   Special Requests   Final    BOTTLES DRAWN AEROBIC ONLY Blood Culture results may not be optimal due to an inadequate volume of blood received in culture bottles   Culture   Final    NO GROWTH 5 DAYS Performed at [facility], [address], [place], [place] [address]    Report Status 12/12/2020 FINAL  Final  SARS CORONAVIRUS 2 (TAT 6-24 HRS) Nasopharyngeal Nasopharyngeal Swab     Status: None   Collection Time: 12/11/20  4:12 PM   Specimen: Nasopharyngeal Swab  Result Value Ref Range Status   SARS Coronavirus 2 NEGATIVE NEGATIVE Final    Comment: (NOTE) SARS-CoV-2 target nucleic acids are NOT DETECTED.  The SARS-CoV-2 RNA is generally detectable in upper and lower respiratory specimens during the acute phase of infection. Negative results do not preclude SARS-CoV-2 infection, do not rule out co-infections with other pathogens, and should not be used as the sole basis for treatment or other patient  management decisions. Negative results must be combined with clinical observations, patient history, and epidemiological information. The expected result is Negative.  Fact Sheet for Patients: HairSlick.no  Fact Sheet for Healthcare Providers: quierodirigir.com  This test is not yet approved or cleared by the [place] FDA and  has been authorized for detection and/or diagnosis of SARS-CoV-2 by FDA under an Emergency Use Authorization (EUA). This EUA will remain  in effect (meaning this test can be used) for the duration of the COVID-19 declaration under Se ction 564(b)(1) of the Act, 21 U.S.C. section 360bbb-3(b)(1), unless the authorization is terminated or revoked sooner.  Performed at [facility], [address] [place], [place] [address]   Urine culture     Status: Abnormal   Collection Time: 12/29/20  2:21 PM   Specimen: Urine, Clean Catch  Result Value Ref Range  Status   Specimen Description   Final    URINE, CLEAN CATCH Performed at [facility], [address], [place], [place] [address]    Special Requests   Final    NONE Performed at [facility], [address] [place], [place] [address]    Culture >=100,000 COLONIES/mL ESCHERICHIA COLI (A)  Final   Report Status 01/01/2021 FINAL  Final   Organism ID, Bacteria ESCHERICHIA COLI (A)  Final      Susceptibility   Escherichia coli - MIC*    AMPICILLIN 8 SENSITIVE Sensitive     CEFAZOLIN <=4 SENSITIVE Sensitive     CEFEPIME <=0.12 SENSITIVE Sensitive     CEFTRIAXONE <=0.25 SENSITIVE Sensitive     CIPROFLOXACIN <=0.25 SENSITIVE Sensitive     GENTAMICIN <=1 SENSITIVE Sensitive     IMIPENEM <=0.25 SENSITIVE Sensitive     NITROFURANTOIN <=16 SENSITIVE Sensitive     TRIMETH/SULFA <=20 SENSITIVE Sensitive     AMPICILLIN/SULBACTAM 4 SENSITIVE Sensitive     PIP/TAZO <=4 SENSITIVE Sensitive     * >=100,000 COLONIES/mL  ESCHERICHIA COLI   Asymptomatic bacteruria - no antibiotics needed  ED Provider: Dr. Lorre Nick, MD   Filbert Schilder  [facility] [employer] [facility] PharmD Candidate 2022 Monday - Friday phone -  [phone] Saturday - Sunday phone - [phone]

## 2021-01-03 DIAGNOSIS — E039 Hypothyroidism, unspecified: Secondary | ICD-10-CM | POA: Diagnosis not present

## 2021-01-03 DIAGNOSIS — I10 Essential (primary) hypertension: Secondary | ICD-10-CM | POA: Diagnosis not present

## 2021-01-03 DIAGNOSIS — D638 Anemia in other chronic diseases classified elsewhere: Secondary | ICD-10-CM | POA: Diagnosis not present

## 2021-01-03 DIAGNOSIS — E785 Hyperlipidemia, unspecified: Secondary | ICD-10-CM | POA: Diagnosis not present

## 2021-01-03 DIAGNOSIS — N179 Acute kidney failure, unspecified: Secondary | ICD-10-CM | POA: Diagnosis not present

## 2021-01-03 DIAGNOSIS — M549 Dorsalgia, unspecified: Secondary | ICD-10-CM | POA: Diagnosis not present

## 2021-01-04 DIAGNOSIS — M6281 Muscle weakness (generalized): Secondary | ICD-10-CM | POA: Diagnosis not present

## 2021-01-04 DIAGNOSIS — R2243 Localized swelling, mass and lump, lower limb, bilateral: Secondary | ICD-10-CM | POA: Diagnosis not present

## 2021-01-04 DIAGNOSIS — M1710 Unilateral primary osteoarthritis, unspecified knee: Secondary | ICD-10-CM | POA: Diagnosis not present

## 2021-01-04 DIAGNOSIS — N132 Hydronephrosis with renal and ureteral calculous obstruction: Secondary | ICD-10-CM | POA: Diagnosis not present

## 2021-01-04 DIAGNOSIS — N3000 Acute cystitis without hematuria: Secondary | ICD-10-CM | POA: Diagnosis not present

## 2021-01-04 DIAGNOSIS — R079 Chest pain, unspecified: Secondary | ICD-10-CM | POA: Diagnosis not present

## 2021-01-04 DIAGNOSIS — E039 Hypothyroidism, unspecified: Secondary | ICD-10-CM | POA: Diagnosis not present

## 2021-01-04 DIAGNOSIS — I10 Essential (primary) hypertension: Secondary | ICD-10-CM | POA: Diagnosis not present

## 2021-01-04 DIAGNOSIS — D649 Anemia, unspecified: Secondary | ICD-10-CM | POA: Diagnosis not present

## 2021-01-05 DIAGNOSIS — R079 Chest pain, unspecified: Secondary | ICD-10-CM | POA: Diagnosis not present

## 2021-01-05 DIAGNOSIS — D649 Anemia, unspecified: Secondary | ICD-10-CM | POA: Diagnosis not present

## 2021-01-05 DIAGNOSIS — M6281 Muscle weakness (generalized): Secondary | ICD-10-CM | POA: Diagnosis not present

## 2021-01-05 DIAGNOSIS — N3000 Acute cystitis without hematuria: Secondary | ICD-10-CM | POA: Diagnosis not present

## 2021-01-05 DIAGNOSIS — E039 Hypothyroidism, unspecified: Secondary | ICD-10-CM | POA: Diagnosis not present

## 2021-01-05 DIAGNOSIS — R2243 Localized swelling, mass and lump, lower limb, bilateral: Secondary | ICD-10-CM | POA: Diagnosis not present

## 2021-01-05 DIAGNOSIS — N132 Hydronephrosis with renal and ureteral calculous obstruction: Secondary | ICD-10-CM | POA: Diagnosis not present

## 2021-01-05 DIAGNOSIS — R609 Edema, unspecified: Secondary | ICD-10-CM | POA: Diagnosis not present

## 2021-01-05 DIAGNOSIS — M1710 Unilateral primary osteoarthritis, unspecified knee: Secondary | ICD-10-CM | POA: Diagnosis not present

## 2021-01-05 DIAGNOSIS — R252 Cramp and spasm: Secondary | ICD-10-CM | POA: Diagnosis not present

## 2021-01-05 DIAGNOSIS — I10 Essential (primary) hypertension: Secondary | ICD-10-CM | POA: Diagnosis not present

## 2021-01-05 DIAGNOSIS — M545 Low back pain, unspecified: Secondary | ICD-10-CM | POA: Diagnosis not present

## 2021-01-08 ENCOUNTER — Ambulatory Visit: Payer: Medicare PPO | Admitting: Interventional Cardiology

## 2021-01-11 DIAGNOSIS — N19 Unspecified kidney failure: Secondary | ICD-10-CM | POA: Diagnosis not present

## 2021-01-15 NOTE — Progress Notes (Deleted)
Cardiology Office Note   Date:  01/15/2021   ID:  Cassidy Austin, DOB 11-21-1950, MRN [id number]  PCP:  Laurann Montana, MD    No chief complaint on file.    Wt Readings from Last 3 Encounters:  12/29/20 295 lb 6.7 oz (134 kg)  12/11/20 297 lb 6.4 oz (134.9 kg)  11/25/17 297 lb 12.8 oz (135.1 kg)       History of Present Illness: Cassidy Austin is a 70 y.o. female  has had SHOB/DOE over the last 3-4 years.  It has gotten worse with weight gain over the past few years.  She wants to start Silver Sneakers to help get the weight off.  SH ehas gained 40 lbs in the past few years.  DOE starts after 15 minutes of walking.  WOrse with going up a hill.  No chest pain in the past.  Not consistently walking at this time.  Hoping to get back into that.   She had a treadmill test many years ago and that result was ok.    No bleeding problems recently, non after vaginal polyps removed.    Mother had CHF.    No one in the immediate family with stents placed.    No smoking.    I saw her in 2019 and found that she was deconditioned. We encouraged exercise and Crestor for hyperlipidemia:    Past Medical History:  Diagnosis Date  . Complication of anesthesia    hard to wake up after eye surgery in 1970s  . Goiter    with thyroid nodule, FNA 12/15 benign follicular nodule  . Hypercholesterolemia   . Hypertension   . Hypothyroidism   . OA (osteoarthritis) of knee    mod.  Marland Kitchen Spondylolisthesis of lumbar region     Past Surgical History:  Procedure Laterality Date  . COLONOSCOPY    . EYE SURGERY    . THYROID SURGERY  1999     Current Outpatient Medications  Medication Sig Dispense Refill  . aspirin 81 MG chewable tablet Chew 81 mg by mouth every morning.    . cephALEXin (KEFLEX) 500 MG capsule Take 1 capsule (500 mg total) by mouth every 8 (eight) hours.    . cholecalciferol (VITAMIN D) 1000 UNITS tablet Take 1,000 Units by mouth 2 (two) times daily.    Marland Kitchen  ezetimibe (ZETIA) 10 MG tablet Take 10 mg by mouth daily.    Marland Kitchen gabapentin (NEURONTIN) 100 MG capsule Take 1 capsule (100 mg total) by mouth 3 (three) times daily. 21 capsule 0  . levothyroxine (SYNTHROID, LEVOTHROID) 100 MCG tablet Take 100 mcg by mouth daily at 6 (six) AM.    . lidocaine (LIDODERM) 5 % Place 1 patch onto the skin daily. Remove & Discard patch within 12 hours or as directed by MD 30 patch 0  . Magnesium 250 MG TABS Take 500 mg by mouth every morning.    Marland Kitchen oxyCODONE (ROXICODONE) 5 MG immediate release tablet Take 1 tablet (5 mg total) by mouth every 6 (six) hours as needed for severe pain. 15 tablet 0  . tamsulosin (FLOMAX) 0.4 MG CAPS capsule Take 1 capsule (0.4 mg total) by mouth daily after breakfast. 30 capsule   . tetrahydrozoline 0.05 % ophthalmic solution Apply 1 drop to eye as needed. Visine for dry eyes    . tiZANidine (ZANAFLEX) 2 MG tablet Take 1 tablet (2 mg total) by mouth every 8 (eight) hours as needed for muscle spasms. 21 tablet 0  .  TURMERIC PO Take 2 tablets by mouth daily.    . vitamin B-12 (CYANOCOBALAMIN) 1000 MCG tablet Take 2,000 mcg by mouth every morning.    . vitamin C (ASCORBIC ACID) 500 MG tablet Take 500 mg by mouth daily.     No current facility-administered medications for this visit.    Allergies:   Patient has no known allergies.    Social History:  The patient  reports that she has never smoked. She has never used smokeless tobacco. She reports that she does not drink alcohol and does not use drugs.   Family History:  The patient's ***family history includes Heart failure in her mother.    ROS:  Please see the history of present illness.   Otherwise, review of systems are positive for ***.   All other systems are reviewed and negative.    PHYSICAL EXAM: VS:  There were no vitals taken for this visit. , BMI There is no height or weight on file to calculate BMI. GEN: Well nourished, well developed, in no acute distress  HEENT: normal   Neck: no JVD, carotid bruits, or masses Cardiac: ***RRR; no murmurs, rubs, or gallops,no edema  Respiratory:  clear to auscultation bilaterally, normal work of breathing GI: soft, nontender, nondistended, + BS MS: no deformity or atrophy  Skin: warm and dry, no rash Neuro:  Strength and sensation are intact Psych: euthymic mood, full affect   EKG:   The ekg ordered today demonstrates ***   Recent Labs: 12/05/2020: TSH 2.088 12/13/2020: ALT 50; BUN 19; Creatinine, Ser 1.33; Hemoglobin 8.5; Magnesium 2.1; Platelets 336; Potassium 5.1; Sodium 137   Lipid Panel    Component Value Date/Time   CHOL 171 12/05/2020 0424   TRIG 205 (H) 12/05/2020 0424   HDL 14 (L) 12/05/2020 0424   CHOLHDL 12.2 12/05/2020 0424   VLDL 41 (H) 12/05/2020 0424   LDLCALC 116 (H) 12/05/2020 0424     Other studies Reviewed: Additional studies/ records that were reviewed today with results demonstrating: ***.   ASSESSMENT AND PLAN:  1. DOE: 2. Obesity: 3. Hyperlipidemia: 4. HTN:   Current medicines are reviewed at length with the patient today.  The patient concerns regarding her medicines were addressed.  The following changes have been made:  No change***  Labs/ tests ordered today include: *** No orders of the defined types were placed in this encounter.   Recommend 150 minutes/week of aerobic exercise Low fat, low carb, high fiber diet recommended  Disposition:   FU in ***   Signed, Lance Muss, MD  01/15/2021 1:19 PM    [facility] [address] [place], [place], [place]  [address] Phone: [phone]; Fax: [phone]

## 2021-01-16 ENCOUNTER — Ambulatory Visit: Payer: Medicare PPO | Admitting: Interventional Cardiology

## 2021-01-18 DIAGNOSIS — R279 Unspecified lack of coordination: Secondary | ICD-10-CM | POA: Diagnosis not present

## 2021-01-18 DIAGNOSIS — Z6841 Body Mass Index (BMI) 40.0 and over, adult: Secondary | ICD-10-CM | POA: Diagnosis not present

## 2021-01-18 DIAGNOSIS — Z743 Need for continuous supervision: Secondary | ICD-10-CM | POA: Diagnosis not present

## 2021-01-18 DIAGNOSIS — M2550 Pain in unspecified joint: Secondary | ICD-10-CM | POA: Diagnosis not present

## 2021-01-18 DIAGNOSIS — Z7401 Bed confinement status: Secondary | ICD-10-CM | POA: Diagnosis not present

## 2021-01-18 DIAGNOSIS — R531 Weakness: Secondary | ICD-10-CM | POA: Diagnosis not present

## 2021-01-18 DIAGNOSIS — M549 Dorsalgia, unspecified: Secondary | ICD-10-CM | POA: Diagnosis not present

## 2021-01-18 DIAGNOSIS — R52 Pain, unspecified: Secondary | ICD-10-CM | POA: Diagnosis not present

## 2021-01-18 DIAGNOSIS — M5416 Radiculopathy, lumbar region: Secondary | ICD-10-CM | POA: Diagnosis not present

## 2021-01-18 DIAGNOSIS — I10 Essential (primary) hypertension: Secondary | ICD-10-CM | POA: Diagnosis not present

## 2021-01-22 DIAGNOSIS — R252 Cramp and spasm: Secondary | ICD-10-CM | POA: Diagnosis not present

## 2021-01-22 DIAGNOSIS — R609 Edema, unspecified: Secondary | ICD-10-CM | POA: Diagnosis not present

## 2021-01-22 DIAGNOSIS — M545 Low back pain, unspecified: Secondary | ICD-10-CM | POA: Diagnosis not present

## 2021-01-24 ENCOUNTER — Other Ambulatory Visit (HOSPITAL_COMMUNITY): Payer: Self-pay | Admitting: Neurological Surgery

## 2021-01-24 ENCOUNTER — Other Ambulatory Visit: Payer: Self-pay | Admitting: Neurological Surgery

## 2021-01-24 DIAGNOSIS — M5416 Radiculopathy, lumbar region: Secondary | ICD-10-CM

## 2021-01-26 ENCOUNTER — Other Ambulatory Visit: Payer: Self-pay

## 2021-01-26 ENCOUNTER — Ambulatory Visit (HOSPITAL_COMMUNITY)
Admission: RE | Admit: 2021-01-26 | Discharge: 2021-01-26 | Disposition: A | Discharge status: Home / Self Care | Payer: Medicare PPO | Source: Ambulatory Visit | Attending: Neurological Surgery | Admitting: Neurological Surgery

## 2021-01-26 DIAGNOSIS — R5381 Other malaise: Secondary | ICD-10-CM | POA: Diagnosis not present

## 2021-01-26 DIAGNOSIS — M4646 Discitis, unspecified, lumbar region: Secondary | ICD-10-CM | POA: Diagnosis not present

## 2021-01-26 DIAGNOSIS — M2550 Pain in unspecified joint: Secondary | ICD-10-CM | POA: Diagnosis not present

## 2021-01-26 DIAGNOSIS — M5416 Radiculopathy, lumbar region: Secondary | ICD-10-CM | POA: Insufficient documentation

## 2021-01-26 DIAGNOSIS — R0902 Hypoxemia: Secondary | ICD-10-CM | POA: Diagnosis not present

## 2021-01-26 DIAGNOSIS — M48061 Spinal stenosis, lumbar region without neurogenic claudication: Secondary | ICD-10-CM | POA: Diagnosis not present

## 2021-01-26 DIAGNOSIS — M4626 Osteomyelitis of vertebra, lumbar region: Secondary | ICD-10-CM | POA: Diagnosis not present

## 2021-01-26 DIAGNOSIS — M4636 Infection of intervertebral disc (pyogenic), lumbar region: Secondary | ICD-10-CM | POA: Diagnosis not present

## 2021-01-26 DIAGNOSIS — Z7401 Bed confinement status: Secondary | ICD-10-CM | POA: Diagnosis not present

## 2021-01-26 DIAGNOSIS — M5116 Intervertebral disc disorders with radiculopathy, lumbar region: Secondary | ICD-10-CM | POA: Diagnosis not present

## 2021-01-26 DIAGNOSIS — R531 Weakness: Secondary | ICD-10-CM | POA: Diagnosis not present

## 2021-01-27 ENCOUNTER — Inpatient Hospital Stay (HOSPITAL_COMMUNITY)
Admission: EM | Admit: 2021-01-27 | Discharge: 2021-02-07 | DRG: 539 | Disposition: A | Discharge status: Skilled Nursing Facility | Payer: Medicare PPO | Source: Skilled Nursing Facility | Attending: Internal Medicine | Admitting: Internal Medicine

## 2021-01-27 ENCOUNTER — Other Ambulatory Visit: Payer: Self-pay

## 2021-01-27 ENCOUNTER — Encounter (HOSPITAL_COMMUNITY): Payer: Self-pay | Admitting: Emergency Medicine

## 2021-01-27 DIAGNOSIS — Z79899 Other long term (current) drug therapy: Secondary | ICD-10-CM | POA: Diagnosis not present

## 2021-01-27 DIAGNOSIS — I10 Essential (primary) hypertension: Secondary | ICD-10-CM | POA: Diagnosis present

## 2021-01-27 DIAGNOSIS — R591 Generalized enlarged lymph nodes: Secondary | ICD-10-CM | POA: Diagnosis present

## 2021-01-27 DIAGNOSIS — Z79890 Hormone replacement therapy (postmenopausal): Secondary | ICD-10-CM | POA: Diagnosis not present

## 2021-01-27 DIAGNOSIS — M4636 Infection of intervertebral disc (pyogenic), lumbar region: Principal | ICD-10-CM | POA: Diagnosis present

## 2021-01-27 DIAGNOSIS — M869 Osteomyelitis, unspecified: Secondary | ICD-10-CM | POA: Diagnosis not present

## 2021-01-27 DIAGNOSIS — M549 Dorsalgia, unspecified: Secondary | ICD-10-CM | POA: Diagnosis not present

## 2021-01-27 DIAGNOSIS — Z87442 Personal history of urinary calculi: Secondary | ICD-10-CM

## 2021-01-27 DIAGNOSIS — R600 Localized edema: Secondary | ICD-10-CM | POA: Diagnosis not present

## 2021-01-27 DIAGNOSIS — Z6841 Body Mass Index (BMI) 40.0 and over, adult: Secondary | ICD-10-CM | POA: Diagnosis not present

## 2021-01-27 DIAGNOSIS — M4626 Osteomyelitis of vertebra, lumbar region: Secondary | ICD-10-CM | POA: Diagnosis present

## 2021-01-27 DIAGNOSIS — N201 Calculus of ureter: Secondary | ICD-10-CM | POA: Diagnosis not present

## 2021-01-27 DIAGNOSIS — M4646 Discitis, unspecified, lumbar region: Secondary | ICD-10-CM | POA: Diagnosis not present

## 2021-01-27 DIAGNOSIS — D649 Anemia, unspecified: Secondary | ICD-10-CM | POA: Diagnosis not present

## 2021-01-27 DIAGNOSIS — E43 Unspecified severe protein-calorie malnutrition: Secondary | ICD-10-CM | POA: Diagnosis not present

## 2021-01-27 DIAGNOSIS — N1330 Unspecified hydronephrosis: Secondary | ICD-10-CM

## 2021-01-27 DIAGNOSIS — Z8249 Family history of ischemic heart disease and other diseases of the circulatory system: Secondary | ICD-10-CM | POA: Diagnosis not present

## 2021-01-27 DIAGNOSIS — E876 Hypokalemia: Secondary | ICD-10-CM | POA: Diagnosis present

## 2021-01-27 DIAGNOSIS — E039 Hypothyroidism, unspecified: Secondary | ICD-10-CM | POA: Diagnosis present

## 2021-01-27 DIAGNOSIS — R262 Difficulty in walking, not elsewhere classified: Secondary | ICD-10-CM

## 2021-01-27 DIAGNOSIS — E785 Hyperlipidemia, unspecified: Secondary | ICD-10-CM | POA: Diagnosis present

## 2021-01-27 DIAGNOSIS — K5900 Constipation, unspecified: Secondary | ICD-10-CM | POA: Diagnosis present

## 2021-01-27 DIAGNOSIS — R29898 Other symptoms and signs involving the musculoskeletal system: Secondary | ICD-10-CM | POA: Diagnosis not present

## 2021-01-27 DIAGNOSIS — Z20822 Contact with and (suspected) exposure to covid-19: Secondary | ICD-10-CM | POA: Diagnosis not present

## 2021-01-27 DIAGNOSIS — G8929 Other chronic pain: Secondary | ICD-10-CM | POA: Diagnosis not present

## 2021-01-27 DIAGNOSIS — B9620 Unspecified Escherichia coli [E. coli] as the cause of diseases classified elsewhere: Secondary | ICD-10-CM | POA: Diagnosis present

## 2021-01-27 DIAGNOSIS — Z7982 Long term (current) use of aspirin: Secondary | ICD-10-CM | POA: Diagnosis not present

## 2021-01-27 DIAGNOSIS — M545 Low back pain, unspecified: Secondary | ICD-10-CM | POA: Diagnosis not present

## 2021-01-27 DIAGNOSIS — R52 Pain, unspecified: Secondary | ICD-10-CM

## 2021-01-27 DIAGNOSIS — Z452 Encounter for adjustment and management of vascular access device: Secondary | ICD-10-CM | POA: Diagnosis not present

## 2021-01-27 DIAGNOSIS — E7800 Pure hypercholesterolemia, unspecified: Secondary | ICD-10-CM | POA: Diagnosis not present

## 2021-01-27 LAB — CBC WITH DIFFERENTIAL/PLATELET
Abs Immature Granulocytes: 0.04 10*3/uL (ref 0.00–0.07)
Basophils Absolute: 0 10*3/uL (ref 0.0–0.1)
Basophils Relative: 0 %
Eosinophils Absolute: 1.3 10*3/uL — ABNORMAL HIGH (ref 0.0–0.5)
Eosinophils Relative: 13 %
HCT: 32.4 % — ABNORMAL LOW (ref 36.0–46.0)
Hemoglobin: 10.2 g/dL — ABNORMAL LOW (ref 12.0–15.0)
Immature Granulocytes: 0 %
Lymphocytes Relative: 11 %
Lymphs Abs: 1.2 10*3/uL (ref 0.7–4.0)
MCH: 27.4 pg (ref 26.0–34.0)
MCHC: 31.5 g/dL (ref 30.0–36.0)
MCV: 87.1 fL (ref 80.0–100.0)
Monocytes Absolute: 0.7 10*3/uL (ref 0.1–1.0)
Monocytes Relative: 6 %
Neutro Abs: 6.9 10*3/uL (ref 1.7–7.7)
Neutrophils Relative %: 70 %
Platelets: 344 10*3/uL (ref 150–400)
RBC: 3.72 MIL/uL — ABNORMAL LOW (ref 3.87–5.11)
RDW: 16.2 % — ABNORMAL HIGH (ref 11.5–15.5)
WBC: 10.1 10*3/uL (ref 4.0–10.5)
nRBC: 0 % (ref 0.0–0.2)

## 2021-01-27 LAB — LACTIC ACID, PLASMA: Lactic Acid, Venous: 1.1 mmol/L (ref 0.5–1.9)

## 2021-01-27 LAB — COMPREHENSIVE METABOLIC PANEL
ALT: 10 U/L (ref 0–44)
AST: 18 U/L (ref 15–41)
Albumin: 3.1 g/dL — ABNORMAL LOW (ref 3.5–5.0)
Alkaline Phosphatase: 80 U/L (ref 38–126)
Anion gap: 8 (ref 5–15)
BUN: 14 mg/dL (ref 8–23)
CO2: 30 mmol/L (ref 22–32)
Calcium: 9 mg/dL (ref 8.9–10.3)
Chloride: 99 mmol/L (ref 98–111)
Creatinine, Ser: 0.88 mg/dL (ref 0.44–1.00)
GFR, Estimated: >60 mL/min (ref >60)
Glucose, Bld: 109 mg/dL — ABNORMAL HIGH (ref 70–99)
Potassium: 3.4 mmol/L — ABNORMAL LOW (ref 3.5–5.1)
Sodium: 137 mmol/L (ref 135–145)
Total Bilirubin: 0.8 mg/dL (ref 0.3–1.2)
Total Protein: 8.1 g/dL (ref 6.5–8.1)

## 2021-01-27 LAB — RESP PANEL BY RT-PCR (FLU A&B, COVID) ARPGX2
Influenza A by PCR: NEGATIVE
Influenza B by PCR: NEGATIVE
SARS Coronavirus 2 by RT PCR: NEGATIVE

## 2021-01-27 MED ORDER — MORPHINE SULFATE (PF) 2 MG/ML IV SOLN
2.0000 mg | INTRAVENOUS | Status: DC | PRN
  Administered 2021-01-27: 2 mg via INTRAVENOUS
  Filled 2021-01-27: qty 1

## 2021-01-27 MED ORDER — ACETAMINOPHEN 650 MG RE SUPP: 650.0000 mg | Freq: Four times a day (QID) | RECTAL | Status: DC | PRN

## 2021-01-27 MED ORDER — ONDANSETRON HCL 4 MG PO TABS: 4.0000 mg | ORAL_TABLET | Freq: Four times a day (QID) | ORAL | Status: DC | PRN

## 2021-01-27 MED ORDER — METHOCARBAMOL 1000 MG/10ML IJ SOLN
500.0000 mg | Freq: Four times a day (QID) | INTRAVENOUS | Status: DC | PRN
  Administered 2021-01-29 (×2): 500 mg via INTRAVENOUS
  Filled 2021-01-27 (×3): qty 5

## 2021-01-27 MED ORDER — METHOCARBAMOL 1000 MG/10ML IJ SOLN
500.0000 mg | Freq: Once | INTRAVENOUS | Status: AC
  Administered 2021-01-27: 500 mg via INTRAVENOUS
  Filled 2021-01-27: qty 5

## 2021-01-27 MED ORDER — ONDANSETRON HCL 4 MG/2ML IJ SOLN: 4.0000 mg | Freq: Four times a day (QID) | INTRAMUSCULAR | Status: DC | PRN

## 2021-01-27 MED ORDER — SODIUM CHLORIDE 0.9 % IV SOLN: INTRAVENOUS | Status: DC

## 2021-01-27 MED ORDER — ACETAMINOPHEN 325 MG PO TABS: 650.0000 mg | ORAL_TABLET | Freq: Four times a day (QID) | ORAL | Status: DC | PRN

## 2021-01-27 NOTE — Consult Note (Signed)
Reason for Consult:Discitis/osteomyelitis at L1-L2  Referring Physician: Cristina Gong, PA-C   HPI: Cassidy Austin is an 70 y.o. female who has a PmHx significant for hypothyroidism, HTN, and HLD, presents to the ED for evaluation of bilateral low back pain that radiates into her right buttock and into her right posterior thigh. She also reports severe muscle spasmasd that effect her posterior right thigh. The patient stated that her pain started on February 1st and had difficulty with ambulation due to the pain. She initially presented to the ED for evaluation after she noticed hematuria. She was diagnosed with a UTI and thought her back pain was due to an acute muscle sprain. She was discharged from th ED with antibiotics and sent to [facility] for rehabilitation of her low back pain. Her symptoms continued to persist and worsen. She then sought neurosurgical evaluation. She was subsequently sent for a lumbar MRI. [facility] radiology notified the on call provider for neurosurgery of the results of her MRI. The patient was then contacted and instructed to come into the ED. She denies numbness and tingling, including saddle anesthesia or bowel or bladder abnormalities.  Past Medical History:  Diagnosis Date  . Complication of anesthesia    hard to wake up after eye surgery in 1970s  . Goiter    with thyroid nodule, FNA 12/15 benign follicular nodule  . Hypercholesterolemia   . Hypertension   . Hypothyroidism   . OA (osteoarthritis) of knee    mod.  Marland Kitchen Spondylolisthesis of lumbar region     Past Surgical History:  Procedure Laterality Date  . COLONOSCOPY    . EYE SURGERY    . THYROID SURGERY  1999    Family History  Problem Relation Age of Onset  . Heart failure Mother   . Breast cancer Neg Hx     Social History:  reports that she has never smoked. She has never used smokeless tobacco. She reports that she does not drink alcohol and does not use drugs.  Allergies: No  Known Allergies  Medications: I have reviewed the patient's current medications.  No results found for this or any previous visit (from the past 48 hour(s)).  MR LUMBAR SPINE WO CONTRAST  Result Date: 01/27/2021 CLINICAL DATA:  Lumbar radiculopathy; fall in January EXAM: MRI LUMBAR SPINE WITHOUT CONTRAST TECHNIQUE: Multiplanar, multisequence MR imaging of the lumbar spine was performed. No intravenous contrast was administered. COMPARISON:  Correlation made with CT lumbar spine 12/11/2020 FINDINGS: Segmentation:  Standard. Alignment:  Mild anterolisthesis at L3-L4 and L4-L5. Vertebrae: There is new irregularity and erosion at the L1-L2 opposing endplates with abnormal marrow signal. Vertebral body heights are otherwise maintained. Background diffusely decreased T1 marrow signal may reflect anemia. Conus medullaris and cauda equina: Conus extends to the approximately L1 level. Cauda equina unremarkable apart from level of stenosis. Paraspinal and other soft tissues: Paraspinal edema centered at L1 and L2 with involvement of the psoas muscles. Presence of intradiscal abscess extending into the right psoas is not excluded. Disc levels: T12-L1: Mild facet arthropathy.  No stenosis. L1-L2: T2 hyperintense soft tissue extends from the disc into the ventral epidural space and slightly below disc level. Moderate facet arthropathy with ligamentum flavum infolding. Marked canal stenosis. Moderate to marked foraminal stenosis. L2-L3: Disc bulge. Moderate facet arthropathy with ligamentum flavum infolding. Minor canal stenosis. Minor right and mild left foraminal stenosis. L3-L4: Anterolisthesis with uncovering of disc bulge. Marked facet arthropathy with ligamentum flavum infolding. Moderate to marked canal stenosis. Mild to  moderate foraminal stenosis. L4-L5: Anterolisthesis with uncovering of disc bulge. Marked facet arthropathy with ligamentum flavum infolding. Moderate canal stenosis. Mild foraminal stenosis.  L5-S1: Marked right and mild left facet arthropathy. No canal or right foraminal stenosis. Mild left foraminal stenosis. 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 Intradiscal abscess extending into the right psoas is not excluded. 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 Multilevel degenerative changes at other levels. Degenerative canal narrowing greatest at L3-L4. These results will be called to the ordering clinician or representative by the Radiologist Assistant, and communication documented in the PACS or [employer]. Electronically Signed   By: Guadlupe Spanish M.D.   On: 01/27/2021 11:56    Review of Systems  Constitutional: Positive for activity change. Negative for appetite change, chills, diaphoresis and fever.  HENT: Negative.   Eyes: Negative.   Respiratory: Negative.   Cardiovascular: Negative.   Gastrointestinal: Negative.   Endocrine: Negative.   Genitourinary: Negative.   Musculoskeletal: Positive for back pain. Negative for neck pain and neck stiffness.       B/L low back pain that radiates into her right buttock and into her right posterior thigh  Skin: Negative.   Neurological: Positive for weakness. Negative for dizziness, light-headedness, numbness and headaches.       She reports subjective weakness secondary to severe pain  Psychiatric/Behavioral: Negative.    Blood pressure 133/61, pulse 65, temperature 99.1 F (37.3 C), resp. rate 16, SpO2 97 %. Physical Exam Constitutional:      Appearance: Normal appearance. She is obese.  HENT:     Head: Normocephalic and atraumatic.     Nose: Nose normal.     Mouth/Throat:     Mouth: Mucous membranes are dry.  Eyes:     Extraocular Movements: Extraocular movements intact.     Pupils: Pupils are equal, round, and reactive to light.  Cardiovascular:     Rate and Rhythm: Normal rate and regular rhythm.  Pulmonary:     Effort:  Pulmonary effort is normal.     Breath sounds: Normal breath sounds.  Abdominal:     General: Bowel sounds are normal.     Palpations: Abdomen is soft.  Musculoskeletal:        General: Normal range of motion.     Cervical back: Normal range of motion.  Skin:    General: Skin is warm and dry.  Neurological:     General: No focal deficit present.     Mental Status: She is alert and oriented to person, place, and time. Mental status is at baseline.     Cranial Nerves: No cranial nerve deficit.     Sensory: No sensory deficit.     Motor: No weakness.     Deep Tendon Reflexes: Reflexes normal.  Psychiatric:        Mood and Affect: Mood normal.        Behavior: Behavior normal.        Thought Content: Thought content normal.        Judgment: Judgment normal.   Negative laying straight leg raise and Patrick's bilaterally.   Assessment/Plan: 70 year old female with severe and intractable low back pain that radiates into her bilateral buttocks and into her bilateral posterior thighs. Recent MRI imaging revealed discitis/osteomyelitis at L2. She has severe low back pain.There is no abscess appreciated on her imaging. She has full strength on confrontational testing with intact sensation.  Admit to medicine team. ID consult. Patient  will need disc space aspiration with IR. Hold all antibiotics until disc aspiration has been obtained. Once aspiration has been obtained, ok to proceed with broad spectrum antibiotics per ID. Discussed case with IR MD. Plan for disc aspiration in the morning. IR to place order. Continue NPO status.    Council Mechanic, DNP, NP-C 01/27/2021, 5:13 PM

## 2021-01-27 NOTE — ED Provider Notes (Signed)
  Face-to-face evaluation   History: Patient being monitored for back pain by neurosurgery, presenting now with abnormal MRI results showing discitis.  She has progressive low back pain.  Following hospital discharge and February, she was sent to a nursing home for rehabilitation.  I saw the patient at 5:50 PM.  She states that this time her pain is 8/10, and that she needs pain medication.  She is currently taking daily narcotic medication with a MME of only 8.  Narcotic databank reviewed.  Patient denies fever, chills, nausea or vomiting.  She is having trouble ambulating because of pain.  Physical exam: Alert, calm, uncomfortable.  No respiratory distress.  No dysarthria or aphasia.  She is lucid.  Medical screening examination/treatment/procedure(s) were conducted as a shared visit with non-physician practitioner(s) and myself.  I personally evaluated the patient during the encounter    Mancel Bale, MD 01/27/21 [phone]

## 2021-01-27 NOTE — ED Triage Notes (Signed)
EMS stated, she has back pain, She has discitis, infection in her back. She has back spasms to radiate to her knees. MRI yesterday that shows infection in lower spine

## 2021-01-27 NOTE — ED Notes (Signed)
RN attempted to call report x1 

## 2021-01-27 NOTE — ED Notes (Signed)
Two unsuccessful IV attempts. Able to obtain blood cultures and labs, however IV would not thread.

## 2021-01-27 NOTE — ED Provider Notes (Signed)
[facility] EMERGENCY DEPARTMENT Provider Note   CSN: [id number] Arrival date & time: 01/27/21  1456     History Chief Complaint  Patient presents with  . Back Pain    Cassidy Austin is a 70 y.o. female with a past medical history of hypertension, hypothyroid, high cholesterol, who presents today for evaluation of MRI obtained yesterday by Dr. Johnsie Cancel showing concern for discitis and infection. She states that she had been okay, ambulatory, and fully functional until February of this year when she started having lower back pain after she was moving.  She was admitted on February 1 for NSTEMI, found to have leg swelling, and AKI showing a possibly obstructive urinary stone, sepsis due to E. coli UTI, and back pain that started after the manual work at home.  She had a CT scan done showing chronic degenerative changes per discharge summary. Patient was then discharged to SNF.  She has had ongoing lower back pain since then.  She was seen by Dr. Johnsie Cancel on 01/18/2021. She states that she, until late January early February had not had problems with lower back pain before.    She states today that Dr. Tivis Ringer office called and told her to go to the ER.  She notes that she is unable to walk due to pain and weakness.   No changes to bowel or bladder function.   She was noted to have a pressure ulcer during her admission, however she states that it was on her left hip, not lower back.  HPI     Past Medical History:  Diagnosis Date  . Complication of anesthesia    hard to wake up after eye surgery in 1970s  . Goiter    with thyroid nodule, FNA 12/15 benign follicular nodule  . Hypercholesterolemia   . Hypertension   . Hypothyroidism   . OA (osteoarthritis) of knee    mod.  Marland Kitchen Spondylolisthesis of lumbar region     Patient Active Problem List   Diagnosis Date Noted  . Pressure injury of skin 12/11/2020  . Chest pain 12/05/2020  . Renal insufficiency  12/05/2020  . Normocytic anemia 12/05/2020  . Thrombocytopenia (HCC) 12/05/2020  . Leukocytosis 12/05/2020  . Acute renal insufficiency   . Low back pain   . Hypothyroidism   . Hypertension   . Complication of anesthesia   . OA (osteoarthritis) of knee   . Hypercholesterolemia   . Goiter     Past Surgical History:  Procedure Laterality Date  . COLONOSCOPY    . EYE SURGERY    . THYROID SURGERY  1999     OB History   No obstetric history on file.     Family History  Problem Relation Age of Onset  . Heart failure Mother   . Breast cancer Neg Hx     Social History   Tobacco Use  . Smoking status: Never Smoker  . Smokeless tobacco: Never Used  Vaping Use  . Vaping Use: Never used  Substance Use Topics  . Alcohol use: No  . Drug use: No    Home Medications Prior to Admission medications   Medication Sig Start Date End Date Taking? Authorizing Provider  aspirin 81 MG chewable tablet Chew 81 mg by mouth every morning.    [provider]  cephALEXin (KEFLEX) 500 MG capsule Take 1 capsule (500 mg total) by mouth every 8 (eight) hours. 12/13/20   Joseph Art, DO  cholecalciferol (VITAMIN D) 1000 UNITS tablet  Take 1,000 Units by mouth 2 (two) times daily.    [provider]  ezetimibe (ZETIA) 10 MG tablet Take 10 mg by mouth daily. 11/21/20   [provider]  gabapentin (NEURONTIN) 100 MG capsule Take 1 capsule (100 mg total) by mouth 3 (three) times daily. 12/29/20   Robinson, [place] N, PA-C  levothyroxine (SYNTHROID, LEVOTHROID) 100 MCG tablet Take 100 mcg by mouth daily at 6 (six) AM.    [provider]  lidocaine (LIDODERM) 5 % Place 1 patch onto the skin daily. Remove & Discard patch within 12 hours or as directed by MD 12/13/20   Joseph ArtVann, Jessica U, DO  Magnesium 250 MG TABS Take 500 mg by mouth every morning.    [provider]  oxyCODONE (ROXICODONE) 5 MG immediate release tablet Take 1 tablet (5 mg total) by mouth every 6  (six) hours as needed for severe pain. 12/29/20   Robinson, [place] N, PA-C  tamsulosin (FLOMAX) 0.4 MG CAPS capsule Take 1 capsule (0.4 mg total) by mouth daily after breakfast. 12/13/20   Joseph ArtVann, Jessica U, DO  tetrahydrozoline 0.05 % ophthalmic solution Apply 1 drop to eye as needed. Visine for dry eyes    [provider]  tiZANidine (ZANAFLEX) 2 MG tablet Take 1 tablet (2 mg total) by mouth every 8 (eight) hours as needed for muscle spasms. 12/29/20   Robinson, [place] N, PA-C  Turmeric 500 MG CAPS Take 1 capsule by mouth 2 (two) times daily. 12/13/20   [provider]  vitamin B-12 (CYANOCOBALAMIN) 1000 MCG tablet Take 2,000 mcg by mouth every morning.    [provider]  vitamin C (ASCORBIC ACID) 500 MG tablet Take 500 mg by mouth daily.    [provider]    Allergies    Patient has no known allergies.  Review of Systems   Review of Systems  Constitutional: Negative for chills and fever.  Respiratory: Negative for chest tightness.   Cardiovascular: Negative for chest pain.  Gastrointestinal: Negative for abdominal pain, nausea and vomiting.  Genitourinary: Negative for dysuria.  Musculoskeletal: Positive for back pain.  Neurological: Positive for weakness.  All other systems reviewed and are negative.   Physical Exam Updated Vital Signs BP 133/61 (BP Location: Right Arm)   Pulse 65   Temp 99.1 F (37.3 C)   Resp 16   SpO2 97%   Physical Exam Vitals and nursing note reviewed.  Constitutional:      General: She is not in acute distress.    Appearance: She is not diaphoretic.  HENT:     Head: Normocephalic and atraumatic.  Eyes:     General: No scleral icterus.       Right eye: No discharge.        Left eye: No discharge.     Conjunctiva/sclera: Conjunctivae normal.  Cardiovascular:     Rate and Rhythm: Normal rate and regular rhythm.     Pulses: Normal pulses.     Heart sounds: Normal heart sounds.  Pulmonary:     Effort: Pulmonary  effort is normal. No respiratory distress.     Breath sounds: No stridor.  Abdominal:     General: There is no distension.  Musculoskeletal:        General: No deformity.     Cervical back: Normal range of motion.     Right lower leg: No edema.     Left lower leg: No edema.     Comments: 5/5 strength bilaterally ankle dorsiflexion,  plantar flexion and grip strength.  She is unable to lift her legs off the bed due to pain in her lower back.  Skin:    General: Skin is warm and dry.     Comments: Dry flakey skin on bilateral feet.  Skin over lower back is intact without obvious superficial infection.  Neurological:     Mental Status: She is alert.     Motor: No abnormal muscle tone.  Psychiatric:        Behavior: Behavior normal.     ED Results / Procedures / Treatments   Labs (all labs ordered are listed, but only abnormal results are displayed) Labs Reviewed  COMPREHENSIVE METABOLIC PANEL - Abnormal; Notable for the following components:      Result Value   Potassium 3.4 (*)    Glucose, Bld 109 (*)    Albumin 3.1 (*)    All other components within normal limits  CBC WITH DIFFERENTIAL/PLATELET - Abnormal; Notable for the following components:   RBC 3.72 (*)    Hemoglobin 10.2 (*)    HCT 32.4 (*)    RDW 16.2 (*)    Eosinophils Absolute 1.3 (*)    All other components within normal limits  RESP PANEL BY RT-PCR (FLU A&B, COVID) ARPGX2  CULTURE, BLOOD (ROUTINE X 2)  CULTURE, BLOOD (ROUTINE X 2)  URINE CULTURE  LACTIC ACID, PLASMA  URINALYSIS, ROUTINE W REFLEX MICROSCOPIC    EKG None  Radiology- obtained PTA MR LUMBAR SPINE WO CONTRAST  Result Date: 01/27/2021 CLINICAL DATA:  Lumbar radiculopathy; fall in January EXAM: MRI LUMBAR SPINE WITHOUT CONTRAST TECHNIQUE: Multiplanar, multisequence MR imaging of the lumbar spine was performed. No intravenous contrast was administered. COMPARISON:  Correlation made with CT lumbar spine 12/11/2020 FINDINGS: Segmentation:  Standard.  Alignment:  Mild anterolisthesis at L3-L4 and L4-L5. Vertebrae: There is new irregularity and erosion at the L1-L2 opposing endplates with abnormal marrow signal. Vertebral body heights are otherwise maintained. Background diffusely decreased T1 marrow signal may reflect anemia. Conus medullaris and cauda equina: Conus extends to the approximately L1 level. Cauda equina unremarkable apart from level of stenosis. Paraspinal and other soft tissues: Paraspinal edema centered at L1 and L2 with involvement of the psoas muscles. Presence of intradiscal abscess extending into the right psoas is not excluded. Disc levels: T12-L1: Mild facet arthropathy.  No stenosis. L1-L2: T2 hyperintense soft tissue extends from the disc into the ventral epidural space and slightly below disc level. Moderate facet arthropathy with ligamentum flavum infolding. Marked canal stenosis. Moderate to marked foraminal stenosis. L2-L3: Disc bulge. Moderate facet arthropathy with ligamentum flavum infolding. Minor canal stenosis. Minor right and mild left foraminal stenosis. L3-L4: Anterolisthesis with uncovering of disc bulge. Marked facet arthropathy with ligamentum flavum infolding. Moderate to marked canal stenosis. Mild to moderate foraminal stenosis. L4-L5: Anterolisthesis with uncovering of disc bulge. Marked facet arthropathy with ligamentum flavum infolding. Moderate canal stenosis. Mild foraminal stenosis. L5-S1: Marked right and mild left facet arthropathy. No canal or right foraminal stenosis. Mild left foraminal stenosis. 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 Intradiscal abscess extending into the right psoas is not excluded. 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 Multilevel degenerative changes at other levels. Degenerative canal narrowing greatest at L3-L4. These results will be called to the ordering clinician or representative by the  Radiologist Assistant, and communication documented in the PACS or [employer]. Electronically Signed   By: Guadlupe Spanish M.D.   On: 01/27/2021  11:56    Procedures Procedures  No critical care time.   Medications Ordered in ED Medications - No data to display  ED Course  I have reviewed the triage vital signs and the nursing notes.  Pertinent labs & imaging results that were available during my care of the patient were reviewed by me and considered in my medical decision making (see chart for details).  Clinical Course as of 01/27/21 2339  Sat Jan 27, 2021  2014 I spoke with hospitalist who will see patient for admission.  Discussed, per neurosurgery, holding off on antibiotics as patient is septic given need to obtain fluid sample, that IR will place orders and aspirate for culture and will need ID consult.  [EH]    Clinical Course User Index [EH] Norman Clay   MDM Rules/Calculators/A&P                         Patient is a 70 year old woman who presents today for evaluation of 2 to 3 months of ongoing lower back pain that started after she was doing manual work at home preparing to move.  She then was admitted for E. coli sepsis, and the back pain has not resolved.  She saw Dr. Johnsie Cancel and had an MRI yesterday showing concern for discitis and osteomyelitis at L1, L2 with paraspinal edema, abscess possibly extending into the right psoas muscle and ventral epidural extension resulting in marked canal stenosis with facet degeneration.  She has had a significant functional decline over the past 2 months and went from being ambulatory to essentially bedbound due to her low back pain.  Basic labs are ordered, neurosurgery is consulted.  Her Covid test is negative.  CBC shows mild anemia however no leukocytosis.  CMP is unremarkable.  Lactic acid is not elevated.  Neurosurgery recommends holding off on antibiotics.  This is reasonable given that patient is not  septic. This is to allow for IR aspiration, possibly tomorrow for obtaining samples before starting antibiotics.  Additionally neurosurgery reports patient will need a ID consult. This information is passed along to admitting team. After discussion with neurosurgery IV Robaxin was ordered to try and help with her pain and spasms.  I spoke with hospitalist who will see the patient for admission.  The patient appears reasonably stabilized for admission considering the current resources, flow, and capabilities available in the ED at this time, and I doubt any other [facility] requiring further screening and/or treatment in the ED prior to admission assuming timely admission and bed placement.  Note: Portions of this report may have been transcribed using voice recognition software. Every effort was made to ensure accuracy; however, inadvertent computerized transcription errors may be present  Final Clinical Impression(s) / ED Diagnoses Final diagnoses:  None    Rx / DC Orders ED Discharge Orders    None       Norman Clay 01/27/21 2342    Mancel Bale, MD 01/28/21 2047

## 2021-01-27 NOTE — H&P (Signed)
History and Physical    Cassidy DavidsonBeverly Hartney OZH:[id number] DOB: 02-11-1951 DOA: 01/27/2021  PCP: Laurann MontanaWhite, Cynthia, MD   Patient coming from: Home  I have personally briefly reviewed patient's old medical records in [facility]  Chief Complaint: Back pain  HPI: Cassidy Austin is a 70 y.o. female with medical history significant for hypertension and hypothyroidism as well intractable low back pain that started around February worsening to where she had difficulty ambulating.  She was hospitalized later that month and found to have sepsis secondary to pyelonephritis.  She was discharged to rehab following hospitalization but continues to have progressive pain in spite of outpatient pain medication and muscle relaxants.  Pain radiates down  To buttocks and right thigh. Denies bowel or bladder incontinence or retention.  Denies numbness in the area of the groin.  She is unable to ambulate.  Had an outpatient MRI lumbar spine on 3/25 that showed discitis and osteomyelitis at S2 cannot rule out intradiscal abscess.  Patient denies fever or chills.   ED Course: On arrival, temperature 99.1, BP 133/61, pulse 65 with O2 sat 97% on room air.  Blood work significant for WBC 10,000, hemoglobin 10.2.  CMP unremarkable.  COVID and flu negative Imaging: GEX:[id number]:Evidence of discitis/osteomyelitis at L1-L2 with paraspinal edema. Intradiscal abscess extending into the right psoas is not excluded. 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  Patient was evaluated by neurosurgery in the emergency room.  He would like to have IR do disc aspiration and hold all antibiotics until following aspiration.  IV Robaxin and IV pain meds for pain.  Also recommending ID consult.  Review of Systems: As per HPI otherwise all other systems on review of systems negative.    Past Medical History:  Diagnosis Date  . Complication of anesthesia    hard to wake up after eye  surgery in 1970s  . Goiter    with thyroid nodule, FNA 12/15 benign follicular nodule  . Hypercholesterolemia   . Hypertension   . Hypothyroidism   . OA (osteoarthritis) of knee    mod.  Marland Kitchen. Spondylolisthesis of lumbar region     Past Surgical History:  Procedure Laterality Date  . COLONOSCOPY    . EYE SURGERY    . THYROID SURGERY  1999     reports that she has never smoked. She has never used smokeless tobacco. She reports that she does not drink alcohol and does not use drugs.  No Known Allergies  Family History  Problem Relation Age of Onset  . Heart failure Mother   . Breast cancer Neg Hx       Prior to Admission medications   Medication Sig Start Date End Date Taking? Authorizing Provider  aspirin 81 MG chewable tablet Chew 81 mg by mouth every morning.    [provider]  cephALEXin (KEFLEX) 500 MG capsule Take 1 capsule (500 mg total) by mouth every 8 (eight) hours. 12/13/20   Joseph ArtVann, Jessica U, DO  cholecalciferol (VITAMIN D) 1000 UNITS tablet Take 1,000 Units by mouth 2 (two) times daily.    [provider]  ezetimibe (ZETIA) 10 MG tablet Take 10 mg by mouth daily. 11/21/20   [provider]  gabapentin (NEURONTIN) 100 MG capsule Take 1 capsule (100 mg total) by mouth 3 (three) times daily. 12/29/20   Robinson, [place] N, PA-C  levothyroxine (SYNTHROID, LEVOTHROID) 100 MCG tablet Take 100 mcg by mouth daily at 6 (six) AM.  [provider]  lidocaine (LIDODERM) 5 % Place 1 patch onto the skin daily. Remove & Discard patch within 12 hours or as directed by MD 12/13/20   Joseph Art, DO  Magnesium 250 MG TABS Take 500 mg by mouth every morning.    [provider]  oxyCODONE (ROXICODONE) 5 MG immediate release tablet Take 1 tablet (5 mg total) by mouth every 6 (six) hours as needed for severe pain. 12/29/20   Robinson, [place] N, PA-C  tamsulosin (FLOMAX) 0.4 MG CAPS capsule Take 1 capsule (0.4 mg total) by mouth daily after  breakfast. 12/13/20   Joseph Art, DO  tetrahydrozoline 0.05 % ophthalmic solution Apply 1 drop to eye as needed. Visine for dry eyes    [provider]  tiZANidine (ZANAFLEX) 2 MG tablet Take 1 tablet (2 mg total) by mouth every 8 (eight) hours as needed for muscle spasms. 12/29/20   Robinson, [place] N, PA-C  Turmeric 500 MG CAPS Take 1 capsule by mouth 2 (two) times daily. 12/13/20   [provider]  vitamin B-12 (CYANOCOBALAMIN) 1000 MCG tablet Take 2,000 mcg by mouth every morning.    [provider]  vitamin C (ASCORBIC ACID) 500 MG tablet Take 500 mg by mouth daily.    [provider]    Physical Exam: Vitals:   01/27/21 1715 01/27/21 1730 01/27/21 1745 01/27/21 1800  BP: (!) 152/64 (!) 152/72 (!) 153/72 (!) 156/70  Pulse: 65 69 73 79  Resp: 18     Temp:      SpO2: 98% 97% 95% 91%     Vitals:   01/27/21 1715 01/27/21 1730 01/27/21 1745 01/27/21 1800  BP: (!) 152/64 (!) 152/72 (!) 153/72 (!) 156/70  Pulse: 65 69 73 79  Resp: 18     Temp:      SpO2: 98% 97% 95% 91%      Constitutional: Alert and oriented x 3 . Not in any apparent distress HEENT:      Head: Normocephalic and atraumatic.         Eyes: PERLA, EOMI, Conjunctivae are normal. Sclera is non-icteric.       Mouth/Throat: Mucous membranes are moist.       Neck: Supple with no signs of meningismus. Cardiovascular: Regular rate and rhythm. No murmurs, gallops, or rubs. 2+ symmetrical distal pulses are present . No JVD. No LE edema Respiratory: Respiratory effort normal .Lungs sounds clear bilaterally. No wheezes, crackles, or rhonchi.  Gastrointestinal: Soft, non tender, and non distended with positive bowel sounds.  Genitourinary: No CVA tenderness. Musculoskeletal: Nontender with normal range of motion in all extremities. No cyanosis, or erythema of extremities.  Lumbar pain Neurologic:  Face is symmetric. Moving all extremities. No gross focal neurologic deficits . Skin: Skin is  warm, dry.  No rash or ulcers Psychiatric: Mood and affect are normal    Labs on Admission: I have personally reviewed following labs and imaging studies  CBC: Recent Labs  Lab 01/27/21 1833  WBC 10.1  NEUTROABS 6.9  HGB 10.2*  HCT 32.4*  MCV 87.1  PLT 344   Basic Metabolic Panel: Recent Labs  Lab 01/27/21 1833  NA 137  K 3.4*  CL 99  CO2 30  GLUCOSE 109*  BUN 14  CREATININE 0.88  CALCIUM 9.0   GFR: CrCl cannot be calculated (Unknown ideal weight.). Liver Function Tests: Recent Labs  Lab 01/27/21 1833  AST 18  ALT 10  ALKPHOS 80  BILITOT 0.8  PROT 8.1  ALBUMIN 3.1*   No results for input(s): LIPASE, AMYLASE in the last 168 hours. No results for input(s): AMMONIA in the last 168 hours. Coagulation Profile: No results for input(s): INR, PROTIME in the last 168 hours. Cardiac Enzymes: No results for input(s): CKTOTAL, CKMB, CKMBINDEX, TROPONINI in the last 168 hours. BNP (last 3 results) No results for input(s): PROBNP in the last 8760 hours. HbA1C: No results for input(s): HGBA1C in the last 72 hours. CBG: No results for input(s): GLUCAP in the last 168 hours. Lipid Profile: No results for input(s): CHOL, HDL, LDLCALC, TRIG, CHOLHDL, LDLDIRECT in the last 72 hours. Thyroid Function Tests: No results for input(s): TSH, T4TOTAL, FREET4, T3FREE, THYROIDAB in the last 72 hours. Anemia Panel: No results for input(s): VITAMINB12, FOLATE, FERRITIN, TIBC, IRON, RETICCTPCT in the last 72 hours. Urine analysis:    Component Value Date/Time   COLORURINE YELLOW 12/29/2020 1421   APPEARANCEUR CLEAR 12/29/2020 1421   LABSPEC 1.009 12/29/2020 1421   PHURINE 6.0 12/29/2020 1421   GLUCOSEU NEGATIVE 12/29/2020 1421   HGBUR NEGATIVE 12/29/2020 1421   BILIRUBINUR NEGATIVE 12/29/2020 1421   KETONESUR NEGATIVE 12/29/2020 1421   PROTEINUR NEGATIVE 12/29/2020 1421   NITRITE NEGATIVE 12/29/2020 1421   LEUKOCYTESUR LARGE (A) 12/29/2020 1421    Radiological Exams on  Admission: MR LUMBAR SPINE WO CONTRAST  Result Date: 01/27/2021 CLINICAL DATA:  Lumbar radiculopathy; fall in January EXAM: MRI LUMBAR SPINE WITHOUT CONTRAST TECHNIQUE: Multiplanar, multisequence MR imaging of the lumbar spine was performed. No intravenous contrast was administered. COMPARISON:  Correlation made with CT lumbar spine 12/11/2020 FINDINGS: Segmentation:  Standard. Alignment:  Mild anterolisthesis at L3-L4 and L4-L5. Vertebrae: There is new irregularity and erosion at the L1-L2 opposing endplates with abnormal marrow signal. Vertebral body heights are otherwise maintained. Background diffusely decreased T1 marrow signal may reflect anemia. Conus medullaris and cauda equina: Conus extends to the approximately L1 level. Cauda equina unremarkable apart from level of stenosis. Paraspinal and other soft tissues: Paraspinal edema centered at L1 and L2 with involvement of the psoas muscles. Presence of intradiscal abscess extending into the right psoas is not excluded. Disc levels: T12-L1: Mild facet arthropathy.  No stenosis. L1-L2: T2 hyperintense soft tissue extends from the disc into the ventral epidural space and slightly below disc level. Moderate facet arthropathy with ligamentum flavum infolding. Marked canal stenosis. Moderate to marked foraminal stenosis. L2-L3: Disc bulge. Moderate facet arthropathy with ligamentum flavum infolding. Minor canal stenosis. Minor right and mild left foraminal stenosis. L3-L4: Anterolisthesis with uncovering of disc bulge. Marked facet arthropathy with ligamentum flavum infolding. Moderate to marked canal stenosis. Mild to moderate foraminal stenosis. L4-L5: Anterolisthesis with uncovering of disc bulge. Marked facet arthropathy with ligamentum flavum infolding. Moderate canal stenosis. Mild foraminal stenosis. L5-S1: Marked right and mild left facet arthropathy. No canal or right foraminal stenosis. Mild left foraminal stenosis. IMPRESSION: Evidence of  discitis/osteomyelitis at L1-L2 with paraspinal edema. Intradiscal abscess extending into the right psoas is not excluded. 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 Multilevel degenerative changes at other levels. Degenerative canal narrowing greatest at L3-L4. These results will be called to the ordering clinician or representative by the Radiologist Assistant, and communication documented in the PACS or [employer]. Electronically Signed   By: Guadlupe Spanish M.D.   On: 01/27/2021 11:56     Assessment/Plan 70 year old female with history of hypertension and hypothyroidism and progressively worsening back pain x2 months and inability to ambulate in spite  of physical therapy.  At abnormal MRI lumbar spine on 3/25 that showing discitis and osteomyelitis at S2 cannot rule out intradiscal abscess.      Discitis/osteomyelitis of lumbar region   Intractable pain   Impaired ambulation -Neurosurgery consult from the emergency room appreciated with the following recommendations -N.p.o. for IR aspiration of disc in the a.m. -Hold oral antibiotics until disc aspiration -IR Robaxin for muscle spasms -SCDs for DVT prophylaxis until procedure in the a.m. -IV morphine for pain -Consult neurosurgery and ID in the a.m.    Hypothyroidism -Continue home levothyroxine  Hypertension -Continue home antihypertensives -Labetalol IV as needed while n.p.o.  Psych as good    DVT prophylaxis: SCDs for IR procedure in the a.m. Code Status: full code  Family Communication:  none  Disposition Plan: Back to previous home environment Consults called: none  Status:At the time of admission, it appears that the appropriate admission status for this patient is INPATIENT. This is judged to be reasonable and necessary in order to provide the required intensity of service to ensure the patient's safety given the presenting symptoms, physical exam findings, and  initial radiographic and laboratory data in the context of their  Comorbid conditions.   Patient requires inpatient status due to high intensity of service, high risk for further deterioration and high frequency of surveillance required.   I certify that at the point of admission it is my clinical judgment that the patient will require inpatient hospital care spanning beyond 2 midnights     Andris Baumann MD [facility]     01/27/2021, 8:27 PM

## 2021-01-27 NOTE — Progress Notes (Signed)
Patient arrived on the floor from the ED at 2130 via stretcher. Patient transferred to bed, cleaned up, skin check completed, and vital signs taken. Risk bracelets applied and admission assessment completed to the best of my ability. Will review orders and proceed with POC.

## 2021-01-27 NOTE — ED Triage Notes (Signed)
EMS assisted pt. To a recliner chair to the lobby.

## 2021-01-28 ENCOUNTER — Encounter (HOSPITAL_COMMUNITY): Payer: Self-pay | Admitting: Internal Medicine

## 2021-01-28 ENCOUNTER — Inpatient Hospital Stay (HOSPITAL_COMMUNITY): Payer: Medicare PPO

## 2021-01-28 DIAGNOSIS — M4646 Discitis, unspecified, lumbar region: Secondary | ICD-10-CM | POA: Diagnosis not present

## 2021-01-28 LAB — URINALYSIS, ROUTINE W REFLEX MICROSCOPIC
Bilirubin Urine: NEGATIVE
Glucose, UA: NEGATIVE mg/dL
Hgb urine dipstick: NEGATIVE
Ketones, ur: NEGATIVE mg/dL
Nitrite: NEGATIVE
Protein, ur: NEGATIVE mg/dL
Specific Gravity, Urine: 1.015 (ref 1.005–1.030)
pH: 7.5 (ref 5.0–8.0)

## 2021-01-28 LAB — URINALYSIS, MICROSCOPIC (REFLEX): RBC / HPF: NONE SEEN RBC/hpf (ref 0–5)

## 2021-01-28 MED ORDER — NAPHAZOLINE-GLYCERIN 0.012-0.2 % OP SOLN
1.0000 [drp] | Freq: Four times a day (QID) | OPHTHALMIC | Status: DC | PRN
  Filled 2021-01-28: qty 15

## 2021-01-28 MED ORDER — TAMSULOSIN HCL 0.4 MG PO CAPS
0.4000 mg | ORAL_CAPSULE | Freq: Every day | ORAL | Status: DC
  Administered 2021-01-28 – 2021-02-07 (×11): 0.4 mg via ORAL
  Filled 2021-01-28 (×11): qty 1

## 2021-01-28 MED ORDER — CARVEDILOL 12.5 MG PO TABS
12.5000 mg | ORAL_TABLET | Freq: Two times a day (BID) | ORAL | Status: DC
  Administered 2021-01-28 – 2021-02-07 (×22): 12.5 mg via ORAL
  Filled 2021-01-28 (×22): qty 1

## 2021-01-28 MED ORDER — LEVOTHYROXINE SODIUM 100 MCG PO TABS
100.0000 ug | ORAL_TABLET | Freq: Every day | ORAL | Status: DC
  Administered 2021-01-28 – 2021-02-07 (×11): 100 ug via ORAL
  Filled 2021-01-28 (×11): qty 1

## 2021-01-28 MED ORDER — TRAMADOL HCL 50 MG PO TABS
50.0000 mg | ORAL_TABLET | Freq: Four times a day (QID) | ORAL | Status: DC | PRN
  Administered 2021-01-29 – 2021-02-04 (×4): 50 mg via ORAL
  Filled 2021-01-28 (×4): qty 1

## 2021-01-28 MED ORDER — VITAMIN D 25 MCG (1000 UNIT) PO TABS
1000.0000 [IU] | ORAL_TABLET | Freq: Two times a day (BID) | ORAL | Status: DC
  Administered 2021-01-28 – 2021-02-07 (×22): 1000 [IU] via ORAL
  Filled 2021-01-28 (×22): qty 1

## 2021-01-28 MED ORDER — FENTANYL CITRATE (PF) 100 MCG/2ML IJ SOLN
INTRAMUSCULAR | Status: AC | PRN
  Administered 2021-01-28: 50 ug via INTRAVENOUS

## 2021-01-28 MED ORDER — MORPHINE SULFATE (PF) 2 MG/ML IV SOLN
2.0000 mg | INTRAVENOUS | Status: DC | PRN
Start: 2021-01-28 — End: 2021-01-30
  Administered 2021-01-29: 2 mg via INTRAVENOUS
  Filled 2021-01-28: qty 1

## 2021-01-28 MED ORDER — POTASSIUM CHLORIDE CRYS ER 20 MEQ PO TBCR
40.0000 meq | EXTENDED_RELEASE_TABLET | Freq: Once | ORAL | Status: AC
  Administered 2021-01-28: 40 meq via ORAL
  Filled 2021-01-28: qty 2

## 2021-01-28 MED ORDER — EZETIMIBE 10 MG PO TABS
10.0000 mg | ORAL_TABLET | Freq: Every day | ORAL | Status: DC
  Administered 2021-01-28 – 2021-02-07 (×11): 10 mg via ORAL
  Filled 2021-01-28 (×11): qty 1

## 2021-01-28 MED ORDER — FENTANYL CITRATE (PF) 100 MCG/2ML IJ SOLN
INTRAMUSCULAR | Status: AC
  Filled 2021-01-28: qty 2

## 2021-01-28 MED ORDER — OXYCODONE HCL 5 MG PO TABS
5.0000 mg | ORAL_TABLET | ORAL | Status: DC | PRN
  Administered 2021-01-28 – 2021-02-03 (×22): 10 mg via ORAL
  Administered 2021-02-03: 5 mg via ORAL
  Administered 2021-02-03: 10 mg via ORAL
  Administered 2021-02-04: 5 mg via ORAL
  Administered 2021-02-04 – 2021-02-07 (×9): 10 mg via ORAL
  Filled 2021-01-28 (×3): qty 2
  Filled 2021-01-28: qty 1
  Filled 2021-01-28 (×16): qty 2
  Filled 2021-01-28: qty 1
  Filled 2021-01-28 (×14): qty 2

## 2021-01-28 MED ORDER — LIDOCAINE HCL 1 % IJ SOLN
INTRAMUSCULAR | Status: AC
  Filled 2021-01-28: qty 20

## 2021-01-28 MED ORDER — MIDAZOLAM HCL 2 MG/2ML IJ SOLN
INTRAMUSCULAR | Status: AC
  Filled 2021-01-28: qty 2

## 2021-01-28 MED ORDER — MIDAZOLAM HCL 2 MG/2ML IJ SOLN
INTRAMUSCULAR | Status: AC | PRN
  Administered 2021-01-28: 1 mg via INTRAVENOUS

## 2021-01-28 NOTE — Sedation Documentation (Signed)
Patient is resting comfortably. 

## 2021-01-28 NOTE — Procedures (Signed)
Interventional Radiology Procedure:   Indications: L1-L2 discitis  Procedure: CT guided aspiration of paraspinal area and disc at L1-L2  Findings: Small amount of bloody fluid removed from right psoas area and left lateral disc space.  Complications: None     EBL: less than 10 ml  Plan: Send fluid for culture.   Ilham Roughton R. Lowella Dandy, MD  Pager: [phone]

## 2021-01-28 NOTE — Sedation Documentation (Signed)
Pt tolerated procedure very well. Will assist back to bed and call report to RN on floor. VSS.  Totals: 35 mins Fentanyl Versed 1mg    Pt has no complaints at this time.

## 2021-01-28 NOTE — Progress Notes (Signed)
Subjective: Patient reports she is appreciative of getting help for her back pain  Objective: Vital signs in last 24 hours: Temp:  [97.5 F (36.4 C)-99.1 F (37.3 C)] 98.8 F (37.1 C) (03/27 0807) Pulse Rate:  [64-79] 67 (03/27 0807) Resp:  [16-18] 16 (03/27 0807) BP: (118-156)/(57-73) 138/58 (03/27 0807) SpO2:  [91 %-99 %] 96 % (03/27 0807) Weight:  [114.4 kg] 114.4 kg (03/26 2215)  Intake/Output from previous day: 03/26 0701 - 03/27 0700 In: 290 [P.O.:240; IV Piggyback:50] Out: 500 [Urine:500] Intake/Output this shift: No intake/output data recorded.  Physical Exam: Full strength both lower extremities.  Pain limited hip flexion.  Lab Results: Recent Labs    01/27/21 1833  WBC 10.1  HGB 10.2*  HCT 32.4*  PLT 344   BMET Recent Labs    01/27/21 1833  NA 137  K 3.4*  CL 99  CO2 30  GLUCOSE 109*  BUN 14  CREATININE 0.88  CALCIUM 9.0    Studies/Results: MR LUMBAR SPINE WO CONTRAST  Result Date: 01/27/2021 CLINICAL DATA:  Lumbar radiculopathy; fall in January EXAM: MRI LUMBAR SPINE WITHOUT CONTRAST TECHNIQUE: Multiplanar, multisequence MR imaging of the lumbar spine was performed. No intravenous contrast was administered. COMPARISON:  Correlation made with CT lumbar spine 12/11/2020 FINDINGS: Segmentation:  Standard. Alignment:  Mild anterolisthesis at L3-L4 and L4-L5. Vertebrae: There is new irregularity and erosion at the L1-L2 opposing endplates with abnormal marrow signal. Vertebral body heights are otherwise maintained. Background diffusely decreased T1 marrow signal may reflect anemia. Conus medullaris and cauda equina: Conus extends to the approximately L1 level. Cauda equina unremarkable apart from level of stenosis. Paraspinal and other soft tissues: Paraspinal edema centered at L1 and L2 with involvement of the psoas muscles. Presence of intradiscal abscess extending into the right psoas is not excluded. Disc levels: T12-L1: Mild facet arthropathy.  No  stenosis. L1-L2: T2 hyperintense soft tissue extends from the disc into the ventral epidural space and slightly below disc level. Moderate facet arthropathy with ligamentum flavum infolding. Marked canal stenosis. Moderate to marked foraminal stenosis. L2-L3: Disc bulge. Moderate facet arthropathy with ligamentum flavum infolding. Minor canal stenosis. Minor right and mild left foraminal stenosis. L3-L4: Anterolisthesis with uncovering of disc bulge. Marked facet arthropathy with ligamentum flavum infolding. Moderate to marked canal stenosis. Mild to moderate foraminal stenosis. L4-L5: Anterolisthesis with uncovering of disc bulge. Marked facet arthropathy with ligamentum flavum infolding. Moderate canal stenosis. Mild foraminal stenosis. L5-S1: Marked right and mild left facet arthropathy. No canal or right foraminal stenosis. Mild left foraminal stenosis. 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 Intradiscal abscess extending into the right psoas is not excluded. 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 Multilevel degenerative changes at other levels. Degenerative canal narrowing greatest at L3-L4. These results will be called to the ordering clinician or representative by the Radiologist Assistant, and communication documented in the PACS or [employer]. Electronically Signed   By: Guadlupe Spanish M.D.   On: 01/27/2021 11:56    Assessment/Plan: To have disc space aspiration in IR today and then to start on IV antibiotics.  Mobilize in brace as tolerated.  ID consult.  I appreciate Dr. Lianne Bushy and Hospitalist Service's help.  No need for neurosurgical intervention at this point.  We will follow patient.    LOS: 1 day    Dorian Heckle, MD 01/28/2021, 8:18 AM

## 2021-01-28 NOTE — Sedation Documentation (Signed)
Patient is resting comfortably. Procedure started °

## 2021-01-28 NOTE — Sedation Documentation (Signed)
Procedure continues 

## 2021-01-28 NOTE — Sedation Documentation (Signed)
Vital signs stable. 

## 2021-01-28 NOTE — Plan of Care (Signed)
Patient is s/p CT guided aspiration of lumbar 1 to lumbar 2 disc space and lumbar 1 to lumbar 2 paraspinal soft tissue by IR. Culture results pending. Patient is not complaining of any pain at this time. No other needs voiced. Will continue to monitor and continue current POC.

## 2021-01-28 NOTE — Sedation Documentation (Signed)
Images taken. Pt resting. VSS

## 2021-01-28 NOTE — Sedation Documentation (Signed)
Pt arrived to CT. Alert and oriented x4, consent signed, pt NPO. Will assist pt to procedure table and prepare for procedure

## 2021-01-28 NOTE — Progress Notes (Signed)
Cassidy Austin  HAL:[id number] DOB: 02-22-1951 DOA: 01/27/2021 PCP: Laurann Montana, MD    Brief Narrative:  70yo with a hx of HTN., nephrolithiasis, and hypothyroidism who has been suffering with intractable low back pain since February 2022.  She required hospitalization in late February when she was found to be septic with pyelonephritis.  She was discharged to a rehab facility but but her low back pain has persisted despite the use of prescription medications.  An outpatient MRI of the lumbar spine 3/25 (obtained after outpt NS referral) revealed evidence concerning for lumbar discitis and osteomyelitis and the patient was therefore directed to the ER for admission.  Significant Events:  2/1-07/2021 admit for AKI, Proteus & E coli UTI w/ sepsis, nephrolithiasis  Antimicrobials:  None for now  DVT prophylaxis: SCDs  Consultants:  Neurosurgery IR ID-pending  Code Status: FULL CODE  Subjective: The patient is seen in her room after her aspiration procedure.  She reports ongoing low back pain but denies chest pain shortness of breath fevers chills nausea or vomiting.  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 - intractable low back pain Neurosurgery following -IR has successfully completed aspiration of the infected area with cultures pending - attempt to withhold antibiotics until culture data available  - ID to be consulted when culture data available - CT lumbar spine obtained 12/11/20 w/ no evidence of this   Nephrolithiasis 5 mm nonobstructive ureteric calculi & 3 mm right VUJcalculi that is probably obstructive noted on CT 12/11/20 - was to see Urology as outpt   Recent pan-sensitive E coli + Proteus pyelonephritis w/ sepsis  Blood cultures negative during that admission - completed 14 days of abx tx    Mild hypokalemia Supplement and follow -check magnesium  Normocytic anemia Likely due to smoldering infection -check anemia panel  HTN Resume home  beta-blocker dose and follow trend  Hypothyroidism Continue usual Synthroid dose   Family Communication:  Status is: Inpatient  Remains inpatient appropriate because:Inpatient level of care appropriate due to severity of illness   Dispo: The patient is from: SNF              Anticipated d/c is to: unclear              Patient currently is not medically stable to d/c.   Difficult to place patient No   Objective: Blood pressure (!) 138/58, pulse 67, temperature 98.8 F (37.1 C), temperature source Oral, resp. rate 16, height 5\' 6"  (1.676 m), weight 114.4 kg, SpO2 96 %.  Intake/Output Summary (Last 24 hours) at 01/28/2021 0942 Last data filed at 01/28/2021 0500 Gross per 24 hour  Intake 290 ml  Output 500 ml  Net -210 ml   Filed Weights   01/27/21 2215  Weight: 114.4 kg    Examination: General: No acute respiratory distress Lungs: Clear to auscultation bilaterally without wheezes or crackles Cardiovascular: Regular rate and rhythm without murmur gallop or rub normal S1 and S2 Abdomen: Nontender, nondistended, soft, bowel sounds positive, no rebound, no ascites, no appreciable mass Extremities: No significant cyanosis, clubbing, or edema bilateral lower extremities  CBC: Recent Labs  Lab 01/27/21 1833  WBC 10.1  NEUTROABS 6.9  HGB 10.2*  HCT 32.4*  MCV 87.1  PLT 344   Basic Metabolic Panel: Recent Labs  Lab 01/27/21 1833  NA 137  K 3.4*  CL 99  CO2 30  GLUCOSE 109*  BUN 14  CREATININE 0.88  CALCIUM 9.0   GFR: Estimated Creatinine Clearance:  75.3 mL/min (by C-G formula based on SCr of 0.88 mg/dL).  Liver Function Tests: Recent Labs  Lab 01/27/21 1833  AST 18  ALT 10  ALKPHOS 80  BILITOT 0.8  PROT 8.1  ALBUMIN 3.1*   No results for input(s): LIPASE, AMYLASE in the last 168 hours. No results for input(s): AMMONIA in the last 168 hours.  Coagulation Profile: No results for input(s): INR, PROTIME in the last 168 hours.  Cardiac Enzymes: No  results for input(s): CKTOTAL, CKMB, CKMBINDEX, TROPONINI in the last 168 hours.  HbA1C: No results found for: HGBA1C  CBG: No results for input(s): GLUCAP in the last 168 hours.  Recent Results (from the past 240 hour(s))  Resp Panel by RT-PCR (Flu A&B, Covid) Nasopharyngeal Swab     Status: None   Collection Time: 01/27/21  6:33 PM   Specimen: Nasopharyngeal Swab; Nasopharyngeal(NP) swabs in vial transport medium  Result Value Ref Range Status   SARS Coronavirus 2 by RT PCR NEGATIVE NEGATIVE Final    Comment: (NOTE) SARS-CoV-2 target nucleic acids are NOT DETECTED.  The SARS-CoV-2 RNA is generally detectable in upper respiratory specimens during the acute phase of infection. The lowest concentration of SARS-CoV-2 viral copies this assay can detect is 138 copies/mL. A negative result does not preclude SARS-Cov-2 infection and should not be used as the sole basis for treatment or other patient management decisions. A negative result may occur with  improper specimen collection/handling, submission of specimen other than nasopharyngeal swab, presence of viral mutation(s) within the areas targeted by this assay, and inadequate number of viral copies(<138 copies/mL). A negative result must be combined with clinical observations, patient history, and epidemiological information. The expected result is Negative.  Fact Sheet for Patients:  BloggerCourse.com  Fact Sheet for Healthcare Providers:  SeriousBroker.it  This test is no t yet approved or cleared by the [place] FDA and  has been authorized for detection and/or diagnosis of SARS-CoV-2 by FDA under an Emergency Use Authorization (EUA). This EUA will remain  in effect (meaning this test can be used) for the duration of the COVID-19 declaration under Section 564(b)(1) of the Act, 21 U.S.C.section 360bbb-3(b)(1), unless the authorization is terminated  or revoked sooner.        Influenza A by PCR NEGATIVE NEGATIVE Final   Influenza B by PCR NEGATIVE NEGATIVE Final    Comment: (NOTE) The Xpert Xpress SARS-CoV-2/FLU/RSV plus assay is intended as an aid in the diagnosis of influenza from Nasopharyngeal swab specimens and should not be used as a sole basis for treatment. Nasal washings and aspirates are unacceptable for Xpert Xpress SARS-CoV-2/FLU/RSV testing.  Fact Sheet for Patients: BloggerCourse.com  Fact Sheet for Healthcare Providers: SeriousBroker.it  This test is not yet approved or cleared by the [place] FDA and has been authorized for detection and/or diagnosis of SARS-CoV-2 by FDA under an Emergency Use Authorization (EUA). This EUA will remain in effect (meaning this test can be used) for the duration of the COVID-19 declaration under Section 564(b)(1) of the Act, 21 U.S.C. section 360bbb-3(b)(1), unless the authorization is terminated or revoked.  Performed at [facility], [address], [place], [place] [address]      Scheduled Meds: . fentaNYL      . lidocaine      . midazolam       Continuous Infusions: . sodium chloride 75 mL/hr at 01/27/21 2042  . methocarbamol (ROBAXIN) IV       LOS: 1 day  Lonia Blood, MD [facility] Office  [phone] Pager - Text Page per Amion  If 7PM-7AM, please contact night-coverage per Amion 01/28/2021, 9:42 AM

## 2021-01-28 NOTE — Sedation Documentation (Signed)
Patient is resting comfortably. Procedure continues 

## 2021-01-28 NOTE — H&P (Signed)
Chief Complaint: Back pain  Referring Physician(s): Maeola Harman  Supervising Physician: Richarda Overlie  Patient Status: [facility] - In-pt  History of Present Illness: Cassidy Austin is a 70 y.o. female with medical issues including hypertension and hypothyroidism.  She reportslow back pain which started around February. She states it worsenied to the point she had difficulty ambulating.    She was hospitalized later that month and found to have sepsis secondary to pyelonephritis.    She was discharged to rehab following hospitalization but continued to have low back pain.  Pain medication and muscle relaxants did not alleviate the pain.    MRI done yesterday showed= Evidence of discitis/osteomyelitis at L1-L2 with paraspinal edema. Intradiscal abscess extending into the right psoas is not excluded.  We are asked to evaluate her for disc aspiration.  She is NPO. No blood thinners.  Past Medical History:  Diagnosis Date  . Complication of anesthesia    hard to wake up after eye surgery in 1970s  . Goiter    with thyroid nodule, FNA 12/15 benign follicular nodule  . Hypercholesterolemia   . Hypertension   . Hypothyroidism   . OA (osteoarthritis) of knee    mod.  Marland Kitchen Spondylolisthesis of lumbar region     Past Surgical History:  Procedure Laterality Date  . COLONOSCOPY    . EYE SURGERY    . THYROID SURGERY  1999    Allergies: Patient has no known allergies.  Medications: Prior to Admission medications   Medication Sig Start Date End Date Taking? Authorizing Provider  ASPERCREME LIDOCAINE 4 % PTCH Apply 1 patch topically daily. 01/12/21  Yes [provider]  aspirin 81 MG chewable tablet Chew 81 mg by mouth every morning.   Yes [provider]  carvedilol (COREG) 12.5 MG tablet Take 12.5 mg by mouth 2 (two) times daily. 01/22/21  Yes [provider]  cholecalciferol (VITAMIN D) 1000 UNITS tablet Take 1,000 Units by mouth 2 (two) times  daily.   Yes [provider]  cyclobenzaprine (FLEXERIL) 5 MG tablet Take 5 mg by mouth 3 (three) times daily as needed for muscle spasms. 12/29/20  Yes [provider]  ezetimibe (ZETIA) 10 MG tablet Take 10 mg by mouth daily. 11/21/20  Yes [provider]  gabapentin (NEURONTIN) 100 MG capsule Take 1 capsule (100 mg total) by mouth 3 (three) times daily. 12/29/20  Yes Robinson, [place] N, PA-C  [employer] [facility]) OINT Apply 1 application topically daily. 01/21/21  Yes [provider]  levothyroxine (SYNTHROID, LEVOTHROID) 100 MCG tablet Take 100 mcg by mouth daily at 6 (six) AM.   Yes [provider]  Magnesium 250 MG TABS Take 500 mg by mouth every morning.   Yes [provider]  olmesartan-hydrochlorothiazide (BENICAR HCT) 40-25 MG tablet Take 1 tablet by mouth daily. 12/29/20  Yes [provider]  oxyCODONE (ROXICODONE) 5 MG immediate release tablet Take 1 tablet (5 mg total) by mouth every 6 (six) hours as needed for severe pain. 12/29/20  Yes Robinson, [place] N, PA-C  Skin Protectants, Misc. (MINERIN CREME) CREA Apply 1 application topically daily. 12/29/20  Yes [provider]  tamsulosin (FLOMAX) 0.4 MG CAPS capsule Take 1 capsule (0.4 mg total) by mouth daily after breakfast. 12/13/20  Yes Vann, Jessica U, DO  tetrahydrozoline 0.05 % ophthalmic solution Place 1 drop into both eyes daily as needed (dry eyes).   Yes [provider]  tiZANidine (ZANAFLEX) 2 MG tablet Take  1 tablet (2 mg total) by mouth every 8 (eight) hours as needed for muscle spasms. 12/29/20  Yes Robinson, [place] N, PA-C  Turmeric 500 MG CAPS Take 1 capsule by mouth 2 (two) times daily. 12/13/20  Yes [provider]  vitamin C (ASCORBIC ACID) 500 MG tablet Take 500 mg by mouth daily.   Yes [provider]  cephALEXin (KEFLEX) 500 MG capsule Take 1 capsule (500 mg total) by mouth every 8 (eight) hours. Patient not taking: No  sig reported 12/13/20   Marlin CanaryVann, Jessica U, DO  lidocaine (LIDODERM) 5 % Place 1 patch onto the skin daily. Remove & Discard patch within 12 hours or as directed by MD Patient not taking: No sig reported 12/13/20   Joseph ArtVann, Jessica U, DO  traMADol (ULTRAM) 50 MG tablet Take 50 mg by mouth every 6 (six) hours as needed for moderate pain. 01/24/21   [provider]     Family History  Problem Relation Age of Onset  . Heart failure Mother   . Breast cancer Neg Hx     Social History   Socioeconomic History  . Marital status: Divorced    Spouse name: Not on file  . Number of children: Not on file  . Years of education: Not on file  . Highest education level: Not on file  Occupational History  . Not on file  Tobacco Use  . Smoking status: Never Smoker  . Smokeless tobacco: Never Used  Vaping Use  . Vaping Use: Never used  Substance and Sexual Activity  . Alcohol use: No  . Drug use: No  . Sexual activity: Not on file  Other Topics Concern  . Not on file  Social History Narrative  . Not on file   Social Determinants of Health   Financial Resource Strain: Not on file  Food Insecurity: Not on file  Transportation Needs: Not on file  Physical Activity: Not on file  Stress: Not on file  Social Connections: Not on file     Review of Systems: A 12 point ROS discussed and pertinent positives are indicated in the HPI above.  All other systems are negative.  Review of Systems  Constitutional: Positive for activity change.  Musculoskeletal: Positive for back pain.    Vital Signs: BP (!) 138/58 (BP Location: Left Arm)   Pulse 67   Temp 98.8 F (37.1 C) (Oral)   Resp 16   Ht 5\' 6"  (1.676 m)   Wt 114.4 kg   SpO2 96%   BMI 40.71 kg/m   Physical Exam Vitals reviewed.  Constitutional:      Appearance: Normal appearance.  HENT:     Head: Normocephalic and atraumatic.  Eyes:     Extraocular Movements: Extraocular movements intact.  Cardiovascular:     Rate and Rhythm:  Normal rate and regular rhythm.  Pulmonary:     Effort: Pulmonary effort is normal. No respiratory distress.     Breath sounds: Normal breath sounds.  Abdominal:     General: There is no distension.     Palpations: Abdomen is soft.  Musculoskeletal:        General: Normal range of motion.     Lumbar back: Tenderness present.       Back:  Skin:    General: Skin is warm and dry.  Neurological:     General: No focal deficit present.     Mental Status: She is alert and oriented to person, place, and time.  Psychiatric:  Mood and Affect: Mood normal.        Behavior: Behavior normal.        Thought Content: Thought content normal.        Judgment: Judgment normal.     Imaging: MR LUMBAR SPINE WO CONTRAST  Result Date: 01/27/2021 CLINICAL DATA:  Lumbar radiculopathy; fall in January EXAM: MRI LUMBAR SPINE WITHOUT CONTRAST TECHNIQUE: Multiplanar, multisequence MR imaging of the lumbar spine was performed. No intravenous contrast was administered. COMPARISON:  Correlation made with CT lumbar spine 12/11/2020 FINDINGS: Segmentation:  Standard. Alignment:  Mild anterolisthesis at L3-L4 and L4-L5. Vertebrae: There is new irregularity and erosion at the L1-L2 opposing endplates with abnormal marrow signal. Vertebral body heights are otherwise maintained. Background diffusely decreased T1 marrow signal may reflect anemia. Conus medullaris and cauda equina: Conus extends to the approximately L1 level. Cauda equina unremarkable apart from level of stenosis. Paraspinal and other soft tissues: Paraspinal edema centered at L1 and L2 with involvement of the psoas muscles. Presence of intradiscal abscess extending into the right psoas is not excluded. Disc levels: T12-L1: Mild facet arthropathy.  No stenosis. L1-L2: T2 hyperintense soft tissue extends from the disc into the ventral epidural space and slightly below disc level. Moderate facet arthropathy with ligamentum flavum infolding. Marked canal  stenosis. Moderate to marked foraminal stenosis. L2-L3: Disc bulge. Moderate facet arthropathy with ligamentum flavum infolding. Minor canal stenosis. Minor right and mild left foraminal stenosis. L3-L4: Anterolisthesis with uncovering of disc bulge. Marked facet arthropathy with ligamentum flavum infolding. Moderate to marked canal stenosis. Mild to moderate foraminal stenosis. L4-L5: Anterolisthesis with uncovering of disc bulge. Marked facet arthropathy with ligamentum flavum infolding. Moderate canal stenosis. Mild foraminal stenosis. L5-S1: Marked right and mild left facet arthropathy. No canal or right foraminal stenosis. Mild left foraminal stenosis. 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 Intradiscal abscess extending into the right psoas is not excluded. 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 Multilevel degenerative changes at other levels. Degenerative canal narrowing greatest at L3-L4. These results will be called to the ordering clinician or representative by the Radiologist Assistant, and communication documented in the PACS or [employer]. Electronically Signed   By: Guadlupe Spanish M.D.   On: 01/27/2021 11:56    Labs:  CBC: Recent Labs    12/11/20 0427 12/12/20 0332 12/13/20 0330 01/27/21 1833  WBC 18.6* 19.6* 15.5* 10.1  HGB 8.9* 8.9* 8.5* 10.2*  HCT 26.9* 25.3* 25.7* 32.4*  PLT 259 327 336 344    COAGS: No results for input(s): INR, APTT in the last 8760 hours.  BMP: Recent Labs    12/10/20 0053 12/11/20 0427 12/13/20 0330 01/27/21 1833  NA 139 139 137 137  K 4.9 5.0 5.1 3.4*  CL 107 105 104 99  CO2 23 25 25 30   GLUCOSE 106* 94 103* 109*  BUN 46* 32* 19 14  CALCIUM 8.0* 8.2* 8.2* 9.0  CREATININE 1.53* 1.48* 1.33* 0.88  GFRNONAA 36* 38* 43* >60    LIVER FUNCTION TESTS: Recent Labs    12/05/20 0424 12/08/20 0755 12/13/20 0330 01/27/21 1833  BILITOT 1.3* 0.7  0.7 0.8  AST 58* 28 37 18  ALT 56* 46* 50* 10  ALKPHOS 74 91 98 80  PROT 6.6 6.2* 6.4* 8.1  ALBUMIN 2.4* 1.9* 2.0* 3.1*    TUMOR MARKERS: No results for input(s): AFPTM, CEA, CA199, CHROMGRNA in the last 8760 hours.  Assessment and Plan:  L2 Discitis w/ possible  intradiscal abscess.   Images reviewed by Dr. Lowella Dandy.  Will proceed with CT guided disc/abscess aspiration today by Dr. Lowella Dandy.  Risks and benefits of disc aspiration was discussed with the patient and/or patient's family including, but not limited to bleeding, infection, damage to adjacent structures or low yield requiring additional tests.  All of the questions were answered and there is agreement to proceed.  Consent signed and in chart.  Thank you for this interesting consult.  I greatly enjoyed meeting Cassidy Austin and look forward to participating in their care.  A copy of this report was sent to the requesting provider on this date.  Electronically Signed: Gwynneth Macleod, PA-C   01/28/2021, 8:59 AM      I spent a total of 20 Minutes in face to face in clinical consultation, greater than 50% of which was counseling/coordinating care for disc aspiration.

## 2021-01-28 NOTE — Sedation Documentation (Signed)
Patient is resting comfortably. Procedure continues, samples taken

## 2021-01-29 DIAGNOSIS — M4646 Discitis, unspecified, lumbar region: Secondary | ICD-10-CM | POA: Diagnosis not present

## 2021-01-29 DIAGNOSIS — E43 Unspecified severe protein-calorie malnutrition: Secondary | ICD-10-CM | POA: Insufficient documentation

## 2021-01-29 LAB — COMPREHENSIVE METABOLIC PANEL
ALT: 10 U/L (ref 0–44)
AST: 12 U/L — ABNORMAL LOW (ref 15–41)
Albumin: 2.6 g/dL — ABNORMAL LOW (ref 3.5–5.0)
Alkaline Phosphatase: 69 U/L (ref 38–126)
Anion gap: 5 (ref 5–15)
BUN: 10 mg/dL (ref 8–23)
CO2: 28 mmol/L (ref 22–32)
Calcium: 8.9 mg/dL (ref 8.9–10.3)
Chloride: 106 mmol/L (ref 98–111)
Creatinine, Ser: 0.83 mg/dL (ref 0.44–1.00)
GFR, Estimated: >60 mL/min (ref >60)
Glucose, Bld: 124 mg/dL — ABNORMAL HIGH (ref 70–99)
Potassium: 3.6 mmol/L (ref 3.5–5.1)
Sodium: 139 mmol/L (ref 135–145)
Total Bilirubin: 0.5 mg/dL (ref 0.3–1.2)
Total Protein: 6.8 g/dL (ref 6.5–8.1)

## 2021-01-29 LAB — C-REACTIVE PROTEIN: CRP: 6.8 mg/dL — ABNORMAL HIGH (ref <1.0)

## 2021-01-29 LAB — URINE CULTURE

## 2021-01-29 LAB — CBC
HCT: 29.1 % — ABNORMAL LOW (ref 36.0–46.0)
Hemoglobin: 9.4 g/dL — ABNORMAL LOW (ref 12.0–15.0)
MCH: 28.2 pg (ref 26.0–34.0)
MCHC: 32.3 g/dL (ref 30.0–36.0)
MCV: 87.4 fL (ref 80.0–100.0)
Platelets: 267 10*3/uL (ref 150–400)
RBC: 3.33 MIL/uL — ABNORMAL LOW (ref 3.87–5.11)
RDW: 16 % — ABNORMAL HIGH (ref 11.5–15.5)
WBC: 6.8 10*3/uL (ref 4.0–10.5)
nRBC: 0 % (ref 0.0–0.2)

## 2021-01-29 LAB — FERRITIN: Ferritin: 455 ng/mL — ABNORMAL HIGH (ref 11–307)

## 2021-01-29 LAB — IRON AND TIBC
Iron: 43 ug/dL (ref 28–170)
Saturation Ratios: 26 % (ref 10.4–31.8)
TIBC: 162 ug/dL — ABNORMAL LOW (ref 250–450)
UIBC: 119 ug/dL

## 2021-01-29 LAB — RETICULOCYTES
Immature Retic Fract: 12.7 % (ref 2.3–15.9)
RBC.: 3.27 MIL/uL — ABNORMAL LOW (ref 3.87–5.11)
Retic Count, Absolute: 40.5 10*3/uL (ref 19.0–186.0)
Retic Ct Pct: 1.2 % (ref 0.4–3.1)

## 2021-01-29 LAB — MAGNESIUM: Magnesium: 2 mg/dL (ref 1.7–2.4)

## 2021-01-29 LAB — VITAMIN B12: Vitamin B-12: 2901 pg/mL — ABNORMAL HIGH (ref 180–914)

## 2021-01-29 LAB — FOLATE: Folate: 14.5 ng/mL (ref >5.9)

## 2021-01-29 LAB — CK: Total CK: 45 U/L (ref 38–234)

## 2021-01-29 MED ORDER — SODIUM CHLORIDE 0.9 % IV SOLN
2.0000 g | INTRAVENOUS | Status: DC
  Administered 2021-01-29 – 2021-01-31 (×3): 2 g via INTRAVENOUS
  Filled 2021-01-29 (×3): qty 20

## 2021-01-29 MED ORDER — ENSURE ENLIVE PO LIQD
237.0000 mL | Freq: Two times a day (BID) | ORAL | Status: DC
  Administered 2021-01-29 – 2021-01-31 (×5): 237 mL via ORAL

## 2021-01-29 MED ORDER — SODIUM CHLORIDE 0.9 % IV SOLN
750.0000 mg | Freq: Every day | INTRAVENOUS | Status: DC
  Administered 2021-01-29: 750 mg via INTRAVENOUS
  Filled 2021-01-29 (×3): qty 15

## 2021-01-29 MED ORDER — ADULT MULTIVITAMIN W/MINERALS CH
1.0000 | ORAL_TABLET | Freq: Every day | ORAL | Status: DC
  Administered 2021-01-29 – 2021-02-07 (×10): 1 via ORAL
  Filled 2021-01-29 (×10): qty 1

## 2021-01-29 MED ORDER — METAXALONE 800 MG PO TABS
400.0000 mg | ORAL_TABLET | Freq: Three times a day (TID) | ORAL | Status: DC | PRN
  Administered 2021-01-29 – 2021-02-07 (×24): 400 mg via ORAL
  Filled 2021-01-29 (×10): qty 0.5
  Filled 2021-01-29: qty 1
  Filled 2021-01-29 (×18): qty 0.5

## 2021-01-29 NOTE — Progress Notes (Signed)
Initial Nutrition Assessment  DOCUMENTATION CODES:   Severe malnutrition in context of acute illness/injury  INTERVENTION:    Ensure Enlive po BID, each supplement provides 350 kcal and 20 grams of protein  MVI daily   NUTRITION DIAGNOSIS:   Severe Malnutrition related to acute illness (lumbar discitis/osteomyelitis) as evidenced by percent weight loss,energy intake < or equal to 50% for > or equal to 5 days.  GOAL:   Patient will meet greater than or equal to 90% of their needs  MONITOR:   PO intake,Supplement acceptance,Weight trends,Labs,I & O's,Skin  REASON FOR ASSESSMENT:   Malnutrition Screening Tool    ASSESSMENT:   Patient with PMH significant for HTN and hypothyroidism. Presents this admission with impaired ambulation secondary to lumbar discitis/osteomyelitis.  3/27- s/p CT guided aspiration of paraspinal area and disc at L1-L2  Patient endorses increased intake over the last 3-4 weeks due to nausea and loss in appetite.  During this time, smelling food would make her nauseous but she continued to eat three small meals daily. Each meal consisted of a protein, vegetable, and grain but patient could only tolerate the vegetables from each. She was not able to consume protein off her tray. Now that her back pain has decreased she was able to eat cheese toast for breakfast without complication. Discussed the importance of protein intake for preservation of lean body mass. Patient will to try Ensure.   Patient reports a UBW of 295 lb and a recent weight loss of 30 lb. Records indicate patient weighed 295 lb on 2/25 and 252 lb this admission (14% wt loss in one month, significant for time frame).   Drips: NS @ 75 ml/hr  Medications: vitamin D Labs: CBG 109-124  NUTRITION - FOCUSED PHYSICAL EXAM:  Flowsheet Row Most Recent Value  Orbital Region No depletion  Upper Arm Region No depletion  Thoracic and Lumbar Region Unable to assess  Buccal Region No depletion   Temple Region No depletion  Clavicle Bone Region No depletion  Clavicle and Acromion Bone Region No depletion  Scapular Bone Region Unable to assess  Dorsal Hand No depletion  Patellar Region No depletion  Anterior Thigh Region No depletion  Posterior Calf Region No depletion  Edema (RD Assessment) Mild  Hair Reviewed  Eyes Reviewed  Mouth Reviewed  Skin Reviewed     Diet Order:   Diet Order            Diet regular Room service appropriate? Yes; Fluid consistency: Thin  Diet effective now                 EDUCATION NEEDS:   Education needs have been addressed  Skin:  Skin Integrity Issues:: Other (Comment) Other: skin tear- L thigh  Last BM:  3/26  Height:   Ht Readings from Last 1 Encounters:  01/27/21 5\' 6"  (1.676 m)    Weight:   Wt Readings from Last 1 Encounters:  01/27/21 114.4 kg    BMI:  Body mass index is 40.71 kg/m.  Estimated Nutritional Needs:   Kcal:  1800-2000 kcal  Protein:  90-105 grams  Fluid:  >/= 1.8 L/day  01/29/21 RD, LDN Clinical Nutrition Pager listed in AMION

## 2021-01-29 NOTE — Progress Notes (Signed)
Patient was on consult list requesting an AD. Chaplain took AD to patient's room and talked with her about it. Patient will let Chaplain know when she is ready to complete documents.

## 2021-01-29 NOTE — Consult Note (Signed)
[facility]                                                                                        Patient Identification: Patient Name: Cassidy Austin MRN: [id number] [place] Date: 01/27/2021  3:09 PM Today's Date: 01/29/2021 Reason for consult: Discitis and osteomyelitis  Requesting provider: Victoriano Lain   Principal Problem:   Discitis/osteomyelitis of lumbar region Active Problems:   Hypothyroidism   Hypertension   Intractable pain   Impaired ambulation   Protein-calorie malnutrition, severe   Antibiotics: None   Lines/Tubes: PIVs,   Assessment Discitis/osteomyelitis at L1-L2 with paraspinal edema Possible Intradiscal abscess extending into the right psoas  3/16 blood cx NG in 2 days  S/p CT guided aspiration on 3/27. CX pending   Recommendations  Start empiric Daptomycin and ceftriaxone  Baseline CPK, ESR and CRP ( Ordered ) Fu aspirate cultures Monitor CBC and CMP on IV antibiotics Anticipate 6-8 weeks of IV antibiotics Following  Rest of the management as per the primary team. Please call with questions or concerns.  Thank you for the consult  __________________________________________________________________________________________________________ HPI and Hospital Course: 70 year old female with past medical history of hypertension, hypothyroidism who presented to the ED on 01/27/2021 with complaint of progressive low back pain.  Per patient, low back pain started sometime in February which was unrelieved by pain medications.  She was also hospitalized 12/05/2020 - 12/13/2020 for chest pain/AKI and E. coli UTI.  She was treated with IV ceftriaxone and p.o. cephalexin to complete a course of 14 days.  She was discharged to SNF.  CT abdomen pelvis and CT lumbar spine ( 12/11/20) was done at that time with following impression:  CT abdomen/pelvis ( 12/11/20) IMPRESSION: Mild right  hydroureteronephrosis due to 2-3 mm calculus in bladder in the region of the right UVJ.  Tiny right renal calculi.  3.7 cm bladder diverticulum.  Small uterine fibroids.  Mild left common iliac lymphadenopathy, which is nonspecific. Recommend continued follow-up by CT in 3 months.  CT Lumbar spine 12/11/20  IMPRESSION: 1. Appearance suspicious for acute obstructive uropathy on the right, with superimposed right nephrolithiasis. Recommend follow-up noncontrast CT Abdomen and Pelvis for confirmation.  2.  No acute osseous abnormality in the lumbar spine. Multilevel lumbar spondylolisthesis with severe facet arthropathy L3-L4 through L5-S1. Superimposed Ankylosed facets at L5-S1. And lower thoracic interbody ankylosis. Multifactorial moderate to severe spinal and/or lateral recess stenosis at L3-L4 and L4-L5. Mild degenerative spinal stenosis suspected at L1-L2 and L2-L3.  3. Aortic Atherosclerosis (ICD10-I70.0).  Patient says pain is 10 out of 10 and is unrelieved by analgesics.  It radiates down to her buttocks and thigh.  She denies having any bowel or bladder incontinence, weakness, numbness or tingling.  She denies any fevers but endorses having chills, denies sweats.  Denies nausea vomiting or diarrhea.  She did have blood in urine in February when she had a UTI.  Denies any cough shortness of breath or chest pain. Denies any steroid injections, any trauma to the back.  Denies any frequent history of UTIs Denies smoking alcohol and drugs.  Denies any pets at home.  At ED, she was afebrile, no leukocytosis.  Lactic acid 1.1 MRI lumbar spine 01/26/2021 showed evidence of discitis/osteomyelitis at L1-L2 with paraspinal edema.  Intradiscal abscess extending into the right psoas is not excluded + findings related to degenerative disc disease  Patient seen by neurosurgery, recommended IR guided aspiration and empiric antibiotics post aspiration  Patient underwent CT-guided  aspiration of paraspinal area and disc at  L1-L2.  Small amount of bloody fluid removed from right psoas area and left lateral disc space.  Cultures pending.  ROS: General- Denies fever,  loss of appetite and loss of weight, CHILLS+ HEENT - Denies headache, blurry vision, neck pain, sinus pain Chest - Denies any chest pain, SOB or cough CVS- Denies any dizziness/lightheadedness, syncopal attacks, palpitations Abdomen- Denies any nausea, vomiting, abdominal pain, hematochezia and diarrhea Neuro - Denies any weakness, numbness, tingling sensation Psych - Denies any changes in mood irritability or depressive symptoms GU- Denies any burning, dysuria, hematuria or increased frequency of urination Skin - denies any rashes/lesions MSK - denies any joint pain/swelling or restricted ROM    Past Medical History:  Diagnosis Date  . Complication of anesthesia    hard to wake up after eye surgery in 1970s  . Goiter    with thyroid nodule, FNA 82/42 benign follicular nodule  . Hypercholesterolemia   . Hypertension   . Hypothyroidism   . OA (osteoarthritis) of knee    mod.  Marland Kitchen Spondylolisthesis of lumbar region    Past Surgical History:  Procedure Laterality Date  . COLONOSCOPY    . EYE SURGERY    . THYROID SURGERY  1999    Scheduled Meds: . carvedilol  12.5 mg Oral BID  . cholecalciferol  1,000 Units Oral BID  . ezetimibe  10 mg Oral Daily  . feeding supplement  237 mL Oral BID BM  . levothyroxine  100 mcg Oral Q0600  . multivitamin with minerals  1 tablet Oral Daily  . tamsulosin  0.4 mg Oral QPC breakfast   Continuous Infusions: . sodium chloride 75 mL/hr at 01/29/21 0018  . methocarbamol (ROBAXIN) IV 500 mg (01/29/21 1411)   PRN Meds:.acetaminophen **OR** [DISCONTINUED] acetaminophen, methocarbamol (ROBAXIN) IV, morphine injection, naphazoline-glycerin, ondansetron **OR** ondansetron (ZOFRAN) IV, oxyCODONE, traMADol  No Known Allergies  Social History   Socioeconomic History   . Marital status: Divorced    Spouse name: Not on file  . Number of children: Not on file  . Years of education: Not on file  . Highest education level: Not on file  Occupational History  . Not on file  Tobacco Use  . Smoking status: Never Smoker  . Smokeless tobacco: Never Used  Vaping Use  . Vaping Use: Never used  Substance and Sexual Activity  . Alcohol use: No  . Drug use: No  . Sexual activity: Not on file  Other Topics Concern  . Not on file  Social History Narrative  . Not on file   Social Determinants of Health   Financial Resource Strain: Not on file  Food Insecurity: Not on file  Transportation Needs: Not on file  Physical Activity: Not on file  Stress: Not on file  Social Connections: Not on file  Intimate Partner Violence: Not on file    Vitals BP (!) 126/49 (BP Location: Left Arm)   Pulse 68   Temp 98.7 F (37.1 C) (Oral)   Resp 16   Ht $R'5\' 6"'Pp$  (1.676 m)   Wt 114.4 kg   SpO2 98%  BMI 40.71 kg/m    Physical Exam Constitutional:  not in acute distress, obese     Comments:   Cardiovascular:     Rate and Rhythm: Normal rate and regular rhythm.     Heart sounds: No murmur heard.   Pulmonary:     Effort: Pulmonary effort is normal.     Comments:   Abdominal:     Palpations: Abdomen is soft.     Tenderness: Non tender  Musculoskeletal:        General: No swelling or tenderness. Lumbar spinal tenderness, LE power 5/5  Skin:    Comments: no obvious rashes   Neurological:     General: No focal deficit present.   Psychiatric:        Mood and Affect: Mood normal.    Pertinent Microbiology Results for orders placed or performed during the hospital encounter of 01/27/21  Culture, blood (routine x 2)     Status: None (Preliminary result)   Collection Time: 01/27/21  6:33 PM   Specimen: BLOOD  Result Value Ref Range Status   Specimen Description BLOOD LEFT ANTECUBITAL  Final   Special Requests   Final    BOTTLES DRAWN AEROBIC AND  ANAEROBIC Blood Culture adequate volume   Culture   Final    NO GROWTH 2 DAYS Performed at [facility], [place] [address], [place], [place] [address]    Report Status PENDING  Incomplete  Resp Panel by RT-PCR (Flu A&B, Covid) Nasopharyngeal Swab     Status: None   Collection Time: 01/27/21  6:33 PM   Specimen: Nasopharyngeal Swab; Nasopharyngeal(NP) swabs in vial transport medium  Result Value Ref Range Status   SARS Coronavirus 2 by RT PCR NEGATIVE NEGATIVE Final    Comment: (NOTE) SARS-CoV-2 target nucleic acids are NOT DETECTED.  The SARS-CoV-2 RNA is generally detectable in upper respiratory specimens during the acute phase of infection. The lowest concentration of SARS-CoV-2 viral copies this assay can detect is 138 copies/mL. A negative result does not preclude SARS-Cov-2 infection and should not be used as the sole basis for treatment or other patient management decisions. A negative result may occur with  improper specimen collection/handling, submission of specimen other than nasopharyngeal swab, presence of viral mutation(s) within the areas targeted by this assay, and inadequate number of viral copies(<138 copies/mL). A negative result must be combined with clinical observations, patient history, and epidemiological information. The expected result is Negative.  Fact Sheet for Patients:  EntrepreneurPulse.com.au  Fact Sheet for Healthcare Providers:  IncredibleEmployment.be  This test is no t yet approved or cleared by the [place] FDA and  has been authorized for detection and/or diagnosis of SARS-CoV-2 by FDA under an Emergency Use Authorization (EUA). This EUA will remain  in effect (meaning this test can be used) for the duration of the COVID-19 declaration under Section 564(b)(1) of the Act, 21 U.S.C.section 360bbb-3(b)(1), unless the authorization is terminated  or revoked sooner.       Influenza A by PCR NEGATIVE  NEGATIVE Final   Influenza B by PCR NEGATIVE NEGATIVE Final    Comment: (NOTE) The Xpert Xpress SARS-CoV-2/FLU/RSV plus assay is intended as an aid in the diagnosis of influenza from Nasopharyngeal swab specimens and should not be used as a sole basis for treatment. Nasal washings and aspirates are unacceptable for Xpert Xpress SARS-CoV-2/FLU/RSV testing.  Fact Sheet for Patients: EntrepreneurPulse.com.au  Fact Sheet for Healthcare Providers: IncredibleEmployment.be  This test is not yet approved or cleared by  the [employer] and has been authorized for detection and/or diagnosis of SARS-CoV-2 by FDA under an Emergency Use Authorization (EUA). This EUA will remain in effect (meaning this test can be used) for the duration of the COVID-19 declaration under Section 564(b)(1) of the Act, 21 U.S.C. section 360bbb-3(b)(1), unless the authorization is terminated or revoked.  Performed at [facility], [place] [address] [place], [place] [address]   Urine culture     Status: Abnormal   Collection Time: 01/27/21  6:33 PM   Specimen: Urine, Random  Result Value Ref Range Status   Specimen Description URINE, RANDOM  Final   Special Requests   Final    NONE Performed at [facility], [place] [address], [place], [place] [address]    Culture MULTIPLE SPECIES PRESENT, SUGGEST RECOLLECTION (A)  Final   Report Status 01/29/2021 FINAL  Final  Aerobic/Anaerobic Culture (surgical/deep wound)     Status: None (Preliminary result)   Collection Time: 01/28/21 10:47 AM   Specimen: Fluid; Tissue  Result Value Ref Range Status   Specimen Description FLUID L1/L2 INTERVERTEBRAL DISC AND PARASPINAL  Final   Special Requests NONE  Final   Gram Stain   Final    MODERATE WBC PRESENT, PREDOMINANTLY PMN NO ORGANISMS SEEN    Culture   Final    CULTURE REINCUBATED FOR BETTER GROWTH Performed at [facility], [place] [address], [place], [place]  [address]    Report Status PENDING  Incomplete    Pertinent Lab seen by me: CBC Latest Ref Rng & Units 01/29/2021 01/27/2021 12/13/2020  WBC 4.0 - 10.5 K/uL 6.8 10.1 15.5(H)  Hemoglobin 12.0 - 15.0 g/dL 9.4(L) 10.2(L) 8.5(L)  Hematocrit 36.0 - 46.0 % 29.1(L) 32.4(L) 25.7(L)  Platelets 150 - 400 K/uL 267 344 336   CMP Latest Ref Rng & Units 01/29/2021 01/27/2021 12/13/2020  Glucose 70 - 99 mg/dL 124(H) 109(H) 103(H)  BUN 8 - 23 mg/dL $Remove'10 14 19  'KSXCNCU$ Creatinine 0.44 - 1.00 mg/dL 0.83 0.88 1.33(H)  Sodium 135 - 145 mmol/L 139 137 137  Potassium 3.5 - 5.1 mmol/L 3.6 3.4(L) 5.1  Chloride 98 - 111 mmol/L 106 99 104  CO2 22 - 32 mmol/L $RemoveB'28 30 25  'mmkMoghd$ Calcium 8.9 - 10.3 mg/dL 8.9 9.0 8.2(L)  Total Protein 6.5 - 8.1 g/dL 6.8 8.1 6.4(L)  Total Bilirubin 0.3 - 1.2 mg/dL 0.5 0.8 0.7  Alkaline Phos 38 - 126 U/L 69 80 98  AST 15 - 41 U/L 12(L) 18 37  ALT 0 - 44 U/L 10 10 50(H)    Pertinent Imagings/Other Imagings Plain films and CT images have been personally visualized and interpreted; radiology reports have been reviewed. Decision making incorporated into the Impression / Recommendations.  MRI Lumbar spine 01/26/21 FINDINGS: Segmentation:  Standard.  Alignment:  Mild anterolisthesis at L3-L4 and L4-L5.  Vertebrae: There is new irregularity and erosion at the L1-L2 opposing endplates with abnormal marrow signal. Vertebral body heights are otherwise maintained. Background diffusely decreased T1 marrow signal may reflect anemia.  Conus medullaris and cauda equina: Conus extends to the approximately L1 level. Cauda equina unremarkable apart from level of stenosis.  Paraspinal and other soft tissues: Paraspinal edema centered at L1 and L2 with involvement of the psoas muscles. Presence of intradiscal abscess extending into the right psoas is not excluded.  Disc levels:  T12-L1: Mild facet arthropathy.  No stenosis.  L1-L2: T2 hyperintense soft tissue extends from the disc into the ventral  epidural  space and slightly below disc level. Moderate facet arthropathy with ligamentum flavum infolding. Marked canal stenosis. Moderate to marked foraminal stenosis.  L2-L3: Disc bulge. Moderate facet arthropathy with ligamentum flavum infolding. Minor canal stenosis. Minor right and mild left foraminal stenosis.  L3-L4: Anterolisthesis with uncovering of disc bulge. Marked facet arthropathy with ligamentum flavum infolding. Moderate to marked canal stenosis. Mild to moderate foraminal stenosis.  L4-L5: Anterolisthesis with uncovering of disc bulge. Marked facet arthropathy with ligamentum flavum infolding. Moderate canal stenosis. Mild foraminal stenosis.  L5-S1: Marked right and mild left facet arthropathy. No canal or right foraminal stenosis. Mild left foraminal stenosis.  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 Intradiscal abscess extending into the right psoas is not excluded. 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  Multilevel degenerative changes at other levels. Degenerative canal narrowing greatest at L3-L4.  I have spent 60 minutes for this patient encounter including review of prior medical records with greater than 50% of time being face to face and coordination of their care.  Electronically signed by:   Rosiland Oz, MD Infectious Disease Physician [facility] for Infectious Disease Pager: [phone]

## 2021-01-29 NOTE — Progress Notes (Signed)
Subjective: Patient reports that she has had a significant reduction in her pain symptoms. She is doing well. No acute events overnight.   Objective: Vital signs in last 24 hours: Temp:  [97.7 F (36.5 C)-98.5 F (36.9 C)] 97.7 F (36.5 C) (03/28 0507) Pulse Rate:  [59-73] 59 (03/28 0507) Resp:  [16-22] 16 (03/28 0507) BP: (130-175)/(54-79) 130/54 (03/28 0507) SpO2:  [98 %-100 %] 99 % (03/28 0507)  Intake/Output from previous day: 03/27 0701 - 03/28 0700 In: 713.7 [P.O.:240; I.V.:473.7] Out: -  Intake/Output this shift: No intake/output data recorded.  Physical Exam: Patient is awake, A/O X 4, conversant, and in good spirits. Speech is fluent and appropriate. Doing well. MAEW with good strength that is symmetric bilaterally. 5/5 BUE/BLE.    Lab Results: Recent Labs    01/27/21 1833  WBC 10.1  HGB 10.2*  HCT 32.4*  PLT 344   BMET Recent Labs    01/27/21 1833  NA 137  K 3.4*  CL 99  CO2 30  GLUCOSE 109*  BUN 14  CREATININE 0.88  CALCIUM 9.0    Studies/Results: CT ASPIRATION  Result Date: 01/28/2021 INDICATION: 70 year old with evidence for discitis at L1-L2. Request for fluid sampling. EXAM: CT-GUIDED ASPIRATION OF THE L1-L2 DISC SPACE AND L1-L2 PARASPINAL SOFT TISSUE MEDICATIONS: Moderate sedation ANESTHESIA/SEDATION: Fentanyl 50 mcg IV; Versed 1.0 mg IV Moderate Sedation Time:  35 minutes The patient was continuously monitored during the procedure by the interventional radiology nurse under my direct supervision. COMPLICATIONS: None immediate. PROCEDURE: Informed written consent was obtained from the patient after a thorough discussion of the procedural risks, benefits and alternatives. All questions were addressed. A timeout was performed prior to the initiation of the procedure. Patient was placed prone on the CT scanner. Images through the thoracolumbar spine were obtained. L1-L2 disc space was identified. The back was prepped with chlorhexidine and sterile field  was created. Right paraspinal region was anesthetized using 1% lidocaine. Using CT guidance, 18 gauge trocar needle was directed into the right paraspinal region at L1-L2 into the right psoas muscle. Only a small amount of bloody fluid could be obtained after the needle was withdrawn under suction. This fluid was combined with a small amount of sterile saline. Attention was directed to placing a needle in the disc space along the left side. Additional local anesthetic was used. A new 18 gauge trocar needle was directed into the lateral aspect of the left L1-L2 disc space using CT guidance. Needle position confirmed along the lateral aspect of the disc space. Only a small amount of bloody fluid could be aspirated as the needle was withdrawn under suction. Both of these samples were sent for culture. Bandages were placed at the puncture sites. FINDINGS: Again noted is marked irregularity of the L1-L2 disc space. 18 gauge needle was directed along the right psoas muscle at L1-L2 and a small amount of bloody fluid was removed. 18 gauge needle was directed into the left lateral aspect of the L1-L2 disc space and only a small amount of bloody fluid was obtained. IMPRESSION: CT-guided aspiration the L1-L2 disc space and right paraspinal tissue. Fluid was sent for culture. Electronically Signed   By: Richarda Overlie M.D.   On: 01/28/2021 13:35    Assessment/Plan: 70 year old female with severe and intractable low back pain that radiates into her bilateral buttocks and into her bilateral posterior thighs. MRI imaging revealed discitis/osteomyelitis at L2. CT guided aspiration of paraspinal area and disc at L1-L2 completed yesterday. Recommend ID consult.  Ok to begin empiric broad spectrum antibiotics per ID while awaiting culture results. Continue working on pain control.    LOS: 2 days     Council Mechanic, DNP, NP-C 01/29/2021, 8:13 AM

## 2021-01-29 NOTE — Progress Notes (Addendum)
Cassidy Austin  FKC:[id number] DOB: 10-23-1951 DOA: 01/27/2021 PCP: Laurann Montana, MD    Brief Narrative:  70yo with a hx of HTN., nephrolithiasis, and hypothyroidism who has been suffering with intractable low back pain since February 2022.  She required hospitalization in late February when she was found to be septic with pyelonephritis.  She was discharged to a rehab facility but but her low back pain has persisted despite the use of prescription medications.  An outpatient MRI of the lumbar spine 3/25 (obtained after outpt NS referral) revealed evidence concerning for lumbar discitis and osteomyelitis and the patient was therefore directed to the ER for admission.  Significant Events:  2/1-07/2021 admit for AKI, Proteus & E coli UTI w/ sepsis, nephrolithiasis  Antimicrobials:  None for now  DVT prophylaxis: SCDs  Consultants:  Neurosurgery IR ID-pending  Code Status: FULL CODE  Subjective: Afebrile.  Vital signs stable.  No helpful culture data thus far.  The patient is in very good spirits today with a big smile on her face.  She reports significant improvement in her pain since her admission and after aspiration in IR yesterday.  She denies nausea or vomiting at present.  No chest pain or shortness of breath.  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 - intractable low back pain Neurosurgery following - IR completed aspiration of the infected area 3/27 with cultures pending - ID consulted to advise on choice of empiric antibiotic while awaiting culture data -no focal neurologic signs today  Nephrolithiasis 5 mm nonobstructive ureteric calculi & 3 mm right VUJcalculi that is probably obstructive noted on CT 12/11/20 - was to see Urology as outpt -asymptomatic presently  Recent pan-sensitive E coli + Proteus pyelonephritis w/ sepsis  Blood cultures negative during that admission - completed 14 days of abx tx    Mild hypokalemia Improved with supplementation  -magnesium normal  Normocytic anemia Likely due to smoldering infection - anemia panel pending   HTN Pressure well controlled  Hypothyroidism Continue usual Synthroid dose  Severe malnutrition in context of acute illness/injury   Family Communication: No family present at time of exam Status is: Inpatient  Remains inpatient appropriate because:Inpatient level of care appropriate due to severity of illness   Dispo: The patient is from: SNF              Anticipated d/c is to: unclear              Patient currently is not medically stable to d/c.   Difficult to place patient No   Objective: Blood pressure (!) 126/49, pulse 68, temperature 98.7 F (37.1 C), temperature source Oral, resp. rate 16, height 5\' 6"  (1.676 m), weight 114.4 kg, SpO2 98 %.  Intake/Output Summary (Last 24 hours) at 01/29/2021 0929 Last data filed at 01/28/2021 1500 Gross per 24 hour  Intake 713.71 ml  Output --  Net 713.71 ml   Filed Weights   01/27/21 2215  Weight: 114.4 kg    Examination: General: No acute respiratory distress - intact sensation B LE - 4+/5 strength B LE  Lungs: Clear to auscultation bilaterally without wheezes or crackles Cardiovascular: Regular rate and rhythm  Abdomen: NT/ND, soft, no rebound, BS positive Extremities: No signif edema bilateral lower extremities  CBC: Recent Labs  Lab 01/27/21 1833 01/29/21 0840  WBC 10.1 6.8  NEUTROABS 6.9  --   HGB 10.2* 9.4*  HCT 32.4* 29.1*  MCV 87.1 87.4  PLT 344 267   Basic Metabolic Panel: Recent Labs  Lab 01/27/21 1833  NA 137  K 3.4*  CL 99  CO2 30  GLUCOSE 109*  BUN 14  CREATININE 0.88  CALCIUM 9.0   GFR: Estimated Creatinine Clearance: 75.3 mL/min (by C-G formula based on SCr of 0.88 mg/dL).  Liver Function Tests: Recent Labs  Lab 01/27/21 1833  AST 18  ALT 10  ALKPHOS 80  BILITOT 0.8  PROT 8.1  ALBUMIN 3.1*    Recent Results (from the past 240 hour(s))  Culture, blood (routine x 2)     Status:  None (Preliminary result)   Collection Time: 01/27/21  6:33 PM   Specimen: BLOOD  Result Value Ref Range Status   Specimen Description BLOOD LEFT ANTECUBITAL  Final   Special Requests   Final    BOTTLES DRAWN AEROBIC AND ANAEROBIC Blood Culture adequate volume   Culture   Final    NO GROWTH 2 DAYS Performed at [facility], [address], [place], [place] [address]    Report Status PENDING  Incomplete  Resp Panel by RT-PCR (Flu A&B, Covid) Nasopharyngeal Swab     Status: None   Collection Time: 01/27/21  6:33 PM   Specimen: Nasopharyngeal Swab; Nasopharyngeal(NP) swabs in vial transport medium  Result Value Ref Range Status   SARS Coronavirus 2 by RT PCR NEGATIVE NEGATIVE Final    Comment: (NOTE) SARS-CoV-2 target nucleic acids are NOT DETECTED.  The SARS-CoV-2 RNA is generally detectable in upper respiratory specimens during the acute phase of infection. The lowest concentration of SARS-CoV-2 viral copies this assay can detect is 138 copies/mL. A negative result does not preclude SARS-Cov-2 infection and should not be used as the sole basis for treatment or other patient management decisions. A negative result may occur with  improper specimen collection/handling, submission of specimen other than nasopharyngeal swab, presence of viral mutation(s) within the areas targeted by this assay, and inadequate number of viral copies(<138 copies/mL). A negative result must be combined with clinical observations, patient history, and epidemiological information. The expected result is Negative.  Fact Sheet for Patients:  BloggerCourse.com  Fact Sheet for Healthcare Providers:  SeriousBroker.it  This test is no t yet approved or cleared by the [place] FDA and  has been authorized for detection and/or diagnosis of SARS-CoV-2 by FDA under an Emergency Use Authorization (EUA). This EUA will remain  in effect (meaning this test  can be used) for the duration of the COVID-19 declaration under Section 564(b)(1) of the Act, 21 U.S.C.section 360bbb-3(b)(1), unless the authorization is terminated  or revoked sooner.       Influenza A by PCR NEGATIVE NEGATIVE Final   Influenza B by PCR NEGATIVE NEGATIVE Final    Comment: (NOTE) The Xpert Xpress SARS-CoV-2/FLU/RSV plus assay is intended as an aid in the diagnosis of influenza from Nasopharyngeal swab specimens and should not be used as a sole basis for treatment. Nasal washings and aspirates are unacceptable for Xpert Xpress SARS-CoV-2/FLU/RSV testing.  Fact Sheet for Patients: BloggerCourse.com  Fact Sheet for Healthcare Providers: SeriousBroker.it  This test is not yet approved or cleared by the [place] FDA and has been authorized for detection and/or diagnosis of SARS-CoV-2 by FDA under an Emergency Use Authorization (EUA). This EUA will remain in effect (meaning this test can be used) for the duration of the COVID-19 declaration under Section 564(b)(1) of the Act, 21 U.S.C. section 360bbb-3(b)(1), unless the authorization is terminated or revoked.  Performed at [facility], [address] [place],  [place] [address]   Urine culture     Status: Abnormal   Collection Time: 01/27/21  6:33 PM   Specimen: Urine, Random  Result Value Ref Range Status   Specimen Description URINE, RANDOM  Final   Special Requests   Final    NONE Performed at [facility], [address] [place], [place] [address]    Culture MULTIPLE SPECIES PRESENT, SUGGEST RECOLLECTION (A)  Final   Report Status 01/29/2021 FINAL  Final  Aerobic/Anaerobic Culture (surgical/deep wound)     Status: None (Preliminary result)   Collection Time: 01/28/21 10:47 AM   Specimen: Fluid; Tissue  Result Value Ref Range Status   Specimen Description FLUID L1/L2 INTERVERTEBRAL DISC AND PARASPINAL  Final   Special Requests NONE   Final   Gram Stain   Final    MODERATE WBC PRESENT, PREDOMINANTLY PMN NO ORGANISMS SEEN Performed at [facility], [address], [place], [place] [address]    Culture PENDING  Incomplete   Report Status PENDING  Incomplete     Scheduled Meds: . carvedilol  12.5 mg Oral BID  . cholecalciferol  1,000 Units Oral BID  . ezetimibe  10 mg Oral Daily  . levothyroxine  100 mcg Oral Q0600  . tamsulosin  0.4 mg Oral QPC breakfast   Continuous Infusions: . sodium chloride 75 mL/hr at 01/29/21 0018  . methocarbamol (ROBAXIN) IV 500 mg (01/29/21 0023)     LOS: 2 days   Lonia Blood, MD [facility] Office  [phone] Pager - Text Page per Amion  If 7PM-7AM, please contact night-coverage per Amion 01/29/2021, 9:29 AM

## 2021-01-29 NOTE — Plan of Care (Signed)
Patient started daptomycin and rocephin today per ID recommendations. Pain being managed with prn meds as ordered + new prn muscle spasm medication skelaxin. Fluid cx from CT guided aspiration still pending. Will continue to monitor and continue current POC.

## 2021-01-30 ENCOUNTER — Inpatient Hospital Stay: Payer: Self-pay

## 2021-01-30 DIAGNOSIS — M4646 Discitis, unspecified, lumbar region: Secondary | ICD-10-CM

## 2021-01-30 LAB — COMPREHENSIVE METABOLIC PANEL
ALT: 10 U/L (ref 0–44)
AST: 10 U/L — ABNORMAL LOW (ref 15–41)
Albumin: 2.4 g/dL — ABNORMAL LOW (ref 3.5–5.0)
Alkaline Phosphatase: 65 U/L (ref 38–126)
Anion gap: 8 (ref 5–15)
BUN: 9 mg/dL (ref 8–23)
CO2: 25 mmol/L (ref 22–32)
Calcium: 8.5 mg/dL — ABNORMAL LOW (ref 8.9–10.3)
Chloride: 106 mmol/L (ref 98–111)
Creatinine, Ser: 0.67 mg/dL (ref 0.44–1.00)
GFR, Estimated: >60 mL/min (ref >60)
Glucose, Bld: 119 mg/dL — ABNORMAL HIGH (ref 70–99)
Potassium: 3.7 mmol/L (ref 3.5–5.1)
Sodium: 139 mmol/L (ref 135–145)
Total Bilirubin: 0.4 mg/dL (ref 0.3–1.2)
Total Protein: 6.2 g/dL — ABNORMAL LOW (ref 6.5–8.1)

## 2021-01-30 LAB — CBC
HCT: 28 % — ABNORMAL LOW (ref 36.0–46.0)
Hemoglobin: 8.9 g/dL — ABNORMAL LOW (ref 12.0–15.0)
MCH: 27.9 pg (ref 26.0–34.0)
MCHC: 31.8 g/dL (ref 30.0–36.0)
MCV: 87.8 fL (ref 80.0–100.0)
Platelets: 245 10*3/uL (ref 150–400)
RBC: 3.19 MIL/uL — ABNORMAL LOW (ref 3.87–5.11)
RDW: 16.3 % — ABNORMAL HIGH (ref 11.5–15.5)
WBC: 7.8 10*3/uL (ref 4.0–10.5)
nRBC: 0 % (ref 0.0–0.2)

## 2021-01-30 LAB — C-REACTIVE PROTEIN: CRP: 5.1 mg/dL — ABNORMAL HIGH (ref <1.0)

## 2021-01-30 LAB — SEDIMENTATION RATE: Sed Rate: 72 mm/hr — ABNORMAL HIGH (ref 0–22)

## 2021-01-30 MED ORDER — HYDROMORPHONE HCL 1 MG/ML IJ SOLN
0.5000 mg | INTRAMUSCULAR | Status: DC | PRN
  Administered 2021-01-30 – 2021-02-05 (×5): 1 mg via INTRAVENOUS
  Filled 2021-01-30 (×5): qty 1

## 2021-01-30 NOTE — Progress Notes (Signed)
Subjective: Patient reports more low back pain yesterday than the day before. Pain seems to be responding well to analgesics. No other complaints. No acute events overnight.   Objective: Vital signs in last 24 hours: Temp:  [97.9 F (36.6 C)-98.7 F (37.1 C)] 97.9 F (36.6 C) (03/29 0728) Pulse Rate:  [55-68] 55 (03/29 0728) Resp:  [16-18] 17 (03/29 0728) BP: (112-149)/(44-68) 112/61 (03/29 0728) SpO2:  [94 %-99 %] 98 % (03/29 0728)  Intake/Output from previous day: 03/28 0701 - 03/29 0700 In: 150 [IV Piggyback:150] Out: -  Intake/Output this shift: No intake/output data recorded.  Physical Exam: Patient is awake, A/O X 4, conversant, and in NAD. Speech is fluent and appropriate. MAEW with good strength that is symmetric bilaterally. 5/5 BUE/BLE. Does report an increase in her low back pain.  Lab Results: Recent Labs    01/29/21 0840 01/30/21 0236  WBC 6.8 7.8  HGB 9.4* 8.9*  HCT 29.1* 28.0*  PLT 267 245   BMET Recent Labs    01/29/21 0840 01/30/21 0236  NA 139 139  K 3.6 3.7  CL 106 106  CO2 28 25  GLUCOSE 124* 119*  BUN 10 9  CREATININE 0.83 0.67  CALCIUM 8.9 8.5*    Studies/Results: CT ASPIRATION  Result Date: 01/28/2021 INDICATION: 70 year old with evidence for discitis at L1-L2. Request for fluid sampling. EXAM: CT-GUIDED ASPIRATION OF THE L1-L2 DISC SPACE AND L1-L2 PARASPINAL SOFT TISSUE MEDICATIONS: Moderate sedation ANESTHESIA/SEDATION: Fentanyl 50 mcg IV; Versed 1.0 mg IV Moderate Sedation Time:  35 minutes The patient was continuously monitored during the procedure by the interventional radiology nurse under my direct supervision. COMPLICATIONS: None immediate. PROCEDURE: Informed written consent was obtained from the patient after a thorough discussion of the procedural risks, benefits and alternatives. All questions were addressed. A timeout was performed prior to the initiation of the procedure. Patient was placed prone on the CT scanner. Images through  the thoracolumbar spine were obtained. L1-L2 disc space was identified. The back was prepped with chlorhexidine and sterile field was created. Right paraspinal region was anesthetized using 1% lidocaine. Using CT guidance, 18 gauge trocar needle was directed into the right paraspinal region at L1-L2 into the right psoas muscle. Only a small amount of bloody fluid could be obtained after the needle was withdrawn under suction. This fluid was combined with a small amount of sterile saline. Attention was directed to placing a needle in the disc space along the left side. Additional local anesthetic was used. A new 18 gauge trocar needle was directed into the lateral aspect of the left L1-L2 disc space using CT guidance. Needle position confirmed along the lateral aspect of the disc space. Only a small amount of bloody fluid could be aspirated as the needle was withdrawn under suction. Both of these samples were sent for culture. Bandages were placed at the puncture sites. FINDINGS: Again noted is marked irregularity of the L1-L2 disc space. 18 gauge needle was directed along the right psoas muscle at L1-L2 and a small amount of bloody fluid was removed. 18 gauge needle was directed into the left lateral aspect of the L1-L2 disc space and only a small amount of bloody fluid was obtained. IMPRESSION: CT-guided aspiration the L1-L2 disc space and right paraspinal tissue. Fluid was sent for culture. Electronically Signed   By: Richarda Overlie M.D.   On: 01/28/2021 13:35    Assessment/Plan: 70 year old female withsevere and intractable low back pain that radiates into her bilateral buttocks and into her  bilateral posterior thighs.MRI imaging revealed discitis/osteomyelitisat L2. CT guided aspiration of paraspinal area and disc at L1-L2 completed and awaiting culture results. ID consulted. Patient started on empiric Daptomycin and ceftriaxone. Continue working on pain control. Call with any questions.   LOS: 3 days      Council Mechanic, DNP, NP-C 01/30/2021, 7:48 AM

## 2021-01-30 NOTE — Progress Notes (Addendum)
Cassidy Austin  NLZ:[id number] DOB: 09-09-1951 DOA: 01/27/2021 PCP: Laurann Montana, MD    Brief Narrative:  70yo with a hx of HTN., nephrolithiasis, and hypothyroidism who has been suffering with intractable low back pain since February 2022.  She required hospitalization in late February when she was found to be septic with pyelonephritis.  She was discharged to a rehab facility but but her low back pain has persisted despite the use of prescription medications.  An outpatient MRI of the lumbar spine 3/25 (obtained after outpt NS referral) revealed evidence concerning for lumbar discitis and osteomyelitis and the patient was therefore directed to the ER for admission.  Significant Events:  2/1-07/2021 admit for AKI, Proteus & E coli UTI w/ sepsis, nephrolithiasis 3/27 CT guided aspiration of paraspinal area and disc at L1-L2  Antimicrobials:  None for now  DVT prophylaxis: SCDs - consider adding lovenox 3/30  Consultants:  Neurosurgery IR ID-pending  Code Status: FULL CODE  Subjective: Afebrile.  Vital signs stable.  No new culture data thus far.  Blood cultures remain negative.  Resting comfortably in bed reporting pain is well controlled.  Smiling and engaging.  Denies shortness of breath or chest pain.  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 - intractable low back pain Neurosurgery following - IR completed aspiration of the infected area 3/27 with cultures pending - ID directing antibiotic therapy -PICC line to be placed for anticipated long-term antibiotic therapy  Nephrolithiasis 5 mm nonobstructive ureteric calculi & 3 mm right VUJcalculi that is probably obstructive noted on CT 12/11/20 - was to see Urology as outpt -asymptomatic presently  Recent pan-sensitive E coli + Proteus pyelonephritis w/ sepsis  Blood cultures negative during that admission - completed 14 days of abx tx    Mild hypokalemia Improved with supplementation -magnesium  normal  Normocytic anemia Likely due to smoldering infection - anemia panel consistent with this  HTN Pressure well controlled  Hypothyroidism Continue usual Synthroid dose  Severe malnutrition in context of acute illness/injury   Family Communication: No family present at time of exam Status is: Inpatient  Remains inpatient appropriate because:Inpatient level of care appropriate due to severity of illness   Dispo: The patient is from: SNF              Anticipated d/c is to: unclear              Patient currently is not medically stable to d/c.   Difficult to place patient No   Objective: Blood pressure 112/61, pulse (!) 55, temperature 97.9 F (36.6 C), temperature source Oral, resp. rate 17, height 5\' 6"  (1.676 m), weight 114.4 kg, SpO2 98 %.  Intake/Output Summary (Last 24 hours) at 01/30/2021 1038 Last data filed at 01/29/2021 1822 Gross per 24 hour  Intake 150 ml  Output --  Net 150 ml   Filed Weights   01/27/21 2215  Weight: 114.4 kg    Examination: General: No acute respiratory distress  Lungs: Clear to auscultation bilaterally -no wheeze Cardiovascular: Regular rate and rhythm -no murmur Abdomen: NT/ND, soft, no rebound, BS positive Extremities: No signif edema bilateral LE  CBC: Recent Labs  Lab 01/27/21 1833 01/29/21 0840 01/30/21 0236  WBC 10.1 6.8 7.8  NEUTROABS 6.9  --   --   HGB 10.2* 9.4* 8.9*  HCT 32.4* 29.1* 28.0*  MCV 87.1 87.4 87.8  PLT 344 267 245   Basic Metabolic Panel: Recent Labs  Lab 01/27/21 1833 01/29/21 0840 01/30/21 0236  NA 137  139 139  K 3.4* 3.6 3.7  CL 99 106 106  CO2 30 28 25   GLUCOSE 109* 124* 119*  BUN 14 10 9   CREATININE 0.88 0.83 0.67  CALCIUM 9.0 8.9 8.5*  MG  --  2.0  --    GFR: Estimated Creatinine Clearance: 82.8 mL/min (by C-G formula based on SCr of 0.67 mg/dL).  Liver Function Tests: Recent Labs  Lab 01/27/21 1833 01/29/21 0840 01/30/21 0236  AST 18 12* 10*  ALT 10 10 10   ALKPHOS 80 69  65  BILITOT 0.8 0.5 0.4  PROT 8.1 6.8 6.2*  ALBUMIN 3.1* 2.6* 2.4*    Recent Results (from the past 240 hour(s))  Culture, blood (routine x 2)     Status: None (Preliminary result)   Collection Time: 01/27/21  6:33 PM   Specimen: BLOOD  Result Value Ref Range Status   Specimen Description BLOOD LEFT ANTECUBITAL  Final   Special Requests   Final    BOTTLES DRAWN AEROBIC AND ANAEROBIC Blood Culture adequate volume   Culture   Final    NO GROWTH 3 DAYS Performed at [facility], [address] [place], [facility] [address]    Report Status PENDING  Incomplete  Resp Panel by RT-PCR (Flu A&B, Covid) Nasopharyngeal Swab     Status: None   Collection Time: 01/27/21  6:33 PM   Specimen: Nasopharyngeal Swab; Nasopharyngeal(NP) swabs in vial transport medium  Result Value Ref Range Status   SARS Coronavirus 2 by RT PCR NEGATIVE NEGATIVE Final    Comment: (NOTE) SARS-CoV-2 target nucleic acids are NOT DETECTED.  The SARS-CoV-2 RNA is generally detectable in upper respiratory specimens during the acute phase of infection. The lowest concentration of SARS-CoV-2 viral copies this assay can detect is 138 copies/mL. A negative result does not preclude SARS-Cov-2 infection and should not be used as the sole basis for treatment or other patient management decisions. A negative result may occur with  improper specimen collection/handling, submission of specimen other than nasopharyngeal swab, presence of viral mutation(s) within the areas targeted by this assay, and inadequate number of viral copies(<138 copies/mL). A negative result must be combined with clinical observations, patient history, and epidemiological information. The expected result is Negative.  Fact Sheet for Patients:  [place]  Fact Sheet for Healthcare Providers:  [address]  This test is no t yet approved or cleared by the 01/29/21 FDA and  has  been authorized for detection and/or diagnosis of SARS-CoV-2 by FDA under an Emergency Use Authorization (EUA). This EUA will remain  in effect (meaning this test can be used) for the duration of the COVID-19 declaration under Section 564(b)(1) of the Act, 21 U.S.C.section 360bbb-3(b)(1), unless the authorization is terminated  or revoked sooner.       Influenza A by PCR NEGATIVE NEGATIVE Final   Influenza B by PCR NEGATIVE NEGATIVE Final    Comment: (NOTE) The Xpert Xpress SARS-CoV-2/FLU/RSV plus assay is intended as an aid in the diagnosis of influenza from Nasopharyngeal swab specimens and should not be used as a sole basis for treatment. Nasal washings and aspirates are unacceptable for Xpert Xpress SARS-CoV-2/FLU/RSV testing.  Fact Sheet for Patients: BloggerCourse.com  Fact Sheet for Healthcare Providers: SeriousBroker.it  This test is not yet approved or cleared by the [place] FDA and has been authorized for detection and/or diagnosis of SARS-CoV-2 by FDA under an Emergency Use Authorization (EUA). This EUA will remain in effect (meaning this test can be used) for  the duration of the COVID-19 declaration under Section 564(b)(1) of the Act, 21 U.S.C. section 360bbb-3(b)(1), unless the authorization is terminated or revoked.  Performed at [facility], [address], [place], [place] [address]   Urine culture     Status: Abnormal   Collection Time: 01/27/21  6:33 PM   Specimen: Urine, Random  Result Value Ref Range Status   Specimen Description URINE, RANDOM  Final   Special Requests   Final    NONE Performed at [facility], [address], [place], [place] [address]    Culture MULTIPLE SPECIES PRESENT, SUGGEST RECOLLECTION (A)  Final   Report Status 01/29/2021 FINAL  Final  Aerobic/Anaerobic Culture (surgical/deep wound)     Status: None (Preliminary result)   Collection Time: 01/28/21 10:47 AM    Specimen: Fluid; Tissue  Result Value Ref Range Status   Specimen Description FLUID L1/L2 INTERVERTEBRAL DISC AND PARASPINAL  Final   Special Requests NONE  Final   Gram Stain   Final    MODERATE WBC PRESENT, PREDOMINANTLY PMN NO ORGANISMS SEEN    Culture   Final    CULTURE REINCUBATED FOR BETTER GROWTH Performed at [facility], [address] [place], [place] [address]    Report Status PENDING  Incomplete     Scheduled Meds: . carvedilol  12.5 mg Oral BID  . cholecalciferol  1,000 Units Oral BID  . ezetimibe  10 mg Oral Daily  . feeding supplement  237 mL Oral BID BM  . levothyroxine  100 mcg Oral Q0600  . multivitamin with minerals  1 tablet Oral Daily  . tamsulosin  0.4 mg Oral QPC breakfast   Continuous Infusions: . sodium chloride 50 mL/hr at 01/29/21 1822  . cefTRIAXone (ROCEPHIN)  IV 2 g (01/29/21 1822)  . DAPTOmycin (CUBICIN)  IV 750 mg (01/29/21 2056)     LOS: 3 days   Lonia Blood, MD [facility] Office  [phone] Pager - Text Page per Amion  If 7PM-7AM, please contact night-coverage per Amion 01/30/2021, 10:38 AM

## 2021-01-30 NOTE — NC FL2 (Signed)
[employer] MEDICAID FL2 LEVEL OF CARE SCREENING TOOL     IDENTIFICATION  Patient Name: Cassidy Austin Birthdate: 19-May-1951 Sex: female Admission Date (Current Location): 01/27/2021  [facility] and [place] Number:  Producer, television/film/video and Address:  [facility], [address], [place], [place] [address]      Provider Number: [id number]  Attending Physician Name and Address:  Lonia Blood, MD  Relative Name and Phone Number:  [name] (Relative)   [phone] [facility])    Current Level of Care: Hospital Recommended Level of Care: Skilled Nursing Facility Prior Approval Number:    Date Approved/Denied:   PASRR Number: [id number]  Discharge Plan: SNF    Current Diagnoses: Patient Active Problem List   Diagnosis Date Noted  . Protein-calorie malnutrition, severe 01/29/2021  . Discitis/osteomyelitis of lumbar region 01/27/2021  . Intractable pain 01/27/2021  . Impaired ambulation 01/27/2021  . Pressure injury of skin 12/11/2020  . Chest pain 12/05/2020  . Renal insufficiency 12/05/2020  . Normocytic anemia 12/05/2020  . Thrombocytopenia (HCC) 12/05/2020  . Leukocytosis 12/05/2020  . Acute renal insufficiency   . Low back pain   . Hypothyroidism   . Hypertension   . Complication of anesthesia   . OA (osteoarthritis) of knee   . Hypercholesterolemia   . Goiter     Orientation RESPIRATION BLADDER Height & Weight     Self,Time,Situation,Place  Normal External catheter Weight: 252 lb 3.3 oz (114.4 kg) Height:  5\' 6"  (167.6 cm)  BEHAVIORAL SYMPTOMS/MOOD NEUROLOGICAL BOWEL NUTRITION STATUS      Continent Diet (See d/c summary)  AMBULATORY STATUS COMMUNICATION OF NEEDS Skin   Extensive Assist Verbally Surgical wounds (Skin tear left leg posterior)                       Personal Care Assistance Level of Assistance  Bathing,Feeding,Dressing Bathing Assistance: Limited assistance Feeding assistance: Independent Dressing  Assistance: Limited assistance     Functional Limitations Info  Sight,Hearing,Speech Sight Info: Adequate Hearing Info: Adequate Speech Info: Adequate    SPECIAL CARE FACTORS FREQUENCY  PT (By licensed PT),OT (By licensed OT)     PT Frequency: 5x/week OT Frequency: 5x/week            Contractures Contractures Info: Not present    Additional Factors Info  Code Status,Allergies Code Status Info: Full code Allergies Info: no known allergies           Current Medications (01/30/2021):  This is the current hospital active medication list Current Facility-Administered Medications  Medication Dose Route Frequency Provider Last Rate Last Admin  . 0.9 %  sodium chloride infusion   Intravenous Continuous 02/01/2021, MD 50 mL/hr at 01/29/21 1822 Rate Change at 01/29/21 01/31/21  . acetaminophen (TYLENOL) tablet 650 mg  650 mg Oral Q6H PRN 4709, MD      . carvedilol (COREG) tablet 12.5 mg  12.5 mg Oral BID Andris Baumann, MD   12.5 mg at 01/30/21 0906  . cefTRIAXone (ROCEPHIN) 2 g in sodium chloride 0.9 % 100 mL IVPB  2 g Intravenous Q24H 02/01/21, MD 200 mL/hr at 01/29/21 1822 2 g at 01/29/21 1822  . cholecalciferol (VITAMIN D3) tablet 1,000 Units  1,000 Units Oral BID 01/31/21, MD   1,000 Units at 01/30/21 [phone]  . DAPTOmycin (CUBICIN) 750 mg in sodium chloride 0.9 % IVPB  750 mg Intravenous Q2000 2957, MD 230 mL/hr at 01/29/21  2056 750 mg at 01/29/21 2056  . ezetimibe (ZETIA) tablet 10 mg  10 mg Oral Daily Lonia Blood, MD   10 mg at 01/30/21 0906  . feeding supplement (ENSURE ENLIVE / ENSURE PLUS) liquid 237 mL  237 mL Oral BID BM Lonia Blood, MD   237 mL at 01/29/21 1411  . HYDROmorphone (DILAUDID) injection 0.5-1 mg  0.5-1 mg Intravenous Q2H PRN Benita Gutter L, NP   1 mg at 01/30/21 1046  . levothyroxine (SYNTHROID) tablet 100 mcg  100 mcg Oral Q0600 Lonia Blood, MD   100 mcg at 01/30/21 7680  . metaxalone  (SKELAXIN) tablet 400 mg  400 mg Oral TID PRN Lonia Blood, MD   400 mg at 01/29/21 2050  . multivitamin with minerals tablet 1 tablet  1 tablet Oral Daily Lonia Blood, MD   1 tablet at 01/30/21 8811  . naphazoline-glycerin (CLEAR EYES REDNESS) ophth solution 1-2 drop  1-2 drop Both Eyes QID PRN Lonia Blood, MD      . ondansetron [facility]) tablet 4 mg  4 mg Oral Q6H PRN Andris Baumann, MD       Or  . ondansetron [facility]) injection 4 mg  4 mg Intravenous Q6H PRN Lindajo Royal V, MD      . oxyCODONE (Oxy IR/ROXICODONE) immediate release tablet 5-10 mg  5-10 mg Oral Q4H PRN Lonia Blood, MD   10 mg at 01/30/21 0906  . tamsulosin (FLOMAX) capsule 0.4 mg  0.4 mg Oral QPC breakfast Lonia Blood, MD   0.4 mg at 01/30/21 0906  . traMADol (ULTRAM) tablet 50 mg  50 mg Oral Q6H PRN Lonia Blood, MD   50 mg at 01/29/21 2053     Discharge Medications: Please see discharge summary for a list of discharge medications.  Relevant Imaging Results:  Relevant Lab Results:   Additional Information SSN; [id number]  Erin Sons, LCSW

## 2021-01-30 NOTE — Progress Notes (Signed)
[facility] Infectious Diseases Follow Up Note  Patient Identification: Patient Name: Cassidy Austin MRN: [id number] Admit Date: 01/27/2021  3:09 PM Age: 70 y.o.Today's Date: 01/30/2021   Reason for Visit: Discitis and osteomyelitis  Principal Problem:   Discitis/osteomyelitis of lumbar region Active Problems:   Hypothyroidism   Hypertension   Intractable pain   Impaired ambulation   Protein-calorie malnutrition, severe  Antibiotics: Daptomycin 3/29-c                    Ceftriaxone 3/29-c  Lines/Tubes: PIVs,   Interval Events: afebrile, no leukocytosis, cultures positive for Pan S E coli    Assessment Discitis/osteomyelitis at L1-L2 with paraspinal edema Possible intradiscal abscess extending into the right psoas Status post CT-guided aspiration on 3/27, culture growing pansensitive E. coli   Recommendations DC daptomycin, continue ceftriaxone for now Plan to treat for 6 to 8 weeks with IV antibiotics PICC line ( ordered). Blood cx negative in 3 days  Will monitor cultures for adjusting antibiotics as needed   Monitor CBC and CMP on IV antibiotics Patient has an appointment with myself on April 14 at 11:30 AM  Rest of the management as per the primary team. Thank you for the consult. Please page with pertinent questions or concerns.  ______________________________________________________________________ Subjective patient seen and examined at the bedside.  Resting in bed comfortably.  Lower back pain is stable. Denies nausea vomiting and diarrhea Denies rashes Discussed with her about PICC line placement and IV antibiotics   Vitals BP (!) 121/59 (BP Location: Left Arm)   Pulse 65   Temp (!) 97.4 F (36.3 C) (Oral)   Resp 17   Ht 5\' 6"  (1.676 m)   Wt 114.4 kg   SpO2 100%   BMI 40.71 kg/m     Physical Exam Constitutional:   Resting in bed comfortably, not in acute distress    Comments:    Cardiovascular:     Rate and Rhythm: Normal rate and regular rhythm.     Heart sounds:   Pulmonary:     Effort: Pulmonary effort is normal.     Comments:   Abdominal:     Palpations: Abdomen is soft.     Tenderness:   Musculoskeletal:        General: No swelling or tenderness.   Skin:    Comments: No obvious rashes  Neurological:     General: No focal deficit present.   Psychiatric:        Mood and Affect: Mood normal.   Pertinent Microbiology Results for orders placed or performed during the hospital encounter of 01/27/21  Culture, blood (routine x 2)     Status: None (Preliminary result)   Collection Time: 01/27/21  6:33 PM   Specimen: BLOOD  Result Value Ref Range Status   Specimen Description BLOOD LEFT ANTECUBITAL  Final   Special Requests   Final    BOTTLES DRAWN AEROBIC AND ANAEROBIC Blood Culture adequate volume   Culture   Final    NO GROWTH 3 DAYS Performed at [facility], [address] [place], [place]    Report Status PENDING  Incomplete  Resp Panel by RT-PCR (Flu A&B, Covid) Nasopharyngeal Swab     Status: None   Collection Time: 01/27/21  6:33 PM   Specimen: Nasopharyngeal Swab; Nasopharyngeal(NP) swabs in vial transport medium  Result Value Ref Range Status   SARS Coronavirus 2 by RT PCR NEGATIVE NEGATIVE Final    Comment: (NOTE) SARS-CoV-2 target  nucleic acids are NOT DETECTED.  The SARS-CoV-2 RNA is generally detectable in upper respiratory specimens during the acute phase of infection. The lowest concentration of SARS-CoV-2 viral copies this assay can detect is 138 copies/mL. A negative result does not preclude SARS-Cov-2 infection and should not be used as the sole basis for treatment or other patient management decisions. A negative result may occur with  improper specimen collection/handling, submission of specimen other than nasopharyngeal swab, presence of viral mutation(s) within the areas targeted by this assay, and  inadequate number of viral copies(<138 copies/mL). A negative result must be combined with clinical observations, patient history, and epidemiological information. The expected result is Negative.  Fact Sheet for Patients:  BloggerCourse.com  Fact Sheet for Healthcare Providers:  SeriousBroker.it  This test is no t yet approved or cleared by the [place] FDA and  has been authorized for detection and/or diagnosis of SARS-CoV-2 by FDA under an Emergency Use Authorization (EUA). This EUA will remain  in effect (meaning this test can be used) for the duration of the COVID-19 declaration under Section 564(b)(1) of the Act, 21 U.S.C.section 360bbb-3(b)(1), unless the authorization is terminated  or revoked sooner.       Influenza A by PCR NEGATIVE NEGATIVE Final   Influenza B by PCR NEGATIVE NEGATIVE Final    Comment: (NOTE) The Xpert Xpress SARS-CoV-2/FLU/RSV plus assay is intended as an aid in the diagnosis of influenza from Nasopharyngeal swab specimens and should not be used as a sole basis for treatment. Nasal washings and aspirates are unacceptable for Xpert Xpress SARS-CoV-2/FLU/RSV testing.  Fact Sheet for Patients: BloggerCourse.com  Fact Sheet for Healthcare Providers: SeriousBroker.it  This test is not yet approved or cleared by the [place] FDA and has been authorized for detection and/or diagnosis of SARS-CoV-2 by FDA under an Emergency Use Authorization (EUA). This EUA will remain in effect (meaning this test can be used) for the duration of the COVID-19 declaration under Section 564(b)(1) of the Act, 21 U.S.C. section 360bbb-3(b)(1), unless the authorization is terminated or revoked.  Performed at [facility], [address] [place], [place] [address]   Urine culture     Status: Abnormal   Collection Time: 01/27/21  6:33 PM   Specimen: Urine,  Random  Result Value Ref Range Status   Specimen Description URINE, RANDOM  Final   Special Requests   Final    NONE Performed at [facility], [address], [place], [place] [address]    Culture MULTIPLE SPECIES PRESENT, SUGGEST RECOLLECTION (A)  Final   Report Status 01/29/2021 FINAL  Final  Aerobic/Anaerobic Culture (surgical/deep wound)     Status: None (Preliminary result)   Collection Time: 01/28/21 10:47 AM   Specimen: Fluid; Tissue  Result Value Ref Range Status   Specimen Description FLUID L1/L2 INTERVERTEBRAL DISC AND PARASPINAL  Final   Special Requests NONE  Final   Gram Stain   Final    MODERATE WBC PRESENT, PREDOMINANTLY PMN NO ORGANISMS SEEN Performed at [facility], [address], [place], [place] [address]    Culture   Final    RARE ESCHERICHIA COLI NO ANAEROBES ISOLATED; CULTURE IN PROGRESS FOR 5 DAYS    Report Status PENDING  Incomplete   Organism ID, Bacteria ESCHERICHIA COLI  Final      Susceptibility   Escherichia coli - MIC*    AMPICILLIN 4 SENSITIVE Sensitive     CEFAZOLIN <=4 SENSITIVE Sensitive     CEFEPIME <=  0.12 SENSITIVE Sensitive     CEFTAZIDIME <=1 SENSITIVE Sensitive     CEFTRIAXONE <=0.25 SENSITIVE Sensitive     CIPROFLOXACIN <=0.25 SENSITIVE Sensitive     GENTAMICIN <=1 SENSITIVE Sensitive     IMIPENEM <=0.25 SENSITIVE Sensitive     TRIMETH/SULFA <=20 SENSITIVE Sensitive     AMPICILLIN/SULBACTAM <=2 SENSITIVE Sensitive     PIP/TAZO <=4 SENSITIVE Sensitive     * RARE ESCHERICHIA COLI   Pertinent Lab. CBC Latest Ref Rng & Units 01/30/2021 01/29/2021 01/27/2021  WBC 4.0 - 10.5 K/uL 7.8 6.8 10.1  Hemoglobin 12.0 - 15.0 g/dL 1.6(X) 0.9(U) 10.2(L)  Hematocrit 36.0 - 46.0 % 28.0(L) 29.1(L) 32.4(L)  Platelets 150 - 400 K/uL 245 267 344   CMP Latest Ref Rng & Units 01/30/2021 01/29/2021 01/27/2021  Glucose 70 - 99 mg/dL 045(W) 098(J) 191(Y)  BUN 8 - 23 mg/dL 9 10 14   Creatinine 0.44 - 1.00 mg/dL 7.82 9.56  Sodium 135 - 145  mmol/L 139 139 137  Potassium 3.5 - 5.1 mmol/L 3.7 3.6 3.4(L)  Chloride 98 - 111 mmol/L 106 106 99  CO2 22 - 32 mmol/L 25 28 30   Calcium 8.9 - 10.3 mg/dL 2.13) 8.9 9.0  Total Protein 6.5 - 8.1 g/dL 6.2(L) 6.8 8.1  Total Bilirubin 0.3 - 1.2 mg/dL 0.4 0.5 0.8  Alkaline Phos 38 - 126 U/L 65 69 80  AST 15 - 41 U/L 10(L) 12(L) 18  ALT 0 - 44 U/L 10 10 10      Pertinent Imaging today Plain films and CT images have been personally visualized and interpreted; radiology reports have been reviewed. Decision making incorporated into the Impression / Recommendations.  I have spent approx 30 minutes for this patient encounter including review of prior medical records, coordination of care  with greater than 50% of time being face to face/counseling and discussing diagnostics/treatment plan with the patient/family.  Electronically signed by:   , MD Infectious Disease Physician [facility] Infect[facility] Pager: [phone]

## 2021-01-30 NOTE — TOC Initial Note (Addendum)
Transition of Care [facility]) - Initial/Assessment Note    Patient Details  Name: Cassidy Austin MRN: [id number] Date of Birth: [birth year]  Transition [facility]) CM/SW Contact:    Cassidy Lesches, RN Phone Number: 01/30/2021, 1:01 PM  Clinical Narrative:                 Readmitted with lumbar discitis L1-L2  and osteomyelitis. Previous admit 12/05/2020 - 12/13/2020 for chest pain/AKI and E. coli UTI. From [facility]. Pt states prior to SNF visit was home alone. Already has W/C. States would like a BSC  @ D/C.  Pt states @ d/c she will reside with nephew  Cassidy Austin. Nephew's home : [address], [place].  Per ID anticipate  6-8 weeks of IV antibiotic therapy. Cultures pending... Pt agreeable to home health services. Choice provided. Pt without choice for [facility] agency. States nephew to assist with care.  Referral made with  [facility] Cassidy Austin for IV ABX therapy and [facility] for RN.  Home health teaching per Cassidy Austin / [facility] pending per home  medication regimen ... cultures pending.  TOC will continue to monitor and follow for TOC needs.   Expected Discharge Plan: Home w Home Health Services Barriers to Discharge: Continued Medical Work up   Patient Goals and CMS Choice Patient states their goals for this hospitalization and ongoing recovery are:: get to my nephew's home and get better   Choice offered to / list presented to : Patient  Expected Discharge Plan and Services Expected Discharge Plan: Home w Home Health Services   Discharge Planning Services: CM Consult   Living arrangements for the past 2 months: Skilled Nursing Facility                 DME Arranged: Other see comment ([facility]) DME Agency: Other - Comment Date DME Agency Contacted: 01/30/21 Time DME Agency Contacted: [phone] Representative spoke with at DME Agency: Cassidy Austin HH Arranged: RN HH Agency: [facility] Date [facility] Agency Contacted: 01/30/21 Time HH Agency  Contacted: [id number] Representative spoke with at Cassidy Austin [facility] Agency: Cassidy Austin  Prior Living Arrangements/Services Living arrangements for the past 2 months: Skilled Nursing Facility Lives with:: Other (Comment) (d/c plan: nephew's home [address] [place]) Patient language and need for interpreter reviewed:: Yes Do you feel safe going back to the place where you live?: Yes      Need for Family Participation in Patient Care: Yes (Comment) Care giver support system in place?: Yes (comment)   Criminal Activity/Legal Involvement Pertinent to Current Situation/Hospitalization: No - Comment as needed  Activities of Daily Living Home Assistive Devices/Equipment: None ADL Screening (condition at time of admission) Patient's cognitive ability adequate to safely complete daily activities?: Yes Is the patient deaf or have difficulty hearing?: No Does the patient have difficulty seeing, even when wearing glasses/contacts?: No Does the patient have difficulty concentrating, remembering, or making decisions?: No Patient able to express need for assistance with ADLs?: Yes Does the patient have difficulty dressing or bathing?: Yes Independently performs ADLs?: Yes (appropriate for developmental age) Does the patient have difficulty walking or climbing stairs?: Yes Weakness of Legs: Both Weakness of Arms/Hands: None  Permission Sought/Granted   Permission granted to share information with : Yes, Verbal Permission Granted              Emotional Assessment   Attitude/Demeanor/Rapport: Gracious Affect (typically observed): Accepting Orientation: : Oriented to Self,Oriented to Place,Oriented to  Time,Oriented to Situation  Alcohol / Substance Use: Not Applicable Psych Involvement: No (comment)  Admission diagnosis:  Discitis of lumbar region [M46.46] Patient Active Problem List   Diagnosis Date Noted  . Protein-calorie malnutrition, severe 01/29/2021  . Discitis/osteomyelitis of lumbar region  01/27/2021  . Intractable pain 01/27/2021  . Impaired ambulation 01/27/2021  . Pressure injury of skin 12/11/2020  . Chest pain 12/05/2020  . Renal insufficiency 12/05/2020  . Normocytic anemia 12/05/2020  . Thrombocytopenia (HCC) 12/05/2020  . Leukocytosis 12/05/2020  . Acute renal insufficiency   . Low back pain   . Hypothyroidism   . Hypertension   . Complication of anesthesia   . OA (osteoarthritis) of knee   . Hypercholesterolemia   . Goiter    PCP:  Cassidy Montana, MD Pharmacy:   [facility] [address] [place] - [address] [place] [address] Phone: [phone] Fax: [phone]  [id number] [place], [place] - [address] [place] [address] Phone: [phone] Fax: [phone]     Social Determinants of Health (SDOH) Interventions    Readmission Risk Interventions No flowsheet data found.

## 2021-01-31 ENCOUNTER — Inpatient Hospital Stay: Payer: Self-pay

## 2021-01-31 ENCOUNTER — Inpatient Hospital Stay (HOSPITAL_COMMUNITY): Payer: Medicare PPO

## 2021-01-31 DIAGNOSIS — M4646 Discitis, unspecified, lumbar region: Secondary | ICD-10-CM | POA: Diagnosis not present

## 2021-01-31 LAB — CBC
HCT: 28.9 % — ABNORMAL LOW (ref 36.0–46.0)
Hemoglobin: 8.9 g/dL — ABNORMAL LOW (ref 12.0–15.0)
MCH: 27.9 pg (ref 26.0–34.0)
MCHC: 30.8 g/dL (ref 30.0–36.0)
MCV: 90.6 fL (ref 80.0–100.0)
Platelets: 230 10*3/uL (ref 150–400)
RBC: 3.19 MIL/uL — ABNORMAL LOW (ref 3.87–5.11)
RDW: 16.6 % — ABNORMAL HIGH (ref 11.5–15.5)
WBC: 7.1 10*3/uL (ref 4.0–10.5)
nRBC: 0 % (ref 0.0–0.2)

## 2021-01-31 MED ORDER — POLYETHYLENE GLYCOL 3350 17 G PO PACK
17.0000 g | PACK | Freq: Every day | ORAL | Status: DC
  Administered 2021-02-01 – 2021-02-02 (×2): 17 g via ORAL
  Filled 2021-01-31 (×4): qty 1

## 2021-01-31 MED ORDER — CHLORHEXIDINE GLUCONATE CLOTH 2 % EX PADS
6.0000 | MEDICATED_PAD | Freq: Every day | CUTANEOUS | Status: DC
  Administered 2021-01-31 – 2021-02-07 (×8): 6 via TOPICAL

## 2021-01-31 MED ORDER — SODIUM CHLORIDE 0.9% FLUSH
10.0000 mL | Freq: Two times a day (BID) | INTRAVENOUS | Status: DC
  Administered 2021-01-31 – 2021-02-07 (×8): 10 mL

## 2021-01-31 MED ORDER — SODIUM CHLORIDE 0.9% FLUSH: 10.0000 mL | INTRAVENOUS | Status: DC | PRN

## 2021-01-31 MED ORDER — SENNOSIDES-DOCUSATE SODIUM 8.6-50 MG PO TABS
1.0000 | ORAL_TABLET | Freq: Every day | ORAL | Status: DC
  Administered 2021-01-31 – 2021-02-03 (×4): 1 via ORAL
  Filled 2021-01-31 (×4): qty 1

## 2021-01-31 NOTE — Progress Notes (Addendum)
PROGRESS NOTE    Cassidy DavidsonBeverly Austin  ZOX:[id number] DOB: 19-Dec-1950 DOA: 01/27/2021 PCP: Cassidy Austin, Cynthia, MD    Chief Complaint  Patient presents with  . Back Pain    Brief Narrative:  70yo with a hx of HTN., nephrolithiasis, and hypothyroidism who has been suffering with intractable low back pain since February 2022.  She required hospitalization in late February when she was found to be septic with pyelonephritis.  She was discharged to a rehab facility but but her low back pain has persisted despite the use of prescription medications.  An outpatient MRI of the lumbar spine 3/25 (obtained after outpt NS referral) revealed evidence concerning for lumbar discitis and osteomyelitis and the patient was therefore directed to the ER for admission.  3/27 CT guided aspiration of paraspinal area and disc at L1-L2  Subjective:  picc line placed today, report low back pain much improved, report continued weakness in lower extremities, but with some improvement as well  No BM for 2 weeks  Assessment & Plan:   Principal Problem:   Discitis/osteomyelitis of lumbar region Active Problems:   Hypothyroidism   Hypertension   Intractable pain   Impaired ambulation   Protein-calorie malnutrition, severe   Discitis/osteomyelitis L1-L2 -Present with her intractable low back pain, not able to ambulate, she has a Ct lumber spine on 2/7 which showed degenerative changes, no acute findings, however due to persistent low back pain, MRI of the spine was obtained on March demonstrates which is concerned for discitis and osteomyelitis L1-L2 region -IR aspiration of the infected area on March 27, culture positive for pansensitive E. Coli -Blood culture negative -PICC line placed today -Currently on Rocephin -ID directing antibiotic therapy, will follow recommendation  Nephrolithiasis -5 mm nonobstructive ureteric calculi & 3 mm right VUJcalculi that is probably obstructive noted on CT 12/11/20  -She was  evaluated by urology Dr. Marlou PorchHerrick during last hospitalization recommended outpatient follow-up, patient report has not follow-up with urology yet -She denies flank pain, I have reviewed urology notes from last hospitalization with a plan of repeat renal ultrasound to follow-up on resolution of right-sided hydronephrosis, will repeat renal ultrasound -Continue Flomax  Normocytic anemia No overt blood loss, report no BM for 2 weeks Retic count low, iron panel not consistent with iron deficiency, B12 unremarkable Anemia likely due to bone marrow suppression from ongoing infection Monitor   Hypertension Blood pressure stable on Coreg  Hypothyroidism TSH 2.088 ,continue Synthroid  Constipation Report no BM for 2 weeks Start MiraLAX and Senokot  Mild left common iliac lymphadenopathy, which is nonspecific findings on CT  From 2/7: Recommend continued follow-up by CT in 3 months.  Nutritional Assessment: The patient's BMI is: Body mass index is 40.71 kg/m.Marland Kitchen. Meet class III obesity criteria Seen by dietician.  I agree with the assessment and plan as outlined below: Nutrition Status: Nutrition Problem: Severe Malnutrition Etiology: acute illness (lumbar discitis/osteomyelitis) Signs/Symptoms: percent weight loss,energy intake < or equal to 50% for > or equal to 5 days Interventions: Ensure Enlive (each supplement provides 350kcal and 20 grams of protein),MVI  .    Skin Assessment:  I have examined the patient's skin and I agree with the wound assessment as performed by the wound care RN as outlined below:  Pressure Injury 12/10/20 Lumbar Lateral;Right Stage 2 -  Partial thickness loss of dermis presenting as a shallow open injury with a red, pink wound bed without slough. skin peeled and redness (Active)  12/10/20 2000  Location: Lumbar  Location Orientation: Lateral;Right  Staging: Stage 2 -  Partial thickness loss of dermis presenting as a shallow open injury with a red, pink  wound bed without slough.  Wound Description (Comments): skin peeled and redness  Present on Admission: No    Unresulted Labs (From admission, onward)          Start     Ordered   02/01/21 0500  CBC with Differential/Platelet  Tomorrow morning,   R        01/31/21 1748   02/01/21 0500  Basic metabolic panel  Tomorrow morning,   R        01/31/21 1748   02/01/21 0500  Magnesium  Tomorrow morning,   R        01/31/21 1748            DVT prophylaxis: SCDs Start: 01/27/21 2026   Code Status: Full Family Communication: Patient Disposition:   Status is: Inpatient  Dispo: The patient is from: Skilled nursing facility              Anticipated d/c is to: Skilled nursing facility              Anticipated d/c date is: Likely tomorrow with ID clearance                Consultants:   Neurosurgery  INR  Infectious disease  Procedures:   IR aspiration of the infected lumbar spine on March 27  PICC line placement  Antimicrobials:   Anti-infectives (From admission, onward)   Start     Dose/Rate Route Frequency Ordered Stop   01/29/21 2000  DAPTOmycin (CUBICIN) 750 mg in sodium chloride 0.9 % IVPB  Status:  Discontinued        750 mg 230 mL/hr over 30 Minutes Intravenous Daily 01/29/21 1521 01/30/21 1424   01/29/21 1700  cefTRIAXone (ROCEPHIN) 2 g in sodium chloride 0.9 % 100 mL IVPB        2 g 200 mL/hr over 30 Minutes Intravenous Every 24 hours 01/29/21 1521            Objective: Vitals:   01/30/21 2200 01/31/21 0705 01/31/21 0807 01/31/21 1437  BP: (!) 109/59 135/60 (!) 131/49 (!) 138/48  Pulse: 62 62 66 76  Resp: Temp: 97.7 F (36.5 C) 98.2 F (36.8 C) 98.9 F (37.2 C) 99.3 F (37.4 C)  TempSrc: Oral Oral Oral Oral  SpO2: 99% 97% 99% 100%  Weight:      Height:       No intake or output data in the 24 hours ending 01/31/21 1748 Filed Weights   01/27/21 2215  Weight: 114.4 kg    Examination:  General exam: calm, NAD Respiratory  system: Clear to auscultation. Respiratory effort normal. Cardiovascular system: S1 & S2 heard, RRR.  Soft murmur 1-2/6 left upper sternal border, No pedal edema. Gastrointestinal system: Abdomen is nondistended, soft and nontender.  Normal bowel sounds heard. Central nervous system: Alert and oriented. No focal neurological deficits. Extremities: Able to move toes, not able to lift legs against gravity Skin: No rashes, lesions or ulcers Psychiatry: Judgement and insight appear normal. Mood & affect appropriate.     Data Reviewed: I have personally reviewed following labs and imaging studies  CBC: Recent Labs  Lab 01/27/21 1833 01/29/21 0840 01/30/21 0236 01/31/21 0505  WBC 10.1 6.8 7.8 7.1  NEUTROABS 6.9  --   --   --   HGB 10.2* 9.4* 8.9* 8.9*  HCT  32.4* 29.1* 28.0* 28.9*  MCV 87.1 87.4 87.8 90.6  PLT 344 267 245 230    Basic Metabolic Panel: Recent Labs  Lab 01/27/21 1833 01/29/21 0840 01/30/21 0236  NA 137 139 139  K 3.4* 3.6 3.7  CL 99 106 106  CO2 30 28 25   GLUCOSE 109* 124* 119*  BUN 14 10 9   CREATININE 0.88 0.83 0.67  CALCIUM 9.0 8.9 8.5*  MG  --  2.0  --     GFR: Estimated Creatinine Clearance: 82.8 mL/min (by C-G formula based on SCr of 0.67 mg/dL).  Liver Function Tests: Recent Labs  Lab 01/27/21 1833 01/29/21 0840 01/30/21 0236  AST 18 12* 10*  ALT 10 10 10   ALKPHOS 80 69 65  BILITOT 0.8 0.5 0.4  PROT 8.1 6.8 6.2*  ALBUMIN 3.1* 2.6* 2.4*    CBG: No results for input(s): GLUCAP in the last 168 hours.   Recent Results (from the past 240 hour(s))  Culture, blood (routine x 2)     Status: None (Preliminary result)   Collection Time: 01/27/21  6:33 PM   Specimen: BLOOD  Result Value Ref Range Status   Specimen Description BLOOD LEFT ANTECUBITAL  Final   Special Requests   Final    BOTTLES DRAWN AEROBIC AND ANAEROBIC Blood Culture adequate volume   Culture   Final    NO GROWTH 4 DAYS Performed at [facility], [address] [place], [facility] [address]    Report Status PENDING  Incomplete  Resp Panel by RT-PCR (Flu A&B, Covid) Nasopharyngeal Swab     Status: None   Collection Time: 01/27/21  6:33 PM   Specimen: Nasopharyngeal Swab; Nasopharyngeal(NP) swabs in vial transport medium  Result Value Ref Range Status   SARS Coronavirus 2 by RT PCR NEGATIVE NEGATIVE Final    Comment: (NOTE) SARS-CoV-2 target nucleic acids are NOT DETECTED.  The SARS-CoV-2 RNA is generally detectable in upper respiratory specimens during the acute phase of infection. The lowest concentration of SARS-CoV-2 viral copies this assay can detect is 138 copies/mL. A negative result does not preclude SARS-Cov-2 infection and should not be used as the sole basis for treatment or other patient management decisions. A negative result may occur with  improper specimen collection/handling, submission of specimen other than nasopharyngeal swab, presence of viral mutation(s) within the areas targeted by this assay, and inadequate number of viral copies(<138 copies/mL). A negative result must be combined with clinical observations, patient history, and epidemiological information. The expected result is Negative.  Fact Sheet for Patients:  [place]  Fact Sheet for Healthcare Providers:  16109  This test is no t yet approved or cleared by the 01/29/21 FDA and  has been authorized for detection and/or diagnosis of SARS-CoV-2 by FDA under an Emergency Use Authorization (EUA). This EUA will remain  in effect (meaning this test can be used) for the duration of the COVID-19 declaration under Section 564(b)(1) of the Act, 21 U.S.C.section 360bbb-3(b)(1), unless the authorization is terminated  or revoked sooner.       Influenza A by PCR NEGATIVE NEGATIVE Final   Influenza B by PCR NEGATIVE NEGATIVE Final    Comment: (NOTE) The Xpert Xpress SARS-CoV-2/FLU/RSV plus assay  is intended as an aid in the diagnosis of influenza from Nasopharyngeal swab specimens and should not be used as a sole basis for treatment. Nasal washings and aspirates are unacceptable for Xpert Xpress SARS-CoV-2/FLU/RSV testing.  Fact Sheet for Patients: BloggerCourse.com  Fact Sheet  for Healthcare Providers: SeriousBroker.it  This test is not yet approved or cleared by the [place] and has been authorized for detection and/or diagnosis of SARS-CoV-2 by FDA under an Emergency Use Authorization (EUA). This EUA will remain in effect (meaning this test can be used) for the duration of the COVID-19 declaration under Section 564(b)(1) of the Act, 21 U.S.C. section 360bbb-3(b)(1), unless the authorization is terminated or revoked.  Performed at [facility], [address] [place], [place] [address]   Urine culture     Status: Abnormal   Collection Time: 01/27/21  6:33 PM   Specimen: Urine, Random  Result Value Ref Range Status   Specimen Description URINE, RANDOM  Final   Special Requests   Final    NONE Performed at [facility], [address], [place], [place] [address]    Culture MULTIPLE SPECIES PRESENT, SUGGEST RECOLLECTION (A)  Final   Report Status 01/29/2021 FINAL  Final  Aerobic/Anaerobic Culture (surgical/deep wound)     Status: None (Preliminary result)   Collection Time: 01/28/21 10:47 AM   Specimen: Fluid; Tissue  Result Value Ref Range Status   Specimen Description FLUID L1/L2 INTERVERTEBRAL DISC AND PARASPINAL  Final   Special Requests NONE  Final   Gram Stain   Final    MODERATE WBC PRESENT, PREDOMINANTLY PMN NO ORGANISMS SEEN Performed at [facility], [address] [place], [place] [address]    Culture   Final    RARE ESCHERICHIA COLI NO ANAEROBES ISOLATED; CULTURE IN PROGRESS FOR 5 DAYS    Report Status PENDING  Incomplete   Organism ID, Bacteria ESCHERICHIA COLI  Final       Susceptibility   Escherichia coli - MIC*    AMPICILLIN 4 SENSITIVE Sensitive     CEFAZOLIN <=4 SENSITIVE Sensitive     CEFEPIME <=0.12 SENSITIVE Sensitive     CEFTAZIDIME <=1 SENSITIVE Sensitive     CEFTRIAXONE <=0.25 SENSITIVE Sensitive     CIPROFLOXACIN <=0.25 SENSITIVE Sensitive     GENTAMICIN <=1 SENSITIVE Sensitive     IMIPENEM <=0.25 SENSITIVE Sensitive     TRIMETH/SULFA <=20 SENSITIVE Sensitive     AMPICILLIN/SULBACTAM <=2 SENSITIVE Sensitive     PIP/TAZO <=4 SENSITIVE Sensitive     * RARE ESCHERICHIA COLI         Radiology Studies: [place] EKG SITE RITE  Result Date: 01/31/2021 If Site Rite image not attached, placement could not be confirmed due to current cardiac rhythm.  [place] EKG SITE RITE  Result Date: 01/30/2021 If Site Rite image not attached, placement could not be confirmed due to current cardiac rhythm.       Scheduled Meds: . carvedilol  12.5 mg Oral BID  . Chlorhexidine Gluconate Cloth  6 each Topical Daily  . cholecalciferol  1,000 Units Oral BID  . ezetimibe  10 mg Oral Daily  . feeding supplement  237 mL Oral BID BM  . levothyroxine  100 mcg Oral Q0600  . multivitamin with minerals  1 tablet Oral Daily  . polyethylene glycol  17 g Oral Daily  . senna-docusate  1 tablet Oral QHS  . sodium chloride flush  10-40 mL Intracatheter Q12H  . tamsulosin  0.4 mg Oral QPC breakfast   Continuous Infusions: . sodium chloride 10 mL/hr at 01/30/21 1858  . cefTRIAXone (ROCEPHIN)  IV 2 g (01/31/21 1746)     LOS: 4 days   Time spent: Greater than 50% of this time was  spent in counseling, explanation of diagnosis, planning of further management, and coordination of care.   Voice Recognition Reubin Milan dictation system was used to create this note, attempts have been made to correct errors. Please contact the author with questions and/or clarifications.   Albertine Grates, MD PhD FACP [facility]  Available via Epic secure chat 7am-7pm for nonurgent  issues Please page for urgent issues To page the attending provider between 7A-7P or the covering provider during after hours 7P-7A, please log into the web site [email or web address] and access using universal [place] password for that web site. If you do not have the password, please call the hospital operator.    01/31/2021, 5:48 PM

## 2021-01-31 NOTE — Progress Notes (Signed)
Subjective: Patient reports that she is doing well. She reports that she has continued to have a decrease in her low back pain. This morning she reported a slight "twinge" in her low back. She stated it has been 2 weeks since she has walked and would like to try to ambulate. No acute events overnight.  Objective: Vital signs in last 24 hours: Temp:  [97.4 F (36.3 C)-98.9 F (37.2 C)] 98.9 F (37.2 C) (03/30 0807) Pulse Rate:  [62-66] 66 (03/30 0807) Resp:  [16-18] 16 (03/30 0807) BP: (109-135)/(49-60) 131/49 (03/30 0807) SpO2:  [97 %-100 %] 99 % (03/30 0807)  Intake/Output from previous day: 03/29 0701 - 03/30 0700 In: 540.2 [I.V.:440.2; IV Piggyback:100] Out: 400 [Urine:400] Intake/Output this shift: No intake/output data recorded.  Physical Exam: Patient is awake, A/O X 4, conversant, and in NAD. Speech is fluent and appropriate. MAEW with good strength that is symmetric bilaterally. 5/5 BUE/BLE.Reports a significant decrease in her low back pain over the last two days.   Lab Results: Recent Labs    01/29/21 0840 01/30/21 0236  WBC 6.8 7.8  HGB 9.4* 8.9*  HCT 29.1* 28.0*  PLT 267 245   BMET Recent Labs    01/29/21 0840 01/30/21 0236  NA 139 139  K 3.6 3.7  CL 106 106  CO2 28 25  GLUCOSE 124* 119*  BUN 10 9  CREATININE 0.83 0.67  CALCIUM 8.9 8.5*    Studies/Results: [place] EKG SITE RITE  Result Date: 01/31/2021 If Site Rite image not attached, placement could not be confirmed due to current cardiac rhythm.  [place] EKG SITE RITE  Result Date: 01/30/2021 If Site Rite image not attached, placement could not be confirmed due to current cardiac rhythm.   Assessment/Plan: 70 year old female withsevere and intractable low back pain that radiates into her bilateral buttocks and into her bilateral posterior thighs.MRI imaging revealed discitis/osteomyelitisat L2.CT guided aspiration of paraspinal area and disc at L1-L2completed on 3/27 and culture growing  pansensitive E. Coli. Plan to treat for 6 to 8 weeks with IV antibiotics. Plan to obtain PICC line and continue daptomycin and ceftriaxone after discharged. ID to monitor cultures for possible adjustment of antibiotics in the future.   Will order patient an LSO brace and begin mobilizing with PT.  Continue working on pain control. Call with any questions.   LOS: 4 days     Council Mechanic, DNP, NP-C 01/31/2021, 8:34 AM

## 2021-01-31 NOTE — Progress Notes (Signed)
Peripherally Inserted Central Catheter Placement  The IV Nurse has discussed with the patient and/or persons authorized to consent for the patient, the purpose of this procedure and the potential benefits and risks involved with this procedure.  The benefits include less needle sticks, lab draws from the catheter, and the patient may be discharged home with the catheter. Risks include, but not limited to, infection, bleeding, blood clot (thrombus formation), and puncture of an artery; nerve damage and irregular heartbeat and possibility to perform a PICC exchange if needed/ordered by physician.  Alternatives to this procedure were also discussed.  Bard Power PICC patient education guide, fact sheet on infection prevention and patient information card has been provided to patient /or left at bedside.   PICC Placement Documentation  PICC Single Lumen 01/31/21 PICC Right Basilic 38 cm 0 cm (Active)  Indication for Insertion or Continuance of Line Home intravenous therapies (PICC only) 01/31/21 1340  Exposed Catheter (cm) 0 cm 01/31/21 1340  Site Assessment Clean;Dry;Intact 01/31/21 1340  Line Status Flushed;Saline locked;Blood return noted 01/31/21 1340  Dressing Type Transparent;Securing device 01/31/21 1340  Dressing Status Clean;Dry;Intact 01/31/21 1340  Antimicrobial disc in place? Yes 01/31/21 1340  Safety Lock Not Applicable 01/31/21 1340  Line Care Connections checked and tightened 01/31/21 1340  Dressing Intervention New dressing 01/31/21 1340  Dressing Change Due 02/08/21 01/31/21 1340       Bing Plume 01/31/2021, 1:44 PM

## 2021-01-31 NOTE — Plan of Care (Signed)

## 2021-01-31 NOTE — Care Management Important Message (Signed)
Important Message  Patient Details  Name: Cassidy Austin MRN: [id number] Date of Birth: 1951-10-04   Medicare Important Message Given:  Yes     Drayton Tieu P Darlys Buis 01/31/2021, 3:00 PM

## 2021-01-31 NOTE — Progress Notes (Signed)
Orthopedic Tech Progress Note Patient Details:  Cassidy Austin [birth year] [id number] Called in order to [facility] for a LSO Patient ID: Cassidy Austin, female   DOB: [birth year], 70 y.o.   MRN: [id number]   Cassidy Austin 01/31/2021, 9:10 AM

## 2021-01-31 NOTE — Progress Notes (Signed)
ID Brief Note    PICC line ordered. Blood cx are NG in 4 days Following cultures   Odette Fraction, MD Infectious Disease Physician [facility] [address] [place], [place] [address] Phone: [phone]  Fax: [phone]

## 2021-01-31 NOTE — Evaluation (Addendum)
Physical Therapy Evaluation Patient Details Name: Cassidy Austin MRN: [id number] DOB: [birth year] Today's Date: 01/31/2021   History of Present Illness  70 y.o. female admitted from [facility] on 01/27/21 for back pain.  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  Pt s/p CT guided aspiration of R psoas fluid collection and disc space L1-2.  Pt with significant PMH of recent hospitalization 1/31-12/13/20 for pylonephritis and NSTEMI d/c to SNF for rehab, HTN.  Clinical Impression  Pt was able to sit EOB with me again this PM (sat EOB and attempted standing with OT in AM).  She continues to be limited by pain and weakness with mobility in her back and posterior upper thighs.  She has painful spasms.  She was able to attempt standing x4 from elevated HOB with me, but unable to get fully upright on her feet.  The steady was used, but may be too small for her body shape/size.  She might do better with reverse recliner chair or bil blocked knees and RW.  I do not know how fast she will progress, so SNF level rehab remains appropriate.  She was at [facility].     Follow Up Recommendations CIR    Equipment Recommendations  Wheelchair (measurements PT);Wheelchair cushion (measurements PT);Hospital bed;Other (comment) (hoyer lift)    Recommendations for Other Services       Precautions / Restrictions Precautions Precautions: Fall;Back Precaution Booklet Issued: No Precaution Comments: verbally reviewed back precautions Required Braces or Orthoses: Spinal Brace Spinal Brace: Lumbar corset;Applied in sitting position      Mobility  Bed Mobility Overal bed mobility: Needs Assistance Bed Mobility: Rolling;Sidelying to Sit;Sit to Sidelying Rolling: Mod assist Sidelying to sit: Mod assist     Sit to sidelying: Mod assist General bed mobility comments: Heavy mod assist to log roll, come up to sitting from her side and return to sidelying from sitting.  Most  assist needed at trunk to come up and at feel to return.  Pt heavily relying on bed rail for leverage.    Transfers Overall transfer level: Needs assistance   Transfers: Sit to/from Stand Sit to Stand: Mod assist;From elevated surface         General transfer comment: Heavy mod assist to attempt to stand x 4 from elevated bed.  Pt has a difficult time fitting between the side bars of the steady.  Might be better to try without (two person assist, RW, block both knees).  Ambulation/Gait             General Gait Details: unable at this time.  Stairs            Wheelchair Mobility    Modified Rankin (Stroke Patients Only)       Balance Overall balance assessment: Needs assistance Sitting-balance support: Feet supported;Bilateral upper extremity supported Sitting balance-Leahy Scale: Poor Sitting balance - Comments: needs bil UE support in sitting, posterior lean   Standing balance support: Bilateral upper extremity supported   Standing balance comment: pt unable to achieve fully upright stand, most of her partial stands were flexed over the front of the steady standing frame.                             Pertinent Vitals/Pain Pain Assessment: Faces Faces Pain Scale: Hurts whole lot Pain Location: low back and BLE during spasms and/or bed mobility and transfer practice Pain Descriptors / Indicators: Grimacing;Spasm Pain Intervention(s): Limited activity within  patient's tolerance;Monitored during session;Repositioned;RN gave pain meds during session    Home Living Family/patient expects to be discharged to:: Private residence Living Arrangements: Other (Comment) (plans to go home with nephew for ahwile) Available Help at Discharge: Family;Available PRN/intermittently (nephew works fulltime) Type of Home: [employer] Access: Stairs to enter Entrance Stairs-Rails: None Secretary/administrator of Steps: 2 Home Layout: Two level;Bed/bath upstairs Home  Equipment: Wheelchair - manual Additional Comments: unsure if she has if access to walker    Prior Function Level of Independence: Independent         Comments: prior to February admission she was independent. At [facility] if she could achieve adequate pain relief she was able to stand with +1 assist and needed assist with ADLs.     Hand Dominance   Dominant Hand: Right    Extremity/Trunk Assessment   Upper Extremity Assessment Upper Extremity Assessment: Defer to OT evaluation    Lower Extremity Assessment Lower Extremity Assessment: Generalized weakness (3-/5 grossly equal bil LEs per bed level assessment, sensation intact bil)    Cervical / Trunk Assessment Cervical / Trunk Assessment: Other exceptions Cervical / Trunk Exceptions: large body habitus, so increased lordosis in sitting  Communication   Communication: No difficulties  Cognition Arousal/Alertness: Awake/alert Behavior During Therapy: WFL for tasks assessed/performed Overall Cognitive Status: Within Functional Limits for tasks assessed                                        General Comments General comments (skin integrity, edema, etc.): would benefit from pre medication if able.    Exercises     Assessment/Plan    PT Assessment Patient needs continued PT services  PT Problem List Decreased strength;Decreased activity tolerance;Decreased mobility;Decreased balance;Decreased knowledge of use of DME;Decreased knowledge of precautions;Obesity;Pain       PT Treatment Interventions DME instruction;Gait training;Stair training;Functional mobility training;Therapeutic activities;Therapeutic exercise;Balance training;Neuromuscular re-education;Patient/family education;Manual techniques;Modalities    PT Goals (Current goals can be found in the Care Plan section)  Acute Rehab PT Goals Patient Stated Goal: to go home if she can, but realizes she may need rehab, to walk again, return to her  PLOF PT Goal Formulation: With patient Time For Goal Achievement: 02/14/21 Potential to Achieve Goals: Good    Frequency 3xs/wk   Barriers to discharge        Co-evaluation               AM-PAC PT "6 Clicks" Mobility  Outcome Measure Help needed turning from your back to your side while in a flat bed without using bedrails?: A Lot Help needed moving from lying on your back to sitting on the side of a flat bed without using bedrails?: A Lot Help needed moving to and from a bed to a chair (including a wheelchair)?: Total Help needed standing up from a chair using your arms (e.g., wheelchair or bedside chair)?: Total Help needed to walk in hospital room?: Total Help needed climbing 3-5 steps with a railing? : Total 6 Click Score: 8    End of Session Equipment Utilized During Treatment: Back brace Activity Tolerance: Patient limited by pain Patient left: in bed;with call bell/phone within reach Nurse Communication: Mobility status PT Visit Diagnosis: Muscle weakness (generalized) (M62.81);Difficulty in walking, not elsewhere classified (R26.2);Other symptoms and signs involving the nervous system (R29.898)    Time: 6045-4098 PT Time Calculation (min) (ACUTE ONLY): 74 min  Charges:   PT Evaluation $PT Eval Moderate Complexity: 1 Mod PT Treatments $Therapeutic Activity: 53-67 mins       Corinna Capra, PT, DPT  Acute Rehabilitation [phone] pager [phone]) [phone] office

## 2021-01-31 NOTE — Evaluation (Signed)
Occupational Therapy Evaluation Patient Details Name: Cassidy Austin MRN: [id number] DOB: July 14, 1951 Today's Date: 01/31/2021    History of Present Illness 70 year old female with medical history significant for HTN and hypothyroidism as well intractable low back pain that started around February for which she was hospitalized for and found to have sepsis secondary to pyelonephritis. Pt presents this admission with progressive pain despite pain medications. MRI imaging revealed discitis/osteomyelitis at L2. Pt is now s/p CT guided aspiration of paraspinal area and disc at L1-L2.   Clinical Impression   Pt admitted with the above diagnoses and presents with below problem list. Pt will benefit from continued acute OT to address the below listed deficits and maximize independence with basic ADLs prior to d/c to venue below. At baseline (prior to February) pt is independent with ADLs. Since her previous hospital admission she has needed assist with ADLs and only occasionally was able to achieve enough pain relief to stand but did so with only +1 assist per her report. Pt currently needs max +2 for LB ADLs at bed level, max A for UB ADLs in supported seating position vs bed level. Able to complete 2x partial stands from EOB this session with max A +2. Pt is very motivated to work with therapies. Given this and her baseline independence, feel she would be a good candidate for intensive rehab program at time of d/c.      Follow Up Recommendations  CIR    Equipment Recommendations  3 in 1 bedside commode    Recommendations for Other Services Rehab consult;PT consult     Precautions / Restrictions Precautions Precautions: Fall;Other (comment) (no order set for back precautions but tried to follow at least for comfort.) Restrictions Weight Bearing Restrictions: No      Mobility Bed Mobility Overal bed mobility: Needs Assistance Bed Mobility: Rolling;Sidelying to Sit;Sit to Sidelying Rolling:  Mod assist Sidelying to sit: Max assist     Sit to sidelying: +2 for physical assistance;+2 for safety/equipment;Mod assist General bed mobility comments: Assist to power over to sidelying position and to power up trunk to sit EOB. Pt utilized bedrail. Also needed assist to control descent into sidelying at end of session and to advance BLE up onto bed. Was able to improve with rolling from mod A to min A during session.    Transfers Overall transfer level: Needs assistance Equipment used: Rolling walker (2 wheeled) Transfers: Sit to/from Stand (partially stand) Sit to Stand: Max assist;+2 physical assistance;From elevated surface         General transfer comment: Pt able to clear hips briefly with max A +2 and cues for positioning and technique with rw. Seated rest break for a few minutes then able to complete a second time with similar results. After another seated rest break attempted to use Stedy but pt too fatigued to clear hips on this 3rd trial.    Balance Overall balance assessment: Needs assistance Sitting-balance support: Bilateral upper extremity supported;Feet supported Sitting balance-Leahy Scale: Poor Sitting balance - Comments: needs BUE support to static sit EOB though she was able to brielfy sit with single extremity support to reposition each breast to don back brace.       Standing balance comment: 2x partial stand. briefly cleared hips each time with +2 max A, unable to progress further this session.                           ADL either performed or  assessed with clinical judgement   ADL Overall ADL's : Needs assistance/impaired Eating/Feeding: Set up;Bed level Eating/Feeding Details (indicate cue type and reason): vs supported sitting Grooming: Set up;Bed level Grooming Details (indicate cue type and reason): vs supported sitting Upper Body Bathing: Maximal assistance;Sitting Upper Body Bathing Details (indicate cue type and reason): Pt needing  BUE support to static sit EOB. Either Max A in seated position or mod A at bed level to wash back Lower Body Bathing: Maximal assistance;+2 for physical assistance;Bed level Lower Body Bathing Details (indicate cue type and reason): struggled with lateral leans to either side. for pericare would need to complete bed level at this time. Upper Body Dressing : Moderate assistance;Maximal assistance;Sitting Upper Body Dressing Details (indicate cue type and reason): Able to briefly sit with single extremity support to reposition each breast during donning of back brace. Lower Body Dressing: Maximal assistance;+2 for physical assistance;Bed level                 General ADL Comments: Pt completed bed mobility, sat EOB with BUE support. Then completed 2/3 partial stands from elevated bed height. Cyd Silence on third trial though pt too fatigued to clear hips at that point.     Vision         Perception     Praxis      Pertinent Vitals/Pain Pain Assessment: Faces Faces Pain Scale: Hurts whole lot Pain Location: low back and BLE during spasms and/or bed mobility and transfer practice Pain Descriptors / Indicators: Grimacing;Spasm Pain Intervention(s): Limited activity within patient's tolerance;Monitored during session;Premedicated before session;Repositioned     Hand Dominance     Extremity/Trunk Assessment Upper Extremity Assessment Upper Extremity Assessment: Generalized weakness   Lower Extremity Assessment Lower Extremity Assessment: Defer to PT evaluation       Communication Communication Communication: No difficulties   Cognition Arousal/Alertness: Awake/alert Behavior During Therapy: WFL for tasks assessed/performed Overall Cognitive Status: Within Functional Limits for tasks assessed                                     General Comments       Exercises     Shoulder Instructions      Home Living Family/patient expects to be discharged to::  Private residence Living Arrangements: Other (Comment) (plans to go home with nephew for ahwile) Available Help at Discharge: Family;Available PRN/intermittently (nephew works fulltime) Type of Home: [employer] Access: Stairs to enter Secretary/administrator of Steps: 2 Entrance Stairs-Rails: None Home Layout: Two level;Bed/bath upstairs Alternate Level Stairs-Number of Steps: full flight Alternate Level Stairs-Rails: Right;Left (initially can reach both then then has to choose) Bathroom Shower/Tub: Producer, television/film/video: Standard     Home Equipment: Wheelchair - manual   Additional Comments: unsure if she has if access to walker      Prior Functioning/Environment Level of Independence: Independent        Comments: prior to February admission she was independent. At [facility] if she could achieve adequate pain relief she was able to stand with +1 assist and needed assist with ADLs.        OT Problem List: Decreased strength;Decreased activity tolerance;Impaired balance (sitting and/or standing);Decreased knowledge of use of DME or AE;Decreased knowledge of precautions;Obesity;Pain      OT Treatment/Interventions: Self-care/ADL training;Therapeutic exercise;Energy conservation;DME and/or AE instruction;Therapeutic activities;Patient/family education;Balance training    OT Goals(Current goals can be found in  the care plan section) Acute Rehab OT Goals Patient Stated Goal: regain independence, be able to stand and walk OT Goal Formulation: With patient Time For Goal Achievement: 02/14/21 Potential to Achieve Goals: Good ADL Goals Pt Will Perform Upper Body Bathing: with mod assist;sitting Pt Will Perform Lower Body Bathing: sit to/from stand;sitting/lateral leans;with max assist Pt Will Transfer to Toilet: with max assist;with +2 assist;stand pivot transfer Additional ADL Goal #1: Pt will complete sidelying<>EOB at min A level to prepare for EOB/OOB  ADLs. Additional ADL Goal #2: Pt will sit EOB for 2 minutes at min guard level with up to single UE support to faciliate UB ADLs.  OT Frequency: Min 2X/week   Barriers to D/C:            Co-evaluation              AM-PAC OT "6 Clicks" Daily Activity     Outcome Measure Help from another person eating meals?: None Help from another person taking care of personal grooming?: A Little Help from another person toileting, which includes using toliet, bedpan, or urinal?: Total Help from another person bathing (including washing, rinsing, drying)?: Total Help from another person to put on and taking off regular upper body clothing?: A Lot Help from another person to put on and taking off regular lower body clothing?: Total 6 Click Score: 12   End of Session Equipment Utilized During Treatment: Rolling walker;Other (comment) (trialed Stedy) Nurse Communication: Other (comment) (RN present and involved during standing trials and back to bed.)  Activity Tolerance: Patient limited by pain;Other (comment) (limited by significant BLE weakness) Patient left: in bed;with call bell/phone within reach;with bed alarm set;with SCD's reapplied  OT Visit Diagnosis: Unsteadiness on feet (R26.81);Other abnormalities of gait and mobility (R26.89);Muscle weakness (generalized) (M62.81);Pain                Time: 1018-1110 OT Time Calculation (min): 52 min Charges:  OT General Charges $OT Visit: 1 Visit OT Evaluation $OT Eval Moderate Complexity: 1 Mod OT Treatments $Self Care/Home Management : 23-37 mins  Raynald Kemp, OT Acute Rehabilitation Services Pager: [phone] Office: [phone]   Pilar Grammes 01/31/2021, 12:40 PM

## 2021-02-01 DIAGNOSIS — M4646 Discitis, unspecified, lumbar region: Secondary | ICD-10-CM | POA: Diagnosis not present

## 2021-02-01 DIAGNOSIS — R600 Localized edema: Secondary | ICD-10-CM | POA: Diagnosis not present

## 2021-02-01 LAB — BASIC METABOLIC PANEL
Anion gap: 5 (ref 5–15)
BUN: 11 mg/dL (ref 8–23)
CO2: 27 mmol/L (ref 22–32)
Calcium: 8.5 mg/dL — ABNORMAL LOW (ref 8.9–10.3)
Chloride: 107 mmol/L (ref 98–111)
Creatinine, Ser: 0.74 mg/dL (ref 0.44–1.00)
GFR, Estimated: >60 mL/min (ref >60)
Glucose, Bld: 107 mg/dL — ABNORMAL HIGH (ref 70–99)
Potassium: 3.7 mmol/L (ref 3.5–5.1)
Sodium: 139 mmol/L (ref 135–145)

## 2021-02-01 LAB — CBC WITH DIFFERENTIAL/PLATELET
Abs Immature Granulocytes: 0.03 10*3/uL (ref 0.00–0.07)
Basophils Absolute: 0.1 10*3/uL (ref 0.0–0.1)
Basophils Relative: 1 %
Eosinophils Absolute: 0.7 10*3/uL — ABNORMAL HIGH (ref 0.0–0.5)
Eosinophils Relative: 9 %
HCT: 26 % — ABNORMAL LOW (ref 36.0–46.0)
Hemoglobin: 8.2 g/dL — ABNORMAL LOW (ref 12.0–15.0)
Immature Granulocytes: 0 %
Lymphocytes Relative: 17 %
Lymphs Abs: 1.3 10*3/uL (ref 0.7–4.0)
MCH: 27.9 pg (ref 26.0–34.0)
MCHC: 31.5 g/dL (ref 30.0–36.0)
MCV: 88.4 fL (ref 80.0–100.0)
Monocytes Absolute: 0.7 10*3/uL (ref 0.1–1.0)
Monocytes Relative: 10 %
Neutro Abs: 4.7 10*3/uL (ref 1.7–7.7)
Neutrophils Relative %: 63 %
Platelets: 244 10*3/uL (ref 150–400)
RBC: 2.94 MIL/uL — ABNORMAL LOW (ref 3.87–5.11)
RDW: 16.2 % — ABNORMAL HIGH (ref 11.5–15.5)
WBC: 7.4 10*3/uL (ref 4.0–10.5)
nRBC: 0 % (ref 0.0–0.2)

## 2021-02-01 LAB — CULTURE, BLOOD (ROUTINE X 2)
Culture: NO GROWTH
Special Requests: ADEQUATE

## 2021-02-01 LAB — MAGNESIUM: Magnesium: 1.9 mg/dL (ref 1.7–2.4)

## 2021-02-01 MED ORDER — CEFAZOLIN SODIUM-DEXTROSE 2-4 GM/100ML-% IV SOLN
2.0000 g | Freq: Three times a day (TID) | INTRAVENOUS | Status: DC
  Administered 2021-02-01 – 2021-02-07 (×19): 2 g via INTRAVENOUS
  Filled 2021-02-01 (×23): qty 100

## 2021-02-01 MED ORDER — ENOXAPARIN SODIUM 40 MG/0.4ML ~~LOC~~ SOLN
40.0000 mg | SUBCUTANEOUS | Status: DC
  Administered 2021-02-01 – 2021-02-07 (×7): 40 mg via SUBCUTANEOUS
  Filled 2021-02-01 (×7): qty 0.4

## 2021-02-01 NOTE — Progress Notes (Signed)
PHARMACY CONSULT NOTE FOR:  OUTPATIENT  PARENTERAL ANTIBIOTIC THERAPY (OPAT)  Indication: osteomyelitis Regimen: cefazolin 2g IV q8h End date: 03/14/2021  IV antibiotic discharge orders are pended. To discharging provider:  please sign these orders via discharge navigator,  Select New Orders & click on the button choice - Manage This Unsigned Work.     Thank you for allowing pharmacy to be a part of this patient's care.  Jeannetta Nap 02/01/2021, 9:11 AM

## 2021-02-01 NOTE — Progress Notes (Signed)
Subjective: Patient reports that she is continuing to feel better and better each day. She has no complaints and is appreciative of the care she has received. No acute events overnight.   Objective: Vital signs in last 24 hours: Temp:  [98.3 F (36.8 C)-99.3 F (37.4 C)] 98.3 F (36.8 C) (03/31 0656) Pulse Rate:  [65-76] 65 (03/31 0656) Resp:  [16-18] 18 (03/31 0656) BP: (125-138)/(48-52) 127/51 (03/31 0656) SpO2:  [98 %-100 %] 98 % (03/31 0656)  Intake/Output from previous day: 03/30 0701 - 03/31 0700 In: -  Out: 800 [Urine:800] Intake/Output this shift: No intake/output data recorded.  Physical Exam: Patient is awake, A/O X 4, conversant, and inNAD. Speech is fluent and appropriate. MAEW with good strength that is symmetric bilaterally. 5/5 BUE/BLE.Reports a significant decrease in her low back pain over the last two days.  Lab Results: Recent Labs    01/31/21 0505 02/01/21 0242  WBC 7.1 7.4  HGB 8.9* 8.2*  HCT 28.9* 26.0*  PLT 230 244   BMET Recent Labs    01/30/21 0236 02/01/21 0242  NA 139 139  K 3.7 3.7  CL 106 107  CO2 25 27  GLUCOSE 119* 107*  BUN 9 11  CREATININE 0.67 0.74  CALCIUM 8.5* 8.5*    Studies/Results: US RENAL  Result Date: 01/31/2021 CLINICAL DATA:  Hydronephrosis. EXAM: RENAL / URINARY TRACT ULTRASOUND COMPLETE COMPARISON:  Included portion from disc aspiration 01/28/2021. Abdominal CT 12/11/2020, lumbar MRI 01/26/2021 FINDINGS: Right Kidney: Renal measurements: 11 x 4.2 x 5.1 cm = volume: 124 mL. Hydronephrosis on prior CT has resolved. There is mild increased renal parenchymal echogenicity. Hypoechoic 2.7 x 2.4 x 2.3 cm area in the superior kidney likely represent cyst when compared with prior MRI, but is not well assessed on the current exam due to habitus. Left Kidney: Renal measurements: 10.3 x 5.3 x 4.7 cm = volume: 133 mL. No hydronephrosis. There is a 2.2 cm parapelvic cyst in the lower kidney. No obvious solid lesion. 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 There is mild bladder wall thickening. Other: None. IMPRESSION: 1. No hydronephrosis. 2. Known hypoechoic area in the superior right kidney likely represents a cyst, but is not well-defined on the current exam. There is a parapelvic cyst in the lower left kidney. 3. 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 Electronically Signed   By: Narda Rutherford M.D.   On: 01/31/2021 21:37   [place] EKG SITE RITE  Result Date: 01/31/2021 If Site Rite image not attached, placement could not be confirmed due to current cardiac rhythm.  [place] EKG SITE RITE  Result Date: 01/30/2021 If Site Rite image not attached, placement could not be confirmed due to current cardiac rhythm.   Assessment/Plan: 70 year old female withdiscitis/osteomyelitisat L2.Initially, she had c/o severe and intractable low back pain that radiated into her bilateral buttocks and into her bilateral posterior thighs, now much improved.CT guided aspiration of paraspinal area and disc at L1-L2completedon 3/27 and culture growing pansensitive E. Coli. Plan to treat for 6 to 8 weeks with IV antibiotics. RUE PICC line in place. Plan to continue daptomycin and ceftriaxone after discharged per ID. ID to monitor cultures for possible adjustment of antibiotics in the future. Continue LSO brace when OOB. Continue working with therapies and working on pain control.Call with any questions.   LOS: 5 days     Council Mechanic, DNP, NP-C 02/01/2021, 7:16 AM

## 2021-02-01 NOTE — Progress Notes (Signed)
PROGRESS NOTE    Cassidy Austin  UKG:[id number] DOB: 1951/09/16 DOA: 01/27/2021 PCP: Laurann Montana, MD    Chief Complaint  Patient presents with  . Back Pain    Brief Narrative:  70yo with a hx of HTN., nephrolithiasis, and hypothyroidism who has been suffering with intractable low back pain since February 2022.  She required hospitalization in late February when she was found to be septic with pyelonephritis.  She was discharged to a rehab facility but but her low back pain has persisted despite the use of prescription medications.  An outpatient MRI of the lumbar spine 3/25 (obtained after outpt NS referral) revealed evidence concerning for lumbar discitis and osteomyelitis and the patient was therefore directed to the ER for admission.  3/27 CT guided aspiration of paraspinal area and disc at L1-L2  Subjective:  reports low back pain much improved, does has intermittent muscle spasm on lower back and pain radiating down to both legs report continued weakness in lower extremities, but appears to continue showing improvement   No BM for 2 weeks  Assessment & Plan:   Principal Problem:   Discitis/osteomyelitis of lumbar region Active Problems:   Hypothyroidism   Hypertension   Intractable pain   Impaired ambulation   Protein-calorie malnutrition, severe   Discitis/osteomyelitis L1-L2 -Present with her intractable low back pain, not able to ambulate, she has a Ct lumber spine on 2/7 which showed degenerative changes, no acute findings, however due to persistent low back pain, MRI of the spine was obtained on March demonstrates which is concerned for discitis and osteomyelitis L1-L2 region -IR aspiration of the infected area on March 27, culture positive for pansensitive E. Coli -Blood culture negative -PICC line placed today -was on rocephin, Currently on ancef, plan treat for 6-8 weeks from 3/27, appreciate ID recommendations, f/u with ID on 4/14 at  11:30am   Nephrolithiasis -5 mm nonobstructive ureteric calculi & 3 mm right VUJcalculi that is probably obstructive noted on CT 12/11/20  -She was evaluated by urology Dr. Marlou Porch during last hospitalization recommended outpatient follow-up, patient report has not follow-up with urology yet -She denies flank pain, I have reviewed urology notes from last hospitalization with a plan of repeat renal ultrasound to follow-up on resolution of right-sided hydronephrosis, repeat renal ultrasound did not show hydronephrosis -Continue Flomax F/u with urology outpatient  Normocytic anemia No overt blood loss, report no BM for 2 weeks Retic count low, iron panel not consistent with iron deficiency, B12 unremarkable Anemia likely due to bone marrow suppression from ongoing infection Monitor   Hypertension Blood pressure stable on Coreg  Hypothyroidism TSH 2.088 ,continue Synthroid  Constipation Report no BM for 2 weeks Start MiraLAX and Senokot  Mild left common iliac lymphadenopathy, which is nonspecific findings on CT  From 2/7: Recommend continued follow-up by CT in 3 months.  Nutritional Assessment: The patient's BMI is: Body mass index is 40.71 kg/m.Marland Kitchen Meet class III obesity criteria Seen by dietician.  I agree with the assessment and plan as outlined below: Nutrition Status: Nutrition Problem: Severe Malnutrition Etiology: acute illness (lumbar discitis/osteomyelitis) Signs/Symptoms: percent weight loss,energy intake < or equal to 50% for > or equal to 5 days Interventions: Ensure Enlive (each supplement provides 350kcal and 20 grams of protein),MVI  .    Skin Assessment:  I have examined the patient's skin and I agree with the wound assessment as performed by the wound care RN as outlined below:  Pressure Injury 12/10/20 Lumbar Lateral;Right Stage 2 -  Partial  thickness loss of dermis presenting as a shallow open injury with a red, pink wound bed without slough. skin peeled  and redness (Active)  12/10/20 2000  Location: Lumbar  Location Orientation: Lateral;Right  Staging: Stage 2 -  Partial thickness loss of dermis presenting as a shallow open injury with a red, pink wound bed without slough.  Wound Description (Comments): skin peeled and redness  Present on Admission: No    Unresulted Labs (From admission, onward)         None        DVT prophylaxis: enoxaparin (LOVENOX) injection 40 mg Start: 02/01/21 1815 SCDs Start: 01/27/21 2026   Code Status: Full Family Communication: Patient Disposition:   Status is: Inpatient  Dispo: The patient is from: Skilled nursing facility              Anticipated d/c is to: CIR              Anticipated d/c date is: pending CIR eval                Consultants:   Neurosurgery  INR  Infectious disease  Procedures:   IR aspiration of the infected lumbar spine on March 27  PICC line placement on 3/30  Antimicrobials:   Anti-infectives (From admission, onward)   Start     Dose/Rate Route Frequency Ordered Stop   02/01/21 1400  ceFAZolin (ANCEF) IVPB 2g/100 mL premix        2 g 200 mL/hr over 30 Minutes Intravenous Every 8 hours 02/01/21 0910     01/29/21 2000  DAPTOmycin (CUBICIN) 750 mg in sodium chloride 0.9 % IVPB  Status:  Discontinued        750 mg 230 mL/hr over 30 Minutes Intravenous Daily 01/29/21 1521 01/30/21 1424   01/29/21 1700  cefTRIAXone (ROCEPHIN) 2 g in sodium chloride 0.9 % 100 mL IVPB  Status:  Discontinued        2 g 200 mL/hr over 30 Minutes Intravenous Every 24 hours 01/29/21 1521 02/01/21 0910          Objective: Vitals:   01/31/21 1958 02/01/21 0656 02/01/21 1042 02/01/21 1712  BP: (!) 125/52 (!) 127/51 130/61 129/64  Pulse: 75 65 69 67  Resp: 18 18 18 20   Temp: 98.5 F (36.9 C) 98.3 F (36.8 C) 98.2 F (36.8 C) 98.6 F (37 C)  TempSrc: Oral Oral Oral Oral  SpO2: 100% 98% 99% 98%  Weight:      Height:        Intake/Output Summary (Last 24 hours) at  02/01/2021 1715 Last data filed at 02/01/2021 0657 Gross per 24 hour  Intake --  Output 800 ml  Net -800 ml   Filed Weights   01/27/21 2215  Weight: 114.4 kg    Examination:  General exam: calm, NAD Respiratory system: Clear to auscultation. Respiratory effort normal. Cardiovascular system: S1 & S2 heard, RRR.  Soft murmur 1-2/6 left upper sternal border, No pedal edema. Gastrointestinal system: Abdomen is nondistended, soft and nontender.  Normal bowel sounds heard. Central nervous system: Alert and oriented. No focal neurological deficits. Extremities: Able to move toes, not able to lift legs against gravity Skin: No rashes, lesions or ulcers Psychiatry: Judgement and insight appear normal. Mood & affect appropriate.     Data Reviewed: I have personally reviewed following labs and imaging studies  CBC: Recent Labs  Lab 01/27/21 1833 01/29/21 0840 01/30/21 0236 01/31/21 0505 02/01/21 0242  WBC 10.1 6.8 7.8  7.1 7.4  NEUTROABS 6.9  --   --   --  4.7  HGB 10.2* 9.4* 8.9* 8.9* 8.2*  HCT 32.4* 29.1* 28.0* 28.9* 26.0*  MCV 87.1 87.4 87.8 90.6 88.4  PLT 344 267 245 230 244    Basic Metabolic Panel: Recent Labs  Lab 01/27/21 1833 01/29/21 0840 01/30/21 0236 02/01/21 0242  NA 137 139 139 139  K 3.4* 3.6 3.7 3.7  CL 99 106 106 107  CO2 GLUCOSE 109* 124* 119* 107*  BUN CREATININE 0.88 0.83 0.67 0.74  CALCIUM 9.0 8.9 8.5* 8.5*  MG  --  2.0  --  1.9    GFR: Estimated Creatinine Clearance: 82.8 mL/min (by C-G formula based on SCr of 0.74 mg/dL).  Liver Function Tests: Recent Labs  Lab 01/27/21 1833 01/29/21 0840 01/30/21 0236  AST 18 12* 10*  ALT ALKPHOS 80 69 65  BILITOT 0.8 0.5 0.4  PROT 8.1 6.8 6.2*  ALBUMIN 3.1* 2.6* 2.4*    CBG: No results for input(s): GLUCAP in the last 168 hours.   Recent Results (from the past 240 hour(s))  Culture, blood (routine x 2)     Status: None   Collection Time: 01/27/21  6:33 PM    Specimen: BLOOD  Result Value Ref Range Status   Specimen Description BLOOD LEFT ANTECUBITAL  Final   Special Requests   Final    BOTTLES DRAWN AEROBIC AND ANAEROBIC Blood Culture adequate volume   Culture   Final    NO GROWTH 5 DAYS Performed at [facility], [address], [place], [place] [address]    Report Status 02/01/2021 FINAL  Final  Resp Panel by RT-PCR (Flu A&B, Covid) Nasopharyngeal Swab     Status: None   Collection Time: 01/27/21  6:33 PM   Specimen: Nasopharyngeal Swab; Nasopharyngeal(NP) swabs in vial transport medium  Result Value Ref Range Status   SARS Coronavirus 2 by RT PCR NEGATIVE NEGATIVE Final    Comment: (NOTE) SARS-CoV-2 target nucleic acids are NOT DETECTED.  The SARS-CoV-2 RNA is generally detectable in upper respiratory specimens during the acute phase of infection. The lowest concentration of SARS-CoV-2 viral copies this assay can detect is 138 copies/mL. A negative result does not preclude SARS-Cov-2 infection and should not be used as the sole basis for treatment or other patient management decisions. A negative result may occur with  improper specimen collection/handling, submission of specimen other than nasopharyngeal swab, presence of viral mutation(s) within the areas targeted by this assay, and inadequate number of viral copies(<138 copies/mL). A negative result must be combined with clinical observations, patient history, and epidemiological information. The expected result is Negative.  Fact Sheet for Patients:  BloggerCourse.com  Fact Sheet for Healthcare Providers:  SeriousBroker.it  This test is no t yet approved or cleared by the [place] FDA and  has been authorized for detection and/or diagnosis of SARS-CoV-2 by FDA under an Emergency Use Authorization (EUA). This EUA will remain  in effect (meaning this test can be used) for the duration of the COVID-19 declaration  under Section 564(b)(1) of the Act, 21 U.S.C.section 360bbb-3(b)(1), unless the authorization is terminated  or revoked sooner.       Influenza A by PCR NEGATIVE NEGATIVE Final   Influenza B by PCR NEGATIVE NEGATIVE Final    Comment: (NOTE) The Xpert Xpress SARS-CoV-2/FLU/RSV plus assay is intended as an aid in the diagnosis  of influenza from Nasopharyngeal swab specimens and should not be used as a sole basis for treatment. Nasal washings and aspirates are unacceptable for Xpert Xpress SARS-CoV-2/FLU/RSV testing.  Fact Sheet for Patients: BloggerCourse.comhttps://www.fda.gov/media/152166/download  Fact Sheet for Healthcare Providers: SeriousBroker.ithttps://www.fda.gov/media/152162/download  This test is not yet approved or cleared by the [place] FDA and has been authorized for detection and/or diagnosis of SARS-CoV-2 by FDA under an Emergency Use Authorization (EUA). This EUA will remain in effect (meaning this test can be used) for the duration of the COVID-19 declaration under Section 564(b)(1) of the Act, 21 U.S.C. section 360bbb-3(b)(1), unless the authorization is terminated or revoked.  Performed at [facility], [address] [place], [place] [address]   Urine culture     Status: Abnormal   Collection Time: 01/27/21  6:33 PM   Specimen: Urine, Random  Result Value Ref Range Status   Specimen Description URINE, RANDOM  Final   Special Requests   Final    NONE Performed at [facility], [address] [place], [place] [address]    Culture MULTIPLE SPECIES PRESENT, SUGGEST RECOLLECTION (A)  Final   Report Status 01/29/2021 FINAL  Final  Aerobic/Anaerobic Culture (surgical/deep wound)     Status: None (Preliminary result)   Collection Time: 01/28/21 10:47 AM   Specimen: Fluid; Tissue  Result Value Ref Range Status   Specimen Description FLUID L1/L2 INTERVERTEBRAL DISC AND PARASPINAL  Final   Special Requests NONE  Final   Gram Stain   Final    MODERATE WBC PRESENT,  PREDOMINANTLY PMN NO ORGANISMS SEEN Performed at [facility], [address] [place], [place] [address]    Culture   Final    RARE ESCHERICHIA COLI NO ANAEROBES ISOLATED; CULTURE IN PROGRESS FOR 5 DAYS    Report Status PENDING  Incomplete   Organism ID, Bacteria ESCHERICHIA COLI  Final      Susceptibility   Escherichia coli - MIC*    AMPICILLIN 4 SENSITIVE Sensitive     CEFAZOLIN <=4 SENSITIVE Sensitive     CEFEPIME <=0.12 SENSITIVE Sensitive     CEFTAZIDIME <=1 SENSITIVE Sensitive     CEFTRIAXONE <=0.25 SENSITIVE Sensitive     CIPROFLOXACIN <=0.25 SENSITIVE Sensitive     GENTAMICIN <=1 SENSITIVE Sensitive     IMIPENEM <=0.25 SENSITIVE Sensitive     TRIMETH/SULFA <=20 SENSITIVE Sensitive     AMPICILLIN/SULBACTAM <=2 SENSITIVE Sensitive     PIP/TAZO <=4 SENSITIVE Sensitive     * RARE ESCHERICHIA COLI         Radiology Studies: US RENAL  Result Date: 01/31/2021 CLINICAL DATA:  Hydronephrosis. EXAM: RENAL / URINARY TRACT ULTRASOUND COMPLETE COMPARISON:  Included portion from disc aspiration 01/28/2021. Abdominal CT 12/11/2020, lumbar MRI 01/26/2021 FINDINGS: Right Kidney: Renal measurements: 11 x 4.2 x 5.1 cm = volume: 124 mL. Hydronephrosis on prior CT has resolved. There is mild increased renal parenchymal echogenicity. Hypoechoic 2.7 x 2.4 x 2.3 cm area in the superior kidney likely represent cyst when compared with prior MRI, but is not well assessed on the current exam due to habitus. Left Kidney: Renal measurements: 10.3 x 5.3 x 4.7 cm = volume: 133 mL. No hydronephrosis. There is a 2.2 cm parapelvic cyst in the lower kidney. No obvious solid lesion. 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 There is mild bladder wall thickening. Other: None. IMPRESSION: 1. No hydronephrosis. 2. Known hypoechoic area in the superior right kidney likely represents a cyst, but is  not well-defined on the current exam. There is a parapelvic cyst in the lower left kidney.  3. 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 Electronically Signed   By: Narda Rutherford M.D.   On: 01/31/2021 21:37   [place] EKG SITE RITE  Result Date: 01/31/2021 If Site Rite image not attached, placement could not be confirmed due to current cardiac rhythm.  [place] EKG SITE RITE  Result Date: 01/30/2021 If Site Rite image not attached, placement could not be confirmed due to current cardiac rhythm.       Scheduled Meds: . carvedilol  12.5 mg Oral BID  . Chlorhexidine Gluconate Cloth  6 each Topical Daily  . cholecalciferol  1,000 Units Oral BID  . enoxaparin (LOVENOX) injection  40 mg Subcutaneous Q24H  . ezetimibe  10 mg Oral Daily  . feeding supplement  237 mL Oral BID BM  . levothyroxine  100 mcg Oral Q0600  . multivitamin with minerals  1 tablet Oral Daily  . polyethylene glycol  17 g Oral Daily  . senna-docusate  1 tablet Oral QHS  . sodium chloride flush  10-40 mL Intracatheter Q12H  . tamsulosin  0.4 mg Oral QPC breakfast   Continuous Infusions: . sodium chloride 10 mL/hr at 01/30/21 1858  .  ceFAZolin (ANCEF) IV 2 g (02/01/21 1347)     LOS: 5 days   Time spent: Greater than 50% of this time was spent in counseling, explanation of diagnosis, planning of further management, and coordination of care.   Voice Recognition Reubin [place] dictation system was used to create this note, attempts have been made to correct errors. Please contact the author with questions and/or clarifications.   Albertine Grates, MD PhD FACP [facility]  Available via Epic secure chat 7am-7pm for nonurgent issues Please page for urgent issues To page the attending provider between 7A-7P or the covering provider during after hours 7P-7A, please log into the web site [email or web address] and access using universal [place] password for that web site. If you do not have the password, please call the hospital operator.    02/01/2021, 5:15 PM

## 2021-02-01 NOTE — Consult Note (Incomplete)
Physical Medicine and Rehabilitation Consult Reason for Consult: Gait disturbance Referring Physician: Dr.Fang   HPI: Cassidy Austin is a 70 y.o. right-handed female with history of hypertension, hypothyroidism, intractable back pain.  Recent hospitalization 12/04/2020 to 12/13/2020 for pyelonephritis and non-STEMI discharged to skilled nursing facility.  During latest admission she was treated with IV antibiotics x14 days.  CT abdomen pelvis 12/11/2020 showed mild right hydroureteronephrosis due to a 2-3 mm calculus in bladder in the region of the right UVJ.  Prior to skilled nursing facility placement patient independent.  Plans to stay with nephew on discharge.  Presented 01/27/2021 with increasing back pain and difficulty in ambulation radiating to the lower extremities.  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  Intradiscal abscess extending into the right psoas not excluded.  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  Patient underwent CT-guided aspiration 01/28/2021 with cultures growing E. coli.  Infectious disease consulted currently on cefazolin x6 to 8 weeks from 01/28/2021 and end date of 03/14/2021.  Hospital course acute on chronic anemia 8.2 and monitored.  Therapy evaluations completed with recommendations of physical medicine rehab consult due to decreased functional mobility.   Review of Systems  Constitutional: Negative for chills and fever.  HENT: Negative for hearing loss.   Eyes: Negative for double vision.  Respiratory: Negative for cough and shortness of breath.   Cardiovascular: Positive for leg swelling. Negative for chest pain and palpitations.  Gastrointestinal: Positive for constipation. Negative for heartburn, nausea and vomiting.  Genitourinary: Positive for urgency. Negative for dysuria, flank pain and hematuria.  Musculoskeletal: Positive for joint  pain and myalgias.  Skin: Negative for rash.  Neurological: Positive for sensory change and weakness.  All other systems reviewed and are negative.  Past Medical History:  Diagnosis Date  . Complication of anesthesia    hard to wake up after eye surgery in 1970s  . Goiter    with thyroid nodule, FNA 12/15 benign follicular nodule  . Hypercholesterolemia   . Hypertension   . Hypothyroidism   . OA (osteoarthritis) of knee    mod.  Marland Kitchen Spondylolisthesis of lumbar region    Past Surgical History:  Procedure Laterality Date  . COLONOSCOPY    . EYE SURGERY    . THYROID SURGERY  1999   Family History  Problem Relation Age of Onset  . Heart failure Mother   . Breast cancer Neg Hx    Social History:  reports that she has never smoked. She has never used smokeless tobacco. She reports that she does not drink alcohol and does not use drugs. Allergies: No Known Allergies Medications Prior to Admission  Medication Sig Dispense Refill  . ASPERCREME LIDOCAINE 4 % PTCH Apply 1 patch topically daily.    Marland Kitchen aspirin 81 MG chewable tablet Chew 81 mg by mouth every morning.    . carvedilol (COREG) 12.5 MG tablet Take 12.5 mg by mouth 2 (two) times daily.    . cholecalciferol (VITAMIN D) 1000 UNITS tablet Take 1,000 Units by mouth 2 (two) times daily.    . cyclobenzaprine (FLEXERIL) 5 MG tablet Take 5 mg by mouth 3 (three) times daily as needed for muscle spasms.    Marland Kitchen ezetimibe (ZETIA) 10 MG tablet Take 10 mg by mouth daily.    Marland Kitchen gabapentin (NEURONTIN) 100 MG capsule Take 1 capsule (100 mg total) by mouth 3 (three) times daily. 21 capsule 0  .  Infant Care Products [facility]) OINT Apply 1 application topically daily.    Marland Kitchen levothyroxine (SYNTHROID, LEVOTHROID) 100 MCG tablet Take 100 mcg by mouth daily at 6 (six) AM.    . Magnesium 250 MG TABS Take 500 mg by mouth every morning.    . olmesartan-hydrochlorothiazide (BENICAR HCT) 40-25 MG tablet Take 1 tablet by mouth daily.    Marland Kitchen oxyCODONE  (ROXICODONE) 5 MG immediate release tablet Take 1 tablet (5 mg total) by mouth every 6 (six) hours as needed for severe pain. 15 tablet 0  . Skin Protectants, Misc. (MINERIN CREME) CREA Apply 1 application topically daily.    . tamsulosin (FLOMAX) 0.4 MG CAPS capsule Take 1 capsule (0.4 mg total) by mouth daily after breakfast. 30 capsule   . tetrahydrozoline 0.05 % ophthalmic solution Place 1 drop into both eyes daily as needed (dry eyes).    Marland Kitchen tiZANidine (ZANAFLEX) 2 MG tablet Take 1 tablet (2 mg total) by mouth every 8 (eight) hours as needed for muscle spasms. 21 tablet 0  . Turmeric 500 MG CAPS Take 1 capsule by mouth 2 (two) times daily.    . vitamin C (ASCORBIC ACID) 500 MG tablet Take 500 mg by mouth daily.    . traMADol (ULTRAM) 50 MG tablet Take 50 mg by mouth every 6 (six) hours as needed for moderate pain.      Home: Home Living Family/patient expects to be discharged to:: Private residence Living Arrangements: Other (Comment) (plans to go home with nephew for ahwile) Available Help at Discharge: Family,Available PRN/intermittently (nephew works fulltime) Type of Home: [employer] Home Access: Stairs to enter Secretary/administrator of Steps: 2 Entrance Stairs-Rails: None Home Layout: Two level,Bed/bath upstairs Alternate Level Stairs-Number of Steps: full flight Alternate Level Stairs-Rails: Right,Left (initially can reach both then then has to choose) Bathroom Shower/Tub: Health visitor: Standard Home Equipment: Wheelchair - manual Additional Comments: unsure if she has if access to walker  Functional History: Prior Function Level of Independence: Independent Comments: prior to February admission she was independent. At [facility] if she could achieve adequate pain relief she was able to stand with +1 assist and needed assist with ADLs. Functional Status:  Mobility: Bed Mobility Overal bed mobility: Needs Assistance Bed Mobility: Rolling,Sidelying to Sit,Sit  to Sidelying Rolling: Mod assist Sidelying to sit: Mod assist Sit to sidelying: Mod assist General bed mobility comments: Heavy mod assist to log roll, come up to sitting from her side and return to sidelying from sitting.  Most assist needed at trunk to come up and at feel to return.  Pt heavily relying on bed rail for leverage. Transfers Overall transfer level: Needs assistance Equipment used: Rolling walker (2 wheeled) Transfer via Lift Equipment: Stedy Transfers: Sit to/from Stand Sit to Stand: Mod assist,From elevated surface General transfer comment: Heavy mod assist to attempt to stand x 4 from elevated bed.  Pt has a difficult time fitting between the side bars of the steady.  Might be better to try without (two person assist, RW, block both knees). Ambulation/Gait General Gait Details: unable at this time.    ADL: ADL Overall ADL's : Needs assistance/impaired Eating/Feeding: Set up,Bed level Eating/Feeding Details (indicate cue type and reason): vs supported sitting Grooming: Set up,Bed level Grooming Details (indicate cue type and reason): vs supported sitting Upper Body Bathing: Maximal assistance,Sitting Upper Body Bathing Details (indicate cue type and reason): Pt needing BUE support to static sit EOB. Either Max A in seated position or mod A at bed  level to wash back Lower Body Bathing: Maximal assistance,+2 for physical assistance,Bed level Lower Body Bathing Details (indicate cue type and reason): struggled with lateral leans to either side. for pericare would need to complete bed level at this time. Upper Body Dressing : Moderate assistance,Maximal assistance,Sitting Upper Body Dressing Details (indicate cue type and reason): Able to briefly sit with single extremity support to reposition each breast during donning of back brace. Lower Body Dressing: Maximal assistance,+2 for physical assistance,Bed level General ADL Comments: Pt completed bed mobility, sat EOB with BUE  support. Then completed 2/3 partial stands from elevated bed height. Cyd Silence on third trial though pt too fatigued to clear hips at that point.  Cognition: Cognition Overall Cognitive Status: Within Functional Limits for tasks assessed Orientation Level: Oriented X4 Cognition Arousal/Alertness: Awake/alert Behavior During Therapy: WFL for tasks assessed/performed Overall Cognitive Status: Within Functional Limits for tasks assessed  Blood pressure 130/61, pulse 69, temperature 98.2 F (36.8 C), temperature source Oral, resp. rate 18, height 5\' 6"  (1.676 m), weight 114.4 kg, SpO2 99 %. Physical Exam Neurological:     Comments: Patient is alert in no acute distress.  Oriented x3 and follows commands.     Results for orders placed or performed during the hospital encounter of 01/27/21 (from the past 24 hour(s))  CBC with Differential/Platelet     Status: Abnormal   Collection Time: 02/01/21  2:42 AM  Result Value Ref Range   WBC 7.4 4.0 - 10.5 K/uL   RBC 2.94 (L) 3.87 - 5.11 MIL/uL   Hemoglobin 8.2 (L) 12.0 - 15.0 g/dL   HCT 02/03/21 (L) 61.6 - 07.3 %   MCV 88.4 80.0 - 100.0 fL   MCH 27.9 26.0 - 34.0 pg   MCHC 31.5 30.0 - 36.0 g/dL   RDW 71.0 (H) 62.6 - 94.8 %   Platelets 244 150 - 400 K/uL   nRBC 0.0 0.0 - 0.2 %   Neutrophils Relative % 63 %   Neutro Abs 4.7 1.7 - 7.7 K/uL   Lymphocytes Relative 17 %   Lymphs Abs 1.3 0.7 - 4.0 K/uL   Monocytes Relative 10 %   Monocytes Absolute 0.7 0.1 - 1.0 K/uL   Eosinophils Relative 9 %   Eosinophils Absolute 0.7 (H) 0.0 - 0.5 K/uL   Basophils Relative 1 %   Basophils Absolute 0.1 0.0 - 0.1 K/uL   Immature Granulocytes 0 %   Abs Immature Granulocytes 0.03 0.00 - 0.07 K/uL  Basic metabolic panel     Status: Abnormal   Collection Time: 02/01/21  2:42 AM  Result Value Ref Range   Sodium 139 135 - 145 mmol/L   Potassium 3.7 3.5 - 5.1 mmol/L   Chloride 107 98 - 111 mmol/L   CO2 27 22 - 32 mmol/L   Glucose, Bld 107 (H) 70 - 99 mg/dL    BUN 11 8 - 23 mg/dL   Creatinine, Ser 02/03/21 0.44 - 1.00 mg/dL   Calcium 8.5 (L) 8.9 - 10.3 mg/dL   GFR, Estimated 2.70 >35 mL/min   Anion gap 5 5 - 15  Magnesium     Status: None   Collection Time: 02/01/21  2:42 AM  Result Value Ref Range   Magnesium 1.9 1.7 - 2.4 mg/dL   02/03/21 RENAL  Result Date: 01/31/2021 CLINICAL DATA:  Hydronephrosis. EXAM: RENAL / URINARY TRACT ULTRASOUND COMPLETE COMPARISON:  Included portion from disc aspiration 01/28/2021. Abdominal CT 12/11/2020, lumbar MRI 01/26/2021 FINDINGS: Right Kidney: Renal measurements: 11 x  4.2 x 5.1 cm = volume: 124 mL. Hydronephrosis on prior CT has resolved. There is mild increased renal parenchymal echogenicity. Hypoechoic 2.7 x 2.4 x 2.3 cm area in the superior kidney likely represent cyst when compared with prior MRI, but is not well assessed on the current exam due to habitus. Left Kidney: Renal measurements: 10.3 x 5.3 x 4.7 cm = volume: 133 mL. No hydronephrosis. There is a 2.2 cm parapelvic cyst in the lower kidney. No obvious solid lesion. 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 There is mild bladder wall thickening. Other: None. IMPRESSION: 1. No hydronephrosis. 2. Known hypoechoic area in the superior right kidney likely represents a cyst, but is not well-defined on the current exam. There is a parapelvic cyst in the lower left kidney. 3. 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 Electronically Signed   By: Narda RutherfordMelanie  Sanford M.D.   On: 01/31/2021 21:37   US EKG SITE RITE  Result Date: 01/31/2021 If Site Rite image not attached, placement could not be confirmed due to current cardiac rhythm.  US EKG SITE RITE  Result Date: 01/30/2021 If Site Rite image not attached, placement could not be confirmed due to current cardiac rhythm.   ***  Mcarthur RossettiDaniel J Angiulli, PA-C 02/01/2021

## 2021-02-01 NOTE — Progress Notes (Signed)
[facility] Infectious Diseases Follow Up Note  Patient Identification: Patient Name: Cassidy Austin MRN: [id number] [place] Date: 01/27/2021  3:09 PM Age: 70 y.o.Today's Date: 02/01/2021   Reason for Visit: Discitis and osteomyelitis  Principal Problem:   Discitis/osteomyelitis of lumbar region Active Problems:   Hypothyroidism   Hypertension   Intractable pain   Impaired ambulation   Protein-calorie malnutrition, severe  Antibiotics: Ceftriaxone 3/29-c                    Total days of antibiotics 3  Lines/Tubes:PIVs,   Interval Events: Afebrile,, no leukocytosis, hemodynamically stable   Assessment 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 coli   Recommendations Recommend to change ceftriaxone to cefazolin Plan to treat for 6 to 8 weeks from 3/27 PICC line in place Monitor CBC and CMP on IV antibiotics OPAT orders placed Follow-up with myself has been made on April 14 at 11:30 AM Will sign off for now  Diagnosis: Discitis and osteomyelitis  Culture Result: E. coli  No Known Allergies  OPAT Orders Discharge antibiotics to be given via PICC line Discharge antibiotics: Cefazolin  Per pharmacy protocol  Aim for Vancomycin trough 15-20 or AUC 400-550 (unless otherwise indicated) Duration: 6 weeks   End Date: 03/11/21  [facility] Care Per Protocol:  Home health RN for IV administration and teaching; PICC line care and labs.    Labs weekly while on IV antibiotics: X_ CBC with differential X__ BMP __ CMP X__ CRP X__ ESR __ Vancomycin trough __ CK  __ Please pull PIC at completion of IV antibiotics X__ Please leave PIC in place until doctor has seen patient or been notified  Fax weekly labs to [phone]  Clinic Follow Up Appt: April 14   '@11'$ :30 am   Rest of  the management as per the primary team. Thank you for the consult. Please page with pertinent questions or concerns.  ______________________________________________________________________ Subjective patient seen and examined at the bedside.  She is sitting up in bed and having breakfast.  Back pain is significant improved.  Denies nausea vomiting diarrhea and rashes.  Discussed regarding antibiotic plan and follow-up.  Vitals BP (!) 127/51 (BP Location: Left Arm)   Pulse 65   Temp 98.3 F (36.8 C) (Oral)   Resp 18   Ht $R'5\' 6"'Ok$  (1.676 m)   Wt 114.4 kg   SpO2 98%   BMI 40.71 kg/m     Physical Exam Constitutional:   Not in acute distress and eating breakfast    Comments:   Cardiovascular:     Rate and Rhythm: Normal rate and regular rhythm.     Heart sounds:   Pulmonary:     Effort: Pulmonary effort is normal.     Comments:   Abdominal:     Palpations: Abdomen is soft.     Tenderness:   Musculoskeletal:        General: No swelling or tenderness.   Skin:    Comments: No obvious rashes  Neurological:     General: No focal deficit present.   Psychiatric:        Mood and Affect: Mood normal.    Pertinent Microbiology Results for orders placed or performed during the hospital encounter of 01/27/21  Culture, blood (routine x 2)     Status: None (Preliminary result)   Collection Time: 01/27/21  6:33 PM   Specimen: BLOOD  Result Value Ref Range Status   Specimen Description BLOOD LEFT ANTECUBITAL  Final   Special Requests   Final    BOTTLES DRAWN AEROBIC AND ANAEROBIC Blood Culture adequate volume   Culture   Final    NO GROWTH 4 DAYS Performed at [facility], [address] [place], [place] [address]    Report Status PENDING  Incomplete  Resp Panel by RT-PCR (Flu A&B, Covid) Nasopharyngeal Swab     Status: None   Collection Time: 01/27/21  6:33 PM   Specimen: Nasopharyngeal Swab; Nasopharyngeal(NP) swabs in vial transport medium  Result Value Ref Range  Status   SARS Coronavirus 2 by RT PCR NEGATIVE NEGATIVE Final    Comment: (NOTE) SARS-CoV-2 target nucleic acids are NOT DETECTED.  The SARS-CoV-2 RNA is generally detectable in upper respiratory specimens during the acute phase of infection. The lowest concentration of SARS-CoV-2 viral copies this assay can detect is 138 copies/mL. A negative result does not preclude SARS-Cov-2 infection and should not be used as the sole basis for treatment or other patient management decisions. A negative result may occur with  improper specimen collection/handling, submission of specimen other than nasopharyngeal swab, presence of viral mutation(s) within the areas targeted by this assay, and inadequate number of viral copies(<138 copies/mL). A negative result must be combined with clinical observations, patient history, and epidemiological information. The expected result is Negative.  Fact Sheet for Patients:  EntrepreneurPulse.com.au  Fact Sheet for Healthcare Providers:  IncredibleEmployment.be  This test is no t yet approved or cleared by the [place] FDA and  has been authorized for detection and/or diagnosis of SARS-CoV-2 by FDA under an Emergency Use Authorization (EUA). This EUA will remain  in effect (meaning this test can be used) for the duration of the COVID-19 declaration under Section 564(b)(1) of the Act, 21 U.S.C.section 360bbb-3(b)(1), unless the authorization is terminated  or revoked sooner.       Influenza A by PCR NEGATIVE NEGATIVE Final   Influenza B by PCR NEGATIVE NEGATIVE Final    Comment: (NOTE) The Xpert Xpress SARS-CoV-2/FLU/RSV plus assay is intended as an aid in the diagnosis of influenza from Nasopharyngeal swab specimens and should not be used as a sole basis for treatment. Nasal washings and aspirates are unacceptable for Xpert Xpress SARS-CoV-2/FLU/RSV testing.  Fact Sheet for  Patients: EntrepreneurPulse.com.au  Fact Sheet for Healthcare Providers: IncredibleEmployment.be  This test is not yet approved or cleared by the [place] FDA and has been authorized for detection and/or diagnosis of SARS-CoV-2 by FDA under an Emergency Use Authorization (EUA). This EUA will remain in effect (meaning this test can be used) for the duration of the COVID-19 declaration under Section 564(b)(1) of the Act, 21 U.S.C. section 360bbb-3(b)(1), unless the authorization is terminated or revoked.  Performed at [facility], [place] [address] [place], [place] [address]   Urine culture     Status: Abnormal   Collection Time: 01/27/21  6:33 PM   Specimen: Urine, Random  Result Value Ref Range Status   Specimen Description URINE, RANDOM  Final   Special Requests   Final    NONE Performed at [facility], [place] [address], [place], [place] [address]    Culture MULTIPLE SPECIES PRESENT, SUGGEST RECOLLECTION (A)  Final   Report Status 01/29/2021 FINAL  Final  Aerobic/Anaerobic Culture (surgical/deep wound)     Status: None (Preliminary result)   Collection Time: 01/28/21 10:47 AM   Specimen: Fluid; Tissue  Result Value Ref Range Status   Specimen Description FLUID L1/L2 INTERVERTEBRAL DISC AND PARASPINAL  Final   Special Requests NONE  Final   Gram Stain   Final    MODERATE WBC PRESENT, PREDOMINANTLY PMN NO ORGANISMS SEEN Performed at [facility], [address] [place], [place] [address]    Culture   Final    RARE ESCHERICHIA COLI NO ANAEROBES ISOLATED; CULTURE IN PROGRESS FOR 5 DAYS    Report Status PENDING  Incomplete   Organism ID, Bacteria ESCHERICHIA COLI  Final      Susceptibility   Escherichia coli - MIC*    AMPICILLIN 4 SENSITIVE Sensitive     CEFAZOLIN <=4 SENSITIVE Sensitive     CEFEPIME <=0.12 SENSITIVE Sensitive     CEFTAZIDIME <=1 SENSITIVE Sensitive     CEFTRIAXONE <=0.25 SENSITIVE Sensitive      CIPROFLOXACIN <=0.25 SENSITIVE Sensitive     GENTAMICIN <=1 SENSITIVE Sensitive     IMIPENEM <=0.25 SENSITIVE Sensitive     TRIMETH/SULFA <=20 SENSITIVE Sensitive     AMPICILLIN/SULBACTAM <=2 SENSITIVE Sensitive     PIP/TAZO <=4 SENSITIVE Sensitive     * RARE ESCHERICHIA COLI    Pertinent Lab. CBC Latest Ref Rng & Units 02/01/2021 01/31/2021 01/30/2021  WBC 4.0 - 10.5 K/uL 7.4 7.1 7.8  Hemoglobin 12.0 - 15.0 g/dL 8.2(L) 8.9(L) 8.9(L)  Hematocrit 36.0 - 46.0 % 26.0(L) 28.9(L) 28.0(L)  Platelets 150 - 400 K/uL 244 230 245   CMP Latest Ref Rng & Units 02/01/2021 01/30/2021 01/29/2021  Glucose 70 - 99 mg/dL 107(H) 119(H) 124(H)  BUN 8 - 23 mg/dL $Remove'11 9 10  'DncvBiT$ Creatinine 0.44 - 1.00 mg/dL 0.74 0.67 0.83  Sodium 135 - 145 mmol/L 139 139 139  Potassium 3.5 - 5.1 mmol/L 3.7 3.7 3.6  Chloride 98 - 111 mmol/L 107 106 106  CO2 22 - 32 mmol/L $RemoveB'27 25 28  'smjwVJrA$ Calcium 8.9 - 10.3 mg/dL 8.5(L) 8.5(L) 8.9  Total Protein 6.5 - 8.1 g/dL - 6.2(L) 6.8  Total Bilirubin 0.3 - 1.2 mg/dL - 0.4 0.5  Alkaline Phos 38 - 126 U/L - 65 69  AST 15 - 41 U/L - 10(L) 12(L)  ALT 0 - 44 U/L - 10 10     Pertinent Imaging today Plain films and CT images have been personally visualized and interpreted; radiology reports have been reviewed. Decision making incorporated into the Impression / Recommendations.  US renal 01/31/21 FINDINGS: Right Kidney:  Renal measurements: 11 x 4.2 x 5.1 cm = volume: 124 mL. Hydronephrosis on prior CT has resolved. There is mild increased renal parenchymal echogenicity. Hypoechoic 2.7 x 2.4 x 2.3 cm area in the superior kidney likely represent cyst when compared with prior MRI, but is not well assessed on the current exam due to habitus.  Left Kidney:  Renal measurements: 10.3 x 5.3 x 4.7 cm = volume: 133 mL. No hydronephrosis. There is a 2.2 cm parapelvic cyst in the lower kidney. No obvious solid lesion.  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 There is  mild bladder wall thickening.  Other:  None.  IMPRESSION: 1. No hydronephrosis. 2. Known hypoechoic area in the superior right kidney likely represents a cyst, but is not well-defined on the current exam. There is a parapelvic cyst in the lower left kidney. 3. 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   I have spent approx 30 minutes for this patient encounter including review of prior medical records, coordination of care  with greater than 50% of time being face to face/counseling and discussing diagnostics/treatment plan with the patient/family.  Electronically signed by:   Rosiland Oz, MD Infectious Disease Physician [facility] Pager: [phone]

## 2021-02-01 NOTE — Plan of Care (Signed)

## 2021-02-02 DIAGNOSIS — M4646 Discitis, unspecified, lumbar region: Secondary | ICD-10-CM | POA: Diagnosis not present

## 2021-02-02 LAB — AEROBIC/ANAEROBIC CULTURE W GRAM STAIN (SURGICAL/DEEP WOUND)

## 2021-02-02 NOTE — Progress Notes (Signed)
Inpatient Rehab Admissions Coordinator:   Met with patient at bedside to discuss goals/expectations of CIR stay.  We discussed length of stay estimate to be about 3 weeks, and dependant on progress.  Pt can stay with her nephew at discharge.  He works during the day but she can have other friends spend some time with her if needed.  Her bedroom would likely be on the second floor, but could potentially stay on the first floor if needed.  I will need insurance auth from [place] so will start that process today.    Shann Medal, PT, DPT Admissions Coordinator [phone] 02/02/21  1:18 PM

## 2021-02-02 NOTE — Progress Notes (Incomplete)
PMR Admission Coordinator Pre-Admission Assessment   Patient: Cassidy Austin is an 70 y.o., female MRN: [id number] DOB: 03/29/1951 Height: _0  (167.6 cm) Weight: 114.4 kg   Insurance Information HMO: ***    PPO: ***     PCP:      IPA:      80/20:      OTHER:  PRIMARY: [employer]      Policy#: [id number]      Subscriber: pt CM Name: ***      Phone#: ***     Fax#: [phone] Pre-Cert#: ***      Employer:  Benefits:  Phone #: [phone]     Name:  Cassidy Austin. Date: ***     Deduct: ***      Out of Pocket Max: ***      Life Max: *** CIR: ***      SNF: *** Outpatient: ***     Co-Pay: *** Home Health: ***      Co-Pay: *** DME: ***     Co-Pay: *** Providers: *** SECONDARY:       Policy#:      Phone#:    Financial Counselor:       Phone#:    The "Data Collection Information Summary" for patients in Inpatient Rehabilitation Facilities with attached "Privacy Act Enterprise Records" was provided and verbally reviewed with: Patient   Emergency Contact Information         Contact Information     Name Relation Home Work [place], [place] Relative     [phone]    Alperin,Curtis Brother     [phone]    Poullard,Linwood Brother     [phone]         Current Medical History  Patient Admitting Diagnosis: LE weakness 2/2 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 right-handed female with history of hypertension, hypothyroidism, intractable back pain.  Recent hospitalization 12/04/2020 to 12/13/2020 for pyelonephritis and non-STEMI, discharged to skilled nursing facility for short term rehab.  During latest admission she was treated with IV antibiotics x14 days.  CT abdomen pelvis 12/11/2020 showed mild right hydroureteronephrosis due to a 2-3 mm calculus in bladder in the region of the right UVJ.  Pt presented 01/27/2021 with increasing back pain, radiating to the lower extremities, and difficulty in ambulation.   Outpatient MRI imaging 01/26/2021 showed evidence of discitis/osteomyelitis at L1-L2 with paraspinal edema.  Intradiscal abscess extending into the right psoas not excluded.  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  Patient underwent CT-guided aspiration 01/28/2021 with cultures growing E. coli.  Infectious disease consulted currently on cefazolin x6 to 8 weeks from 01/28/2021 and end date of 03/14/2021.  Hospital course acute on chronic anemia 8.2 and monitored.  Therapy evaluations completed with recommendations of physical medicine rehab consult due to decreased functional mobility.     Patient's medical record from [facility] has been reviewed by the rehabilitation admission coordinator and physician.   Past Medical History      Past Medical History:  Diagnosis Date  . Complication of anesthesia      hard to wake up after eye surgery in 1970s  . Goiter      with thyroid nodule, FNA 89/37 benign follicular nodule  . Hypercholesterolemia    . Hypertension    . Hypothyroidism    . OA (osteoarthritis) of knee      mod.  Marland Kitchen Spondylolisthesis of lumbar region  Family History   family history includes Heart failure in her mother.   Prior Rehab/Hospitalizations Has the patient had prior rehab or hospitalizations prior to admission? Yes   Has the patient had major surgery during 100 days prior to admission? No               Current Medications   Current Facility-Administered Medications:  .  0.9 %  sodium chloride infusion, , Intravenous, Continuous, Cherene Altes, MD, Last Rate: 10 mL/hr at 01/30/21 1858, Rate Change at 01/30/21 1858 .  acetaminophen (TYLENOL) tablet 650 mg, 650 mg, Oral, Q6H PRN **OR** [DISCONTINUED] acetaminophen (TYLENOL) suppository 650 mg, 650 mg, Rectal, Q6H PRN, Judd Gaudier V, MD .  carvedilol (COREG) tablet 12.5 mg, 12.5 mg, Oral, BID, Cherene Altes, MD, 12.5 mg at 02/02/21 0935 .  ceFAZolin  (ANCEF) IVPB 2g/100 mL premix, 2 g, Intravenous, Q8H, Manandhar, Sabina, MD, Last Rate: 200 mL/hr at 02/01/21 2217, 2 g at 02/01/21 2217 .  Chlorhexidine Gluconate Cloth 2 % PADS 6 each, 6 each, Topical, Daily, Florencia Reasons, MD, 6 each at 02/02/21 [phone] .  cholecalciferol (VITAMIN D3) tablet 1,000 Units, 1,000 Units, Oral, BID, Cherene Altes, MD, 1,000 Units at 02/02/21 0935 .  enoxaparin (LOVENOX) injection 40 mg, 40 mg, Subcutaneous, Q24H, Florencia Reasons, MD, 40 mg at 02/01/21 2211 .  ezetimibe (ZETIA) tablet 10 mg, 10 mg, Oral, Daily, Cherene Altes, MD, 10 mg at 02/02/21 0935 .  feeding supplement (ENSURE ENLIVE / ENSURE PLUS) liquid 237 mL, 237 mL, Oral, BID BM, Cherene Altes, MD, 237 mL at 01/31/21 1009 .  HYDROmorphone (DILAUDID) injection 0.5-1 mg, 0.5-1 mg, Intravenous, Q2H PRN, Fenton Malling L, NP, 1 mg at 01/31/21 1720 .  levothyroxine (SYNTHROID) tablet 100 mcg, 100 mcg, Oral, Q0600, Cherene Altes, MD, 100 mcg at 02/02/21 0736 .  metaxalone (SKELAXIN) tablet 400 mg, 400 mg, Oral, TID PRN, Cherene Altes, MD, 400 mg at 02/02/21 1024 .  multivitamin with minerals tablet 1 tablet, 1 tablet, Oral, Daily, Cherene Altes, MD, 1 tablet at 02/02/21 0935 .  naphazoline-glycerin (CLEAR EYES REDNESS) ophth solution 1-2 drop, 1-2 drop, Both Eyes, QID PRN, Cherene Altes, MD .  ondansetron (ZOFRAN) tablet 4 mg, 4 mg, Oral, Q6H PRN **OR** ondansetron (ZOFRAN) injection 4 mg, 4 mg, Intravenous, Q6H PRN, Judd Gaudier V, MD .  oxyCODONE (Oxy IR/ROXICODONE) immediate release tablet 5-10 mg, 5-10 mg, Oral, Q4H PRN, Cherene Altes, MD, 10 mg at 02/02/21 0946 .  polyethylene glycol (MIRALAX / GLYCOLAX) packet 17 g, 17 g, Oral, Daily, Florencia Reasons, MD, 17 g at 02/02/21 0935 .  senna-docusate (Senokot-S) tablet 1 tablet, 1 tablet, Oral, QHS, Florencia Reasons, MD, 1 tablet at 02/01/21 2211 .  sodium chloride flush (NS) 0.9 % injection 10-40 mL, 10-40 mL, Intracatheter, Q12H, Florencia Reasons, MD, 10 mL at  02/02/21 0936 .  sodium chloride flush (NS) 0.9 % injection 10-40 mL, 10-40 mL, Intracatheter, PRN, Florencia Reasons, MD .  tamsulosin [facility]) capsule 0.4 mg, 0.4 mg, Oral, QPC breakfast, Cherene Altes, MD, 0.4 mg at 02/02/21 0935 .  traMADol (ULTRAM) tablet 50 mg, 50 mg, Oral, Q6H PRN, Cherene Altes, MD, 50 mg at 02/01/21 1353   Patients Current Diet:     Diet Order                      Diet regular Room service appropriate? Yes; Fluid consistency: Thin  Diet effective now  Precautions / Restrictions Precautions Precautions: Fall,Back Precaution Booklet Issued: No Precaution Comments: verbally reviewed back precautions Spinal Brace: Lumbar corset,Applied in sitting position Restrictions Weight Bearing Restrictions: No    Has the patient had 2 or more falls or a fall with injury in the past year? No   Prior Activity Level Community (5-7x/wk): independent prior to admit, retired, driving, no DME at baseline   Prior Functional Level Self Care: Did the patient need help bathing, dressing, using the toilet or eating? Independent   Indoor Mobility: Did the patient need assistance with walking from room to room (with or without device)? Independent   Stairs: Did the patient need assistance with internal or external stairs (with or without device)? Independent   Functional Cognition: Did the patient need help planning regular tasks such as shopping or remembering to take medications? Independent   Home Assistive Devices / Equipment Home Assistive Devices/Equipment: None Home Equipment: Wheelchair - manual   Prior Device Use: Indicate devices/aids used by the patient prior to current illness, exacerbation or injury? None of the above   Current Functional Level Cognition   Overall Cognitive Status: Within Functional Limits for tasks assessed Orientation Level: Oriented X4    Extremity Assessment (includes Sensation/Coordination)   Upper Extremity  Assessment: Defer to OT evaluation  Lower Extremity Assessment: Generalized weakness (3-/5 grossly equal bil LEs per bed level assessment, sensation intact bil)     ADLs   Overall ADL's : Needs assistance/impaired Eating/Feeding: Set up,Bed level Eating/Feeding Details (indicate cue type and reason): vs supported sitting Grooming: Set up,Bed level Grooming Details (indicate cue type and reason): vs supported sitting Upper Body Bathing: Maximal assistance,Sitting Upper Body Bathing Details (indicate cue type and reason): Pt needing BUE support to static sit EOB. Either Max A in seated position or mod A at bed level to wash back Lower Body Bathing: Maximal assistance,+2 for physical assistance,Bed level Lower Body Bathing Details (indicate cue type and reason): struggled with lateral leans to either side. for pericare would need to complete bed level at this time. Upper Body Dressing : Moderate assistance,Maximal assistance,Sitting Upper Body Dressing Details (indicate cue type and reason): Able to briefly sit with single extremity support to reposition each breast during donning of back brace. Lower Body Dressing: Maximal assistance,+2 for physical assistance,Bed level General ADL Comments: Pt completed bed mobility, sat EOB with BUE support. Then completed 2/3 partial stands from elevated bed height. Jeral Fruit on third trial though pt too fatigued to clear hips at that point.     Mobility   Overal bed mobility: Needs Assistance Bed Mobility: Rolling,Sidelying to Sit,Sit to Sidelying Rolling: Mod assist Sidelying to sit: Mod assist Sit to sidelying: Mod assist General bed mobility comments: Heavy mod assist to log roll, come up to sitting from her side and return to sidelying from sitting.  Most assist needed at trunk to come up and at feel to return.  Pt heavily relying on bed rail for leverage.     Transfers   Overall transfer level: Needs assistance Equipment used: Rolling walker (2  wheeled) Transfer via Lift Equipment: Stedy Transfers: Sit to/from Stand Sit to Stand: Mod assist,From elevated surface General transfer comment: Heavy mod assist to attempt to stand x 4 from elevated bed.  Pt has a difficult time fitting between the side bars of the steady.  Might be better to try without (two person assist, RW, block both knees).     Ambulation / Gait / Stairs / Emergency planning/management officer  Ambulation/Gait General Gait Details: unable at this time.     Posture / Balance Dynamic Sitting Balance Sitting balance - Comments: needs bil UE support in sitting, posterior lean Balance Overall balance assessment: Needs assistance Sitting-balance support: Feet supported,Bilateral upper extremity supported Sitting balance-Leahy Scale: Poor Sitting balance - Comments: needs bil UE support in sitting, posterior lean Standing balance support: Bilateral upper extremity supported Standing balance comment: pt unable to achieve fully upright stand, most of her partial stands were flexed over the front of the steady standing frame.     Special needs/care consideration Continuous Drip IV  ancef (200 ml/hr q8 hrs) and Special service needs neuropsych for coping    Previous Home Environment (from acute therapy documentation) Living Arrangements: Other (Comment) (plans to go home with nephew for ahwile) Available Help at Discharge: Family,Available PRN/intermittently (nephew works fulltime) Type of Home: [place] Name: [place]: Two level,Bed/bath upstairs Alternate Level Stairs-Rails: Right,Left (initially can reach both then then has to choose) Alternate Level Stairs-Number of Steps: full flight Home Access: Stairs to enter Entrance Stairs-Rails: None Entrance Stairs-Number of Steps: 2 Bathroom Shower/Tub: Multimedia programmer: [place]: No Additional Comments: unsure if she has if access to walker   Discharge Living Setting Plans for  Discharge Living Setting: Lives with (comment) (nephew at discharge) Type of Home at Discharge: House Discharge Home Layout: Two level,Full bath on main level,Bed/bath upstairs Alternate Level Stairs-Rails: Right,Left Alternate Level Stairs-Number of Steps: full flight Discharge Home Access: Stairs to enter Entrance Stairs-Rails: None Entrance Stairs-Number of Steps: 2 Discharge Bathroom Shower/Tub: Horticulturist, commercial: Standard Discharge Bathroom Accessibility: Yes How Accessible: Accessible via walker Does the patient have any problems obtaining your medications?: No   Social/Family/Support Systems Anticipated Caregiver: nephew, Darren Anticipated Ambulance person Information: [phone] Ability/Limitations of Caregiver: works during the day, pt can have other caregivers (friends) present throughout the day if she needs supervision/assist Caregiver Availability: Other (Comment) Discharge Plan Discussed with Primary Caregiver: Yes Is Caregiver In Agreement with Plan?: Yes Does Caregiver/Family have Issues with Lodging/Transportation while Pt is in Rehab?: No   Goals Patient/Family Goal for Rehab: PT/OT min assist, possibly w/c level Expected length of stay: 21-25 days Pt/Family Agrees to Admission and willing to participate: Yes Program Orientation Provided & Reviewed with Pt/Caregiver Including Roles  & Responsibilities: Yes  Barriers to Discharge: Insurance for SNF coverage,Home environment access/layout   Decrease burden of Care through IP rehab admission: n/a  Possible need for SNF placement upon discharge: not anticipated.  Pt has good support from nephew and her friends, who can be available to provide assist as needed.    Patient Condition: I have reviewed medical records from [facility], spoken with CM, and patient. I met with patient at the bedside for inpatient rehabilitation assessment.  Patient will benefit from ongoing PT and OT, can actively  participate in 3 hours of therapy a day 5 days of the week, and can make measurable gains during the admission.  Patient will also benefit from the coordinated team approach during an Inpatient Acute Rehabilitation admission.  The patient will receive intensive therapy as well as Rehabilitation physician, nursing, social worker, and care management interventions.  Due to bladder management, bowel management, safety, skin/wound care, disease management, medication administration, pain management and patient education the patient requires 24 hour a day rehabilitation nursing.  The patient is currently max +2 with mobility and basic ADLs.  Discharge setting and therapy post discharge at home with  home health is anticipated.  Patient has agreed to participate in the Acute Inpatient Rehabilitation Program and will admit pending insurance auth ***.   Preadmission Screen Completed By:  Michel Santee, PT, DPT 02/02/2021 1:33 PM

## 2021-02-02 NOTE — Progress Notes (Signed)
Physical Therapy Treatment Patient Details Name: Cassidy Austin MRN: [id number] DOB: 26-Dec-1950 Today's Date: 02/02/2021    History of Present Illness 70 y.o. female admitted from [facility] on 01/27/21 for back pain.  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  Pt s/p CT guided aspiration of R psoas fluid collection and disc space L1-2.  Pt with significant PMH of recent hospitalization 1/31-12/13/20 for pylonephritis and NSTEMI d/c to SNF for rehab, HTN.    PT Comments    Pt was able to stand multiple times until fatigue with two person assist and RW.  She was not able to shift weight enough to take steps around to the chair safely, so we used the maxi move for OOB to chair.  She was able to participate in UE/LE exercises from the chair.  She remains motivated to get back on her feet and works very hard each session.  PT will continue to follow acutely for safe mobility progression.   Follow Up Recommendations  CIR     Equipment Recommendations  Wheelchair cushion (measurements PT);Wheelchair (measurements PT);Hospital bed (hoyer lift)    Recommendations for Other Services Rehab consult     Precautions / Restrictions Precautions Precautions: Fall;Back Precaution Booklet Issued: No Precaution Comments: verbally reviewed back precautions (back for comfort) Required Braces or Orthoses: Spinal Brace Spinal Brace: Lumbar corset;Applied in sitting position    Mobility  Bed Mobility Overal bed mobility: Needs Assistance Bed Mobility: Rolling;Sidelying to Sit;Sit to Sidelying Rolling: Mod assist Sidelying to sit: Mod assist     Sit to sidelying: Mod assist General bed mobility comments: Mod assist to help control hips and legs during log roll transition up to sitting and back down to side lying.    Transfers Overall transfer level: Needs assistance Equipment used: Rolling walker (2 wheeled) Transfers: Sit to/from Stand Sit to Stand: Mod assist;+2  physical assistance;From elevated surface         General transfer comment: Mod assist with R knee blocked from elevated bed x4, slowly fatigued the more we tried.  Transitioned OOB to the recliner chair with maxi move lift for safety.  Ambulation/Gait             General Gait Details: Unable to weight shift to move feet   Stairs             Wheelchair Mobility    Modified Rankin (Stroke Patients Only)       Balance Overall balance assessment: Needs assistance Sitting-balance support: Feet supported;Bilateral upper extremity supported Sitting balance-Leahy Scale: Poor Sitting balance - Comments: needs bil UE support in sitting, posterior lean   Standing balance support: Bilateral upper extremity supported Standing balance-Leahy Scale: Poor Standing balance comment: able to stand today with heavy reliance on knee block and RW for support.  She stood fully upright 30-45 seconds for the fisrt two stands 15-20 seconds for the last two.                            Cognition Arousal/Alertness: Awake/alert Behavior During Therapy: WFL for tasks assessed/performed Overall Cognitive Status: Within Functional Limits for tasks assessed                                        Exercises General Exercises - Upper Extremity Shoulder Flexion: AROM;Both;10 reps Elbow Flexion: AROM;Both;10 reps General Exercises - Lower Extremity  Long Arc Quad: AROM;Both;10 reps Toe Raises: AROM;Both;10 reps Heel Raises: AROM;Both;10 reps    General Comments        Pertinent Vitals/Pain Pain Assessment: Faces Faces Pain Scale: Hurts even more Pain Location: low back and left leg today Pain Descriptors / Indicators: Grimacing;Spasm Pain Intervention(s): Monitored during session;Limited activity within patient's tolerance;Premedicated before session;Repositioned    Home Living                      Prior Function            PT Goals (current  goals can now be found in the care plan section) Acute Rehab PT Goals Patient Stated Goal: to go home if she can, but realizes she may need rehab, to walk again, return to her PLOF Progress towards PT goals: Progressing toward goals    Frequency    Min 3X/week      PT Plan Current plan remains appropriate    Co-evaluation              AM-PAC PT "6 Clicks" Mobility   Outcome Measure  Help needed turning from your back to your side while in a flat bed without using bedrails?: A Lot Help needed moving from lying on your back to sitting on the side of a flat bed without using bedrails?: A Lot Help needed moving to and from a bed to a chair (including a wheelchair)?: A Lot Help needed standing up from a chair using your arms (e.g., wheelchair or bedside chair)?: A Lot Help needed to walk in hospital room?: Total Help needed climbing 3-5 steps with a railing? : Total 6 Click Score: 10    End of Session Equipment Utilized During Treatment: Back brace;Gait belt Activity Tolerance: Patient limited by pain Patient left: in chair;with call bell/phone within reach   PT Visit Diagnosis: Muscle weakness (generalized) (M62.81);Difficulty in walking, not elsewhere classified (R26.2);Other symptoms and signs involving the nervous system (W26.378)     Time: 5885-0277 PT Time Calculation (min) (ACUTE ONLY): 52 min  Charges:  $Therapeutic Activity: 38-52 mins                     Corinna Capra, PT, DPT  Acute Rehabilitation [phone] pager [phone]) [phone] office

## 2021-02-02 NOTE — PMR Pre-admission (Shared)
PMR Admission Coordinator Pre-Admission Assessment  Patient: Cassidy Austin is an 70 y.o., female MRN: [id number] DOB: 1951/09/17 Height: 5' 6" (167.6 cm) Weight: 114.4 kg  Insurance Information HMO: ***    PPO: ***     PCP:      IPA:      80/20:      OTHER:  PRIMARY: [employer]      Policy#: [id number]      Subscriber: pt CM Name: ***      Phone#: ***     Fax#: [phone] Pre-Cert#: ***      Employer:  Benefits:  Phone #: [phone]     Name:  Cassidy Austin. Date: ***     Deduct: ***      Out of Pocket Max: ***      Life Max: *** CIR: ***      SNF: *** Outpatient: ***     Co-Pay: *** Home Health: ***      Co-Pay: *** DME: ***     Co-Pay: *** Providers: *** SECONDARY:       Policy#:      Phone#:   Financial Counselor:       Phone#:   The "Data Collection Information Summary" for patients in Inpatient Rehabilitation Facilities with attached "Privacy Act [place] Records" was provided and verbally reviewed with: Patient  Emergency Contact Information Contact Information    Name Relation Home Work [place], [place] Relative   [phone]   Austin,Cassidy Brother   [phone]   Austin,Cassidy Brother   [phone]      Current Medical History  Patient Admitting Diagnosis: LE weakness 2/2 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 right-handed female with history of hypertension, hypothyroidism, intractable back pain.  Recent hospitalization 12/04/2020 to 12/13/2020 for pyelonephritis and non-STEMI, discharged to skilled nursing facility for short term rehab.  During latest admission she was treated with IV antibiotics x14 days.  CT abdomen pelvis 12/11/2020 showed mild right hydroureteronephrosis due to a 2-3 mm calculus in bladder in the region of the right UVJ.  Pt presented 01/27/2021 with increasing back pain, radiating to the lower extremities, and difficulty in ambulation.  Outpatient MRI imaging 01/26/2021  showed evidence of discitis/osteomyelitis at L1-L2 with paraspinal edema.  Intradiscal abscess extending into the right psoas not excluded.  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  Patient underwent CT-guided aspiration 01/28/2021 with cultures growing E. coli.  Infectious disease consulted currently on cefazolin x6 to 8 weeks from 01/28/2021 and end date of 03/14/2021.  Hospital course acute on chronic anemia 8.2 and monitored.  Therapy evaluations completed with recommendations of physical medicine rehab consult due to decreased functional mobility.     Patient's medical record from [facility] has been reviewed by the rehabilitation admission coordinator and physician.  Past Medical History  Past Medical History:  Diagnosis Date  . Complication of anesthesia    hard to wake up after eye surgery in 1970s  . Goiter    with thyroid nodule, FNA 80/03 benign follicular nodule  . Hypercholesterolemia   . Hypertension   . Hypothyroidism   . OA (osteoarthritis) of knee    mod.  Marland Kitchen Spondylolisthesis of lumbar region     Family History   family history includes Heart failure in her mother.  Prior Rehab/Hospitalizations Has the patient had prior rehab or hospitalizations prior to admission? Yes  Has the patient had major surgery during 100 days  prior to admission? No   Current Medications  Current Facility-Administered Medications:  .  0.9 %  sodium chloride infusion, , Intravenous, Continuous, Cherene Altes, MD, Last Rate: 10 mL/hr at 01/30/21 1858, Rate Change at 01/30/21 1858 .  acetaminophen (TYLENOL) tablet 650 mg, 650 mg, Oral, Q6H PRN **OR** [DISCONTINUED] acetaminophen (TYLENOL) suppository 650 mg, 650 mg, Rectal, Q6H PRN, Judd Gaudier V, MD .  carvedilol (COREG) tablet 12.5 mg, 12.5 mg, Oral, BID, Cherene Altes, MD, 12.5 mg at 02/02/21 0935 .  ceFAZolin (ANCEF) IVPB 2g/100 mL premix, 2 g, Intravenous, Q8H, Manandhar,  Sabina, MD, Last Rate: 200 mL/hr at 02/01/21 2217, 2 g at 02/01/21 2217 .  Chlorhexidine Gluconate Cloth 2 % PADS 6 each, 6 each, Topical, Daily, Florencia Reasons, MD, 6 each at 02/02/21 [phone] .  cholecalciferol (VITAMIN D3) tablet 1,000 Units, 1,000 Units, Oral, BID, Cherene Altes, MD, 1,000 Units at 02/02/21 0935 .  enoxaparin (LOVENOX) injection 40 mg, 40 mg, Subcutaneous, Q24H, Florencia Reasons, MD, 40 mg at 02/01/21 2211 .  ezetimibe (ZETIA) tablet 10 mg, 10 mg, Oral, Daily, Cherene Altes, MD, 10 mg at 02/02/21 0935 .  feeding supplement (ENSURE ENLIVE / ENSURE PLUS) liquid 237 mL, 237 mL, Oral, BID BM, Cherene Altes, MD, 237 mL at 01/31/21 1009 .  HYDROmorphone (DILAUDID) injection 0.5-1 mg, 0.5-1 mg, Intravenous, Q2H PRN, Fenton Malling L, NP, 1 mg at 01/31/21 1720 .  levothyroxine (SYNTHROID) tablet 100 mcg, 100 mcg, Oral, Q0600, Cherene Altes, MD, 100 mcg at 02/02/21 0736 .  metaxalone (SKELAXIN) tablet 400 mg, 400 mg, Oral, TID PRN, Cherene Altes, MD, 400 mg at 02/02/21 1024 .  multivitamin with minerals tablet 1 tablet, 1 tablet, Oral, Daily, Cherene Altes, MD, 1 tablet at 02/02/21 0935 .  naphazoline-glycerin (CLEAR EYES REDNESS) ophth solution 1-2 drop, 1-2 drop, Both Eyes, QID PRN, Cherene Altes, MD .  ondansetron (ZOFRAN) tablet 4 mg, 4 mg, Oral, Q6H PRN **OR** ondansetron (ZOFRAN) injection 4 mg, 4 mg, Intravenous, Q6H PRN, Judd Gaudier V, MD .  oxyCODONE (Oxy IR/ROXICODONE) immediate release tablet 5-10 mg, 5-10 mg, Oral, Q4H PRN, Cherene Altes, MD, 10 mg at 02/02/21 0946 .  polyethylene glycol (MIRALAX / GLYCOLAX) packet 17 g, 17 g, Oral, Daily, Florencia Reasons, MD, 17 g at 02/02/21 0935 .  senna-docusate (Senokot-S) tablet 1 tablet, 1 tablet, Oral, QHS, Florencia Reasons, MD, 1 tablet at 02/01/21 2211 .  sodium chloride flush (NS) 0.9 % injection 10-40 mL, 10-40 mL, Intracatheter, Q12H, Florencia Reasons, MD, 10 mL at 02/02/21 0936 .  sodium chloride flush (NS) 0.9 % injection  10-40 mL, 10-40 mL, Intracatheter, PRN, Florencia Reasons, MD .  tamsulosin [facility]) capsule 0.4 mg, 0.4 mg, Oral, QPC breakfast, Cherene Altes, MD, 0.4 mg at 02/02/21 0935 .  traMADol (ULTRAM) tablet 50 mg, 50 mg, Oral, Q6H PRN, Cherene Altes, MD, 50 mg at 02/01/21 1353  Patients Current Diet:  Diet Order            Diet regular Room service appropriate? Yes; Fluid consistency: Thin  Diet effective now                 Precautions / Restrictions Precautions Precautions: Fall,Back Precaution Booklet Issued: No Precaution Comments: verbally reviewed back precautions Spinal Brace: Lumbar corset,Applied in sitting position Restrictions Weight Bearing Restrictions: No   Has the patient had 2 or more falls or a fall with injury in the past year? No  Prior  Activity Level Community (5-7x/wk): independent prior to admit, retired, driving, no DME at baseline  Prior Functional Level Self Care: Did the patient need help bathing, dressing, using the toilet or eating? Independent  Indoor Mobility: Did the patient need assistance with walking from room to room (with or without device)? Independent  Stairs: Did the patient need assistance with internal or external stairs (with or without device)? Independent  Functional Cognition: Did the patient need help planning regular tasks such as shopping or remembering to take medications? Independent  Home Assistive Devices / Equipment Home Assistive Devices/Equipment: None Home Equipment: Wheelchair - manual  Prior Device Use: Indicate devices/aids used by the patient prior to current illness, exacerbation or injury? None of the above  Current Functional Level Cognition  Overall Cognitive Status: Within Functional Limits for tasks assessed Orientation Level: Oriented X4    Extremity Assessment (includes Sensation/Coordination)  Upper Extremity Assessment: Defer to OT evaluation  Lower Extremity Assessment: Generalized weakness (3-/5  grossly equal bil LEs per bed level assessment, sensation intact bil)    ADLs  Overall ADL's : Needs assistance/impaired Eating/Feeding: Set up,Bed level Eating/Feeding Details (indicate cue type and reason): vs supported sitting Grooming: Set up,Bed level Grooming Details (indicate cue type and reason): vs supported sitting Upper Body Bathing: Maximal assistance,Sitting Upper Body Bathing Details (indicate cue type and reason): Pt needing BUE support to static sit EOB. Either Max A in seated position or mod A at bed level to wash back Lower Body Bathing: Maximal assistance,+2 for physical assistance,Bed level Lower Body Bathing Details (indicate cue type and reason): struggled with lateral leans to either side. for pericare would need to complete bed level at this time. Upper Body Dressing : Moderate assistance,Maximal assistance,Sitting Upper Body Dressing Details (indicate cue type and reason): Able to briefly sit with single extremity support to reposition each breast during donning of back brace. Lower Body Dressing: Maximal assistance,+2 for physical assistance,Bed level General ADL Comments: Pt completed bed mobility, sat EOB with BUE support. Then completed 2/3 partial stands from elevated bed height. Jeral Fruit on third trial though pt too fatigued to clear hips at that point.    Mobility  Overal bed mobility: Needs Assistance Bed Mobility: Rolling,Sidelying to Sit,Sit to Sidelying Rolling: Mod assist Sidelying to sit: Mod assist Sit to sidelying: Mod assist General bed mobility comments: Heavy mod assist to log roll, come up to sitting from her side and return to sidelying from sitting.  Most assist needed at trunk to come up and at feel to return.  Pt heavily relying on bed rail for leverage.    Transfers  Overall transfer level: Needs assistance Equipment used: Rolling walker (2 wheeled) Transfer via Lift Equipment: Stedy Transfers: Sit to/from Stand Sit to Stand: Mod  assist,From elevated surface General transfer comment: Heavy mod assist to attempt to stand x 4 from elevated bed.  Pt has a difficult time fitting between the side bars of the steady.  Might be better to try without (two person assist, RW, block both knees).    Ambulation / Gait / Stairs / Wheelchair Mobility  Ambulation/Gait General Gait Details: unable at this time.    Posture / Balance Dynamic Sitting Balance Sitting balance - Comments: needs bil UE support in sitting, posterior lean Balance Overall balance assessment: Needs assistance Sitting-balance support: Feet supported,Bilateral upper extremity supported Sitting balance-Leahy Scale: Poor Sitting balance - Comments: needs bil UE support in sitting, posterior lean Standing balance support: Bilateral upper extremity supported Standing balance comment: pt  unable to achieve fully upright stand, most of her partial stands were flexed over the front of the steady standing frame.    Special needs/care consideration Continuous Drip IV  ancef (200 ml/hr q8 hrs) and Special service needs neuropsych for coping   Previous Home Environment (from acute therapy documentation) Living Arrangements: Other (Comment) (plans to go home with nephew for ahwile) Available Help at Discharge: Family,Available PRN/intermittently (nephew works fulltime) Type of Home: [place] Name: Home Gardens: Two level,Bed/bath upstairs Alternate Level Stairs-Rails: Right,Left (initially can reach both then then has to choose) Alternate Level Stairs-Number of Steps: full flight Home Access: Stairs to enter Entrance Stairs-Rails: None Entrance Stairs-Number of Steps: 2 Bathroom Shower/Tub: Multimedia programmer: [place]: No Additional Comments: unsure if she has if access to walker  Discharge Living Setting Plans for Discharge Living Setting: Lives with (comment) (nephew at discharge) Type of Home at Discharge:  House Discharge Home Layout: Two level,Full bath on main level,Bed/bath upstairs Alternate Level Stairs-Rails: Right,Left Alternate Level Stairs-Number of Steps: full flight Discharge Home Access: Stairs to enter Entrance Stairs-Rails: None Entrance Stairs-Number of Steps: 2 Discharge Bathroom Shower/Tub: Horticulturist, commercial: Standard Discharge Bathroom Accessibility: Yes How Accessible: Accessible via walker Does the patient have any problems obtaining your medications?: No  Social/Family/Support Systems Anticipated Caregiver: nephew, Darren Anticipated Ambulance person Information: [phone] Ability/Limitations of Caregiver: works during the day, pt can have other caregivers (friends) present throughout the day if she needs supervision/assist Caregiver Availability: Other (Comment) Discharge Plan Discussed with Primary Caregiver: Yes Is Caregiver In Agreement with Plan?: Yes Does Caregiver/Family have Issues with Lodging/Transportation while Pt is in Rehab?: No  Goals Patient/Family Goal for Rehab: PT/OT min assist, possibly w/c level Expected length of stay: 21-25 days Pt/Family Agrees to Admission and willing to participate: Yes Program Orientation Provided & Reviewed with Pt/Caregiver Including Roles  & Responsibilities: Yes  Barriers to Discharge: Insurance for SNF coverage,Home environment access/layout  Decrease burden of Care through IP rehab admission: n/a  Possible need for SNF placement upon discharge: not anticipated.  Pt has good support from nephew and her friends, who can be available to provide assist as needed.   Patient Condition: I have reviewed medical records [facility], spoken with CM, and patient. I met with patient at the bedside for inpatient rehabilitation assessment.  Patient will benefit from ongoing PT and OT, can actively participate in 3 hours of therapy a day 5 days of the week, and can make measurable gains during the  admission.  Patient will also benefit from the coordinated team approach during an Inpatient Acute Rehabilitation admission.  The patient will receive intensive therapy as well as Rehabilitation physician, nursing, social worker, and care management interventions.  Due to bladder management, bowel management, safety, skin/wound care, disease management, medication administration, pain management and patient education the patient requires 24 hour a day rehabilitation nursing.  The patient is currently max +2 with mobility and basic ADLs.  Discharge setting and therapy post discharge at home with home health is anticipated.  Patient has agreed to participate in the Acute Inpatient Rehabilitation Program and will admit pending insurance auth ***.  Preadmission Screen Completed By:  Michel Santee, PT, DPT 02/02/2021 1:33 PM ______________________________________________________________________   Discussed status with Dr. Marland Kitchen on *** at *** and received approval for admission today.  Admission Coordinator:  Michel Santee, PT, time Marland KitchenSudie Grumbling ***   Assessment/Plan: Diagnosis: 1. Does the need for close, 24  hr/day Medical supervision in concert with the patient's rehab needs make it unreasonable for this patient to be served in a less intensive setting? {yes_no_potentially:3041433} 2. Co-Morbidities requiring supervision/potential complications: *** 3. Due to {due DE:0814481}, does the patient require 24 hr/day rehab nursing? {yes_no_potentially:3041433} 4. Does the patient require coordinated care of a physician, rehab nurse, PT, OT, and SLP to address physical and functional deficits in the context of the above medical diagnosis(es)? {yes_no_potentially:3041433} Addressing deficits in the following areas: {deficits:3041436} 5. Can the patient actively participate in an intensive therapy program of at least 3 hrs of therapy 5 days a week? {yes_no_potentially:3041433} 6. The potential for patient to make  measurable gains while on inpatient rehab is {potential:3041437} 7. Anticipated functional outcomes upon discharge from inpatient rehab: {functional outcomes:304600100} PT, {functional outcomes:304600100} OT, {functional outcomes:304600100} SLP 8. Estimated rehab length of stay to reach the above functional goals is: *** 9. Anticipated discharge destination: {anticipated dc setting:21604} 10. Overall Rehab/Functional Prognosis: {potential:3041437}   MD Signature: ***

## 2021-02-02 NOTE — Progress Notes (Signed)
PROGRESS NOTE    Cassidy Austin  IEP:[id number] DOB: 08/07/1951 DOA: 01/27/2021 PCP: Laurann Montana, MD    Chief Complaint  Patient presents with  . Back Pain    Brief Narrative:  70yo with a hx of HTN., nephrolithiasis, and hypothyroidism who has been suffering with intractable low back pain since February 2022.  She required hospitalization in late February when she was found to be septic with pyelonephritis.  She was discharged to a rehab facility but but her low back pain has persisted despite the use of prescription medications.  An outpatient MRI of the lumbar spine 3/25 (obtained after outpt NS referral) revealed evidence concerning for lumbar discitis and osteomyelitis and the patient was therefore directed to the ER for admission.  3/27 CT guided aspiration of paraspinal area and disc at L1-L2  Subjective:  reports low back pain much improved, does has intermittent muscle spasm on lower back and pain radiating down to both legs yesterday, today pain radiating down to left leg   report continued weakness in lower extremities, but appears to continue showing slow improvement   Having bm  Assessment & Plan:   Principal Problem:   Discitis/osteomyelitis of lumbar region Active Problems:   Hypothyroidism   Hypertension   Intractable pain   Impaired ambulation   Protein-calorie malnutrition, severe   Discitis/osteomyelitis L1-L2 -Present with her intractable low back pain, not able to ambulate, she has a Ct lumber spine on 2/7 which showed degenerative changes, no acute findings, however due to persistent low back pain, MRI of the spine was obtained on March demonstrates which is concerned for discitis and osteomyelitis L1-L2 region -IR aspiration of the infected area on March 27, culture positive for pansensitive E. Coli -Blood culture negative -PICC line placed on 3/30 -was on rocephin, Currently on ancef, plan treat for 6-8 weeks from 3/27, appreciate ID  recommendations, f/u with ID on 4/14 at 11:30am   Nephrolithiasis -5 mm nonobstructive ureteric calculi & 3 mm right VUJcalculi that is probably obstructive noted on CT 12/11/20  -She was evaluated by urology Dr. Marlou Porch during last hospitalization recommended outpatient follow-up, patient report has not follow-up with urology yet -She denies flank pain, I have reviewed urology notes from last hospitalization with a plan of repeat renal ultrasound to follow-up on resolution of right-sided hydronephrosis, repeat renal ultrasound did not show hydronephrosis -Continue Flomax F/u with urology outpatient  Normocytic anemia No overt blood loss, report no BM for 2 weeks Retic count low, iron panel not consistent with iron deficiency, B12 unremarkable Anemia likely due to bone marrow suppression from ongoing infection Now start to have bm , will check FOBT   Hypertension Blood pressure stable on Coreg  Hypothyroidism TSH 2.088 ,continue Synthroid  Constipation Report no BM for 2 weeks Start MiraLAX and Senokot, resolved   Mild left common iliac lymphadenopathy, which is nonspecific findings on CT  From 2/7: Recommend continued follow-up by CT in 3 months.  Nutritional Assessment: The patient's BMI is: Body mass index is 40.71 kg/m.Marland Kitchen Meet class III obesity criteria Seen by dietician.  I agree with the assessment and plan as outlined below: Nutrition Status: Nutrition Problem: Severe Malnutrition Etiology: acute illness (lumbar discitis/osteomyelitis) Signs/Symptoms: percent weight loss,energy intake < or equal to 50% for > or equal to 5 days Interventions: Ensure Enlive (each supplement provides 350kcal and 20 grams of protein),MVI  .    Skin Assessment:  I have examined the patient's skin and I agree with the wound assessment as performed  by the wound care RN as outlined below:  Pressure Injury 12/10/20 Lumbar Lateral;Right Stage 2 -  Partial thickness loss of dermis presenting  as a shallow open injury with a red, pink wound bed without slough. skin peeled and redness (Active)  12/10/20 2000  Location: Lumbar  Location Orientation: Lateral;Right  Staging: Stage 2 -  Partial thickness loss of dermis presenting as a shallow open injury with a red, pink wound bed without slough.  Wound Description (Comments): skin peeled and redness  Present on Admission: No    Unresulted Labs (From admission, onward)         None        DVT prophylaxis: enoxaparin (LOVENOX) injection 40 mg Start: 02/01/21 2200 SCDs Start: 01/27/21 2026   Code Status: Full Family Communication: Patient Disposition:   Status is: Inpatient  Dispo: The patient is from: Skilled nursing facility              Anticipated d/c is to: CIR              Anticipated d/c date is: Awaiting insurance approval for CIR placement                Consultants:   Neurosurgery  CIR  Infectious disease  Procedures:   IR aspiration of the infected lumbar spine on March 27  PICC line placement on 3/30  Antimicrobials:   Anti-infectives (From admission, onward)   Start     Dose/Rate Route Frequency Ordered Stop   02/01/21 1400  ceFAZolin (ANCEF) IVPB 2g/100 mL premix        2 g 200 mL/hr over 30 Minutes Intravenous Every 8 hours 02/01/21 0910     01/29/21 2000  DAPTOmycin (CUBICIN) 750 mg in sodium chloride 0.9 % IVPB  Status:  Discontinued        750 mg 230 mL/hr over 30 Minutes Intravenous Daily 01/29/21 1521 01/30/21 1424   01/29/21 1700  cefTRIAXone (ROCEPHIN) 2 g in sodium chloride 0.9 % 100 mL IVPB  Status:  Discontinued        2 g 200 mL/hr over 30 Minutes Intravenous Every 24 hours 01/29/21 1521 02/01/21 0910          Objective: Vitals:   02/01/21 1712 02/01/21 2207 02/02/21 0903 02/02/21 1541  BP: 129/64 135/62 138/62 125/74  Pulse: 67 63 68 79  Resp: 20 18 17 17   Temp: 98.6 F (37 C) 97.8 F (36.6 C) 98.3 F (36.8 C) 98.6 F (37 C)  TempSrc: Oral Oral Oral Oral   SpO2: 98% 98% 100% 98%  Weight:      Height:        Intake/Output Summary (Last 24 hours) at 02/02/2021 1643 Last data filed at 02/02/2021 1500 Gross per 24 hour  Intake 605.25 ml  Output 1 ml  Net 604.25 ml   Filed Weights   01/27/21 2215  Weight: 114.4 kg    Examination:  General exam: calm, NAD Respiratory system: Clear to auscultation. Respiratory effort normal. Cardiovascular system: S1 & S2 heard, RRR.  Soft murmur 1-2/6 left upper sternal border, No pedal edema. Gastrointestinal system: Abdomen is nondistended, soft and nontender.  Normal bowel sounds heard. Central nervous system: Alert and oriented. No focal neurological deficits. Extremities: Able to move toes, not able to lift legs against gravity Skin: No rashes, lesions or ulcers Psychiatry: Judgement and insight appear normal. Mood & affect appropriate.     Data Reviewed: I have personally reviewed following labs and imaging studies  CBC: Recent Labs  Lab 01/27/21 1833 01/29/21 0840 01/30/21 0236 01/31/21 0505 02/01/21 0242  WBC 10.1 6.8 7.8 7.1 7.4  NEUTROABS 6.9  --   --   --  4.7  HGB 10.2* 9.4* 8.9* 8.9* 8.2*  HCT 32.4* 29.1* 28.0* 28.9* 26.0*  MCV 87.1 87.4 87.8 90.6 88.4  PLT 344 267 245 230 244    Basic Metabolic Panel: Recent Labs  Lab 01/27/21 1833 01/29/21 0840 01/30/21 0236 02/01/21 0242  NA 137 139 139 139  K 3.4* 3.6 3.7 3.7  CL 99 106 106 107  CO2 GLUCOSE 109* 124* 119* 107*  BUN CREATININE 0.88 0.83 0.67 0.74  CALCIUM 9.0 8.9 8.5* 8.5*  MG  --  2.0  --  1.9    GFR: Estimated Creatinine Clearance: 82.8 mL/min (by C-G formula based on SCr of 0.74 mg/dL).  Liver Function Tests: Recent Labs  Lab 01/27/21 1833 01/29/21 0840 01/30/21 0236  AST 18 12* 10*  ALT ALKPHOS 80 69 65  BILITOT 0.8 0.5 0.4  PROT 8.1 6.8 6.2*  ALBUMIN 3.1* 2.6* 2.4*    CBG: No results for input(s): GLUCAP in the last 168 hours.   Recent Results (from  the past 240 hour(s))  Culture, blood (routine x 2)     Status: None   Collection Time: 01/27/21  6:33 PM   Specimen: BLOOD  Result Value Ref Range Status   Specimen Description BLOOD LEFT ANTECUBITAL  Final   Special Requests   Final    BOTTLES DRAWN AEROBIC AND ANAEROBIC Blood Culture adequate volume   Culture   Final    NO GROWTH 5 DAYS Performed at [facility], [address] [place], [place] [address]    Report Status 02/01/2021 FINAL  Final  Resp Panel by RT-PCR (Flu A&B, Covid) Nasopharyngeal Swab     Status: None   Collection Time: 01/27/21  6:33 PM   Specimen: Nasopharyngeal Swab; Nasopharyngeal(NP) swabs in vial transport medium  Result Value Ref Range Status   SARS Coronavirus 2 by RT PCR NEGATIVE NEGATIVE Final    Comment: (NOTE) SARS-CoV-2 target nucleic acids are NOT DETECTED.  The SARS-CoV-2 RNA is generally detectable in upper respiratory specimens during the acute phase of infection. The lowest concentration of SARS-CoV-2 viral copies this assay can detect is 138 copies/mL. A negative result does not preclude SARS-Cov-2 infection and should not be used as the sole basis for treatment or other patient management decisions. A negative result may occur with  improper specimen collection/handling, submission of specimen other than nasopharyngeal swab, presence of viral mutation(s) within the areas targeted by this assay, and inadequate number of viral copies(<138 copies/mL). A negative result must be combined with clinical observations, patient history, and epidemiological information. The expected result is Negative.  Fact Sheet for Patients:  BloggerCourse.com  Fact Sheet for Healthcare Providers:  SeriousBroker.it  This test is no t yet approved or cleared by the [place] FDA and  has been authorized for detection and/or diagnosis of SARS-CoV-2 by FDA under an Emergency Use Authorization (EUA).  This EUA will remain  in effect (meaning this test can be used) for the duration of the COVID-19 declaration under Section 564(b)(1) of the Act, 21 U.S.C.section 360bbb-3(b)(1), unless the authorization is terminated  or revoked sooner.       Influenza A by PCR NEGATIVE NEGATIVE Final   Influenza B by PCR NEGATIVE NEGATIVE  Final    Comment: (NOTE) The Xpert Xpress SARS-CoV-2/FLU/RSV plus assay is intended as an aid in the diagnosis of influenza from Nasopharyngeal swab specimens and should not be used as a sole basis for treatment. Nasal washings and aspirates are unacceptable for Xpert Xpress SARS-CoV-2/FLU/RSV testing.  Fact Sheet for Patients: BloggerCourse.com  Fact Sheet for Healthcare Providers: SeriousBroker.it  This test is not yet approved or cleared by the [place] FDA and has been authorized for detection and/or diagnosis of SARS-CoV-2 by FDA under an Emergency Use Authorization (EUA). This EUA will remain in effect (meaning this test can be used) for the duration of the COVID-19 declaration under Section 564(b)(1) of the Act, 21 U.S.C. section 360bbb-3(b)(1), unless the authorization is terminated or revoked.  Performed at [facility], [address], [place], [place] [address]   Urine culture     Status: Abnormal   Collection Time: 01/27/21  6:33 PM   Specimen: Urine, Random  Result Value Ref Range Status   Specimen Description URINE, RANDOM  Final   Special Requests   Final    NONE Performed at [facility], [address] [place], [place] [address]    Culture MULTIPLE SPECIES PRESENT, SUGGEST RECOLLECTION (A)  Final   Report Status 01/29/2021 FINAL  Final  Aerobic/Anaerobic Culture (surgical/deep wound)     Status: None   Collection Time: 01/28/21 10:47 AM   Specimen: Fluid; Tissue  Result Value Ref Range Status   Specimen Description FLUID L1/L2 INTERVERTEBRAL DISC AND PARASPINAL   Final   Special Requests NONE  Final   Gram Stain   Final    MODERATE WBC PRESENT, PREDOMINANTLY PMN NO ORGANISMS SEEN    Culture   Final    RARE ESCHERICHIA COLI NO ANAEROBES ISOLATED Performed at [facility], [address], [place], [place] [address]    Report Status 02/02/2021 FINAL  Final   Organism ID, Bacteria ESCHERICHIA COLI  Final      Susceptibility   Escherichia coli - MIC*    AMPICILLIN 4 SENSITIVE Sensitive     CEFAZOLIN <=4 SENSITIVE Sensitive     CEFEPIME <=0.12 SENSITIVE Sensitive     CEFTAZIDIME <=1 SENSITIVE Sensitive     CEFTRIAXONE <=0.25 SENSITIVE Sensitive     CIPROFLOXACIN <=0.25 SENSITIVE Sensitive     GENTAMICIN <=1 SENSITIVE Sensitive     IMIPENEM <=0.25 SENSITIVE Sensitive     TRIMETH/SULFA <=20 SENSITIVE Sensitive     AMPICILLIN/SULBACTAM <=2 SENSITIVE Sensitive     PIP/TAZO <=4 SENSITIVE Sensitive     * RARE ESCHERICHIA COLI         Radiology Studies: US RENAL  Result Date: 01/31/2021 CLINICAL DATA:  Hydronephrosis. EXAM: RENAL / URINARY TRACT ULTRASOUND COMPLETE COMPARISON:  Included portion from disc aspiration 01/28/2021. Abdominal CT 12/11/2020, lumbar MRI 01/26/2021 FINDINGS: Right Kidney: Renal measurements: 11 x 4.2 x 5.1 cm = volume: 124 mL. Hydronephrosis on prior CT has resolved. There is mild increased renal parenchymal echogenicity. Hypoechoic 2.7 x 2.4 x 2.3 cm area in the superior kidney likely represent cyst when compared with prior MRI, but is not well assessed on the current exam due to habitus. Left Kidney: Renal measurements: 10.3 x 5.3 x 4.7 cm = volume: 133 mL. No hydronephrosis. There is a 2.2 cm parapelvic cyst in the lower kidney. No obvious solid lesion. 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 There is mild bladder wall thickening. Other: None. IMPRESSION: 1. No hydronephrosis. 2. Known  hypoechoic area in the superior right kidney likely represents a cyst, but is not well-defined on the current  exam. There is a parapelvic cyst in the lower left kidney. 3. 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 Electronically Signed   By: Narda RutherfordMelanie  Sanford M.D.   On: 01/31/2021 21:37        Scheduled Meds: . carvedilol  12.5 mg Oral BID  . Chlorhexidine Gluconate Cloth  6 each Topical Daily  . cholecalciferol  1,000 Units Oral BID  . enoxaparin (LOVENOX) injection  40 mg Subcutaneous Q24H  . ezetimibe  10 mg Oral Daily  . feeding supplement  237 mL Oral BID BM  . levothyroxine  100 mcg Oral Q0600  . multivitamin with minerals  1 tablet Oral Daily  . polyethylene glycol  17 g Oral Daily  . senna-docusate  1 tablet Oral QHS  . sodium chloride flush  10-40 mL Intracatheter Q12H  . tamsulosin  0.4 mg Oral QPC breakfast   Continuous Infusions: . sodium chloride 10 mL/hr at 01/30/21 1858  .  ceFAZolin (ANCEF) IV 2 g (02/02/21 1334)     LOS: 6 days   Time spent: 15mins Greater than 50% of this time was spent in counseling, explanation of diagnosis, planning of further management, and coordination of care.   Voice Recognition Reubin [place]/Dragon dictation system was used to create this note, attempts have been made to correct errors. Please contact the author with questions and/or clarifications.   Albertine GratesFang Allexis Bordenave, MD PhD FACP [facility]  Available via Epic secure chat 7am-7pm for nonurgent issues Please page for urgent issues To page the attending provider between 7A-7P or the covering provider during after hours 7P-7A, please log into the web site [email or web address] and access using universal [place] password for that web site. If you do not have the password, please call the hospital operator.    02/02/2021, 4:43 PM

## 2021-02-03 DIAGNOSIS — M4646 Discitis, unspecified, lumbar region: Secondary | ICD-10-CM | POA: Diagnosis not present

## 2021-02-03 NOTE — Progress Notes (Signed)
PROGRESS NOTE    Cassidy Austin  ZOX:[id number] DOB: 06/13/1951 DOA: 01/27/2021 PCP: Cassidy Austin    Chief Complaint  Patient presents with  . Back Pain    Brief Narrative:  70yo with a hx of HTN., nephrolithiasis, and hypothyroidism who has been suffering with intractable low back pain since February 2022.  She required hospitalization in late February when she was found to be septic with pyelonephritis.  She was discharged to a rehab facility but but her low back pain has persisted despite the use of prescription medications.  An outpatient MRI of the lumbar spine 3/25 (obtained after outpt NS referral) revealed evidence concerning for lumbar discitis and osteomyelitis and the patient was therefore directed to the ER for admission.  3/27 CT guided aspiration of paraspinal area and disc at L1-L2  Subjective:  reports low back pain much improved, less muscle spasm    report continued weakness in lower extremities, but appears to continue showing slow improvement   Having bm  Assessment & Plan:   Principal Problem:   Discitis/osteomyelitis of lumbar region Active Problems:   Hypothyroidism   Hypertension   Intractable pain   Impaired ambulation   Protein-calorie malnutrition, severe   Discitis/osteomyelitis L1-L2 -Present with her intractable low back pain, not able to ambulate, she has a Ct lumber spine on 2/7 which showed degenerative changes, no acute findings, however due to persistent low back pain, MRI of the spine was obtained on March demonstrates which is concerned for discitis and osteomyelitis L1-L2 region -IR aspiration of the infected area on March 27, culture positive for pansensitive E. Coli -Blood culture negative -PICC line placed on 3/30 -was on rocephin, Currently on ancef, plan treat for 6-8 weeks from 3/27, appreciate ID recommendations, f/u with ID on 4/14 at 11:30am   Nephrolithiasis -5 mm nonobstructive ureteric calculi & 3 mm right  VUJcalculi that is probably obstructive noted on CT 12/11/20  -She was evaluated by urology Dr. Marlou PorchHerrick during last hospitalization recommended outpatient follow-up, patient report has not follow-up with urology yet -She denies flank pain, I have reviewed urology notes from last hospitalization with a plan of repeat renal ultrasound to follow-up on resolution of right-sided hydronephrosis, repeat renal ultrasound did not show hydronephrosis -Continue Flomax F/u with urology outpatient  Normocytic anemia No overt blood loss, report no BM for 2 weeks Retic count low, iron panel not consistent with iron deficiency, B12 unremarkable Anemia likely due to bone marrow suppression from ongoing infection Now start to have bm , will check FOBT   Hypertension Blood pressure stable on Coreg  Hypothyroidism TSH 2.088 ,continue Synthroid  Constipation Report no BM for 2 weeks Start MiraLAX and Senokot, resolved   Mild left common iliac lymphadenopathy, which is nonspecific findings on CT  From 2/7: Recommend continued follow-up by CT in 3 months.  Nutritional Assessment: The patient's BMI is: Body mass index is 40.71 kg/m.Marland Kitchen. Meet class III obesity criteria Seen by dietician.  I agree with the assessment and plan as outlined below: Nutrition Status: Nutrition Problem: Severe Malnutrition Etiology: acute illness (lumbar discitis/osteomyelitis) Signs/Symptoms: percent weight loss,energy intake < or equal to 50% for > or equal to 5 days Interventions: Ensure Enlive (each supplement provides 350kcal and 20 grams of protein),MVI  .    Skin Assessment:  I have examined the patient's skin and I agree with the wound assessment as performed by the wound care RN as outlined below:  Pressure Injury 12/10/20 Lumbar Lateral;Right Stage 2 -  Partial  thickness loss of dermis presenting as a shallow open injury with a red, pink wound bed without slough. skin peeled and redness (Active)  12/10/20 2000   Location: Lumbar  Location Orientation: Lateral;Right  Staging: Stage 2 -  Partial thickness loss of dermis presenting as a shallow open injury with a red, pink wound bed without slough.  Wound Description (Comments): skin peeled and redness  Present on Admission: No    Unresulted Labs (From admission, onward)          Start     Ordered   02/04/21 0500  CBC with Differential/Platelet  Tomorrow morning,   R       Question:  Specimen collection method  Answer:  IV Team=IV Team collect   02/03/21 1603   02/04/21 0500  Basic metabolic panel  Tomorrow morning,   R       Question:  Specimen collection method  Answer:  IV Team=IV Team collect   02/03/21 1603   02/04/21 0500  Magnesium  Tomorrow morning,   R       Question:  Specimen collection method  Answer:  IV Team=IV Team collect   02/03/21 1603   Unscheduled  Occult blood card to lab, stool  As needed,   R      02/02/21 1646            DVT prophylaxis: enoxaparin (LOVENOX) injection 40 mg Start: 02/01/21 2200 SCDs Start: 01/27/21 2026   Code Status: Full Family Communication: Patient Disposition:   Status is: Inpatient  Dispo: The patient is from: Skilled nursing facility              Anticipated d/c is to: CIR              Anticipated d/c date is: Awaiting insurance approval for CIR placement                Consultants:   Neurosurgery  CIR  Infectious disease  Procedures:   IR aspiration of the infected lumbar spine on March 27  PICC line placement on 3/30  Antimicrobials:   Anti-infectives (From admission, onward)   Start     Dose/Rate Route Frequency Ordered Stop   02/01/21 1400  ceFAZolin (ANCEF) IVPB 2g/100 mL premix        2 g 200 mL/hr over 30 Minutes Intravenous Every 8 hours 02/01/21 0910     01/29/21 2000  DAPTOmycin (CUBICIN) 750 mg in sodium chloride 0.9 % IVPB  Status:  Discontinued        750 mg 230 mL/hr over 30 Minutes Intravenous Daily 01/29/21 1521 01/30/21 1424   01/29/21 1700   cefTRIAXone (ROCEPHIN) 2 g in sodium chloride 0.9 % 100 mL IVPB  Status:  Discontinued        2 g 200 mL/hr over 30 Minutes Intravenous Every 24 hours 01/29/21 1521 02/01/21 0910          Objective: Vitals:   02/02/21 2100 02/03/21 0433 02/03/21 0807 02/03/21 1420  BP: 120/75 133/63 (!) 144/64 (!) 150/60  Pulse: 76 76 61 63  Resp: 16 16 17 17   Temp: 98.8 F (37.1 C) 98.7 F (37.1 C) 97.7 F (36.5 C) 98.6 F (37 C)  TempSrc: Oral Oral Oral Oral  SpO2: 100% 99% 99% 100%  Weight:      Height:        Intake/Output Summary (Last 24 hours) at 02/03/2021 1603 Last data filed at 02/03/2021 1500 Gross per 24 hour  Intake --  Output 2051 ml  Net -2051 ml   Filed Weights   01/27/21 2215  Weight: 114.4 kg    Examination:  General exam: calm, NAD Respiratory system: Clear to auscultation. Respiratory effort normal. Cardiovascular system: S1 & S2 heard, RRR.  Soft murmur 1-2/6 left upper sternal border, No pedal edema. Gastrointestinal system: Abdomen is nondistended, soft and nontender.  Normal bowel sounds heard. Central nervous system: Alert and oriented. No focal neurological deficits. Extremities: Able to move toes, not able to lift legs against gravity Skin: No rashes, lesions or ulcers Psychiatry: Judgement and insight appear normal. Mood & affect appropriate.     Data Reviewed: I have personally reviewed following labs and imaging studies  CBC: Recent Labs  Lab 01/27/21 1833 01/29/21 0840 01/30/21 0236 01/31/21 0505 02/01/21 0242  WBC 10.1 6.8 7.8 7.1 7.4  NEUTROABS 6.9  --   --   --  4.7  HGB 10.2* 9.4* 8.9* 8.9* 8.2*  HCT 32.4* 29.1* 28.0* 28.9* 26.0*  MCV 87.1 87.4 87.8 90.6 88.4  PLT 344 267 245 230 244    Basic Metabolic Panel: Recent Labs  Lab 01/27/21 1833 01/29/21 0840 01/30/21 0236 02/01/21 0242  NA 137 139 139 139  K 3.4* 3.6 3.7 3.7  CL 99 106 106 107  CO2 30 28 25 27   GLUCOSE 109* 124* 119* 107*  BUN 14 10 9 11   CREATININE 0.88 0.83  0.67 0.74  CALCIUM 9.0 8.9 8.5* 8.5*  MG  --  2.0  --  1.9    GFR: Estimated Creatinine Clearance: 82.8 mL/min (by C-G formula based on SCr of 0.74 mg/dL).  Liver Function Tests: Recent Labs  Lab 01/27/21 1833 01/29/21 0840 01/30/21 0236  AST 18 12* 10*  ALT 10 10 10   ALKPHOS 80 69 65  BILITOT 0.8 0.5 0.4  PROT 8.1 6.8 6.2*  ALBUMIN 3.1* 2.6* 2.4*    CBG: No results for input(s): GLUCAP in the last 168 hours.   Recent Results (from the past 240 hour(s))  Culture, blood (routine x 2)     Status: None   Collection Time: 01/27/21  6:33 PM   Specimen: BLOOD  Result Value Ref Range Status   Specimen Description BLOOD LEFT ANTECUBITAL  Final   Special Requests   Final    BOTTLES DRAWN AEROBIC AND ANAEROBIC Blood Culture adequate volume   Culture   Final    NO GROWTH 5 DAYS Performed at [facility], [address] [place], [facility] [address]    Report Status 02/01/2021 FINAL  Final  Resp Panel by RT-PCR (Flu A&B, Covid) Nasopharyngeal Swab     Status: None   Collection Time: 01/27/21  6:33 PM   Specimen: Nasopharyngeal Swab; Nasopharyngeal(NP) swabs in vial transport medium  Result Value Ref Range Status   SARS Coronavirus 2 by RT PCR NEGATIVE NEGATIVE Final    Comment: (NOTE) SARS-CoV-2 target nucleic acids are NOT DETECTED.  The SARS-CoV-2 RNA is generally detectable in upper respiratory specimens during the acute phase of infection. The lowest concentration of SARS-CoV-2 viral copies this assay can detect is 138 copies/mL. A negative result does not preclude SARS-Cov-2 infection and should not be used as the sole basis for treatment or other patient management decisions. A negative result may occur with  improper specimen collection/handling, submission of specimen other than nasopharyngeal swab, presence of viral mutation(s) within the areas targeted by this assay, and inadequate number of viral copies(<138 copies/mL). A negative result must be combined  with clinical observations, patient history, and epidemiological information. The expected result is Negative.  Fact Sheet for Patients:  BloggerCourse.com  Fact Sheet for Healthcare Providers:  SeriousBroker.it  This test is no t yet approved or cleared by the [place] FDA and  has been authorized for detection and/or diagnosis of SARS-CoV-2 by FDA under an Emergency Use Authorization (EUA). This EUA will remain  in effect (meaning this test can be used) for the duration of the COVID-19 declaration under Section 564(b)(1) of the Act, 21 U.S.C.section 360bbb-3(b)(1), unless the authorization is terminated  or revoked sooner.       Influenza A by PCR NEGATIVE NEGATIVE Final   Influenza B by PCR NEGATIVE NEGATIVE Final    Comment: (NOTE) The Xpert Xpress SARS-CoV-2/FLU/RSV plus assay is intended as an aid in the diagnosis of influenza from Nasopharyngeal swab specimens and should not be used as a sole basis for treatment. Nasal washings and aspirates are unacceptable for Xpert Xpress SARS-CoV-2/FLU/RSV testing.  Fact Sheet for Patients: BloggerCourse.com  Fact Sheet for Healthcare Providers: SeriousBroker.it  This test is not yet approved or cleared by the [place] FDA and has been authorized for detection and/or diagnosis of SARS-CoV-2 by FDA under an Emergency Use Authorization (EUA). This EUA will remain in effect (meaning this test can be used) for the duration of the COVID-19 declaration under Section 564(b)(1) of the Act, 21 U.S.C. section 360bbb-3(b)(1), unless the authorization is terminated or revoked.  Performed at [facility], [address] [place], [place] [address]   Urine culture     Status: Abnormal   Collection Time: 01/27/21  6:33 PM   Specimen: Urine, Random  Result Value Ref Range Status   Specimen Description URINE, RANDOM  Final    Special Requests   Final    NONE Performed at [facility], [address] [place], [place] [address]    Culture MULTIPLE SPECIES PRESENT, SUGGEST RECOLLECTION (A)  Final   Report Status 01/29/2021 FINAL  Final  Aerobic/Anaerobic Culture (surgical/deep wound)     Status: None   Collection Time: 01/28/21 10:47 AM   Specimen: Fluid; Tissue  Result Value Ref Range Status   Specimen Description FLUID L1/L2 INTERVERTEBRAL DISC AND PARASPINAL  Final   Special Requests NONE  Final   Gram Stain   Final    MODERATE WBC PRESENT, PREDOMINANTLY PMN NO ORGANISMS SEEN    Culture   Final    RARE ESCHERICHIA COLI NO ANAEROBES ISOLATED Performed at [facility], [address], [place], [place] [address]    Report Status 02/02/2021 FINAL  Final   Organism ID, Bacteria ESCHERICHIA COLI  Final      Susceptibility   Escherichia coli - MIC*    AMPICILLIN 4 SENSITIVE Sensitive     CEFAZOLIN <=4 SENSITIVE Sensitive     CEFEPIME <=0.12 SENSITIVE Sensitive     CEFTAZIDIME <=1 SENSITIVE Sensitive     CEFTRIAXONE <=0.25 SENSITIVE Sensitive     CIPROFLOXACIN <=0.25 SENSITIVE Sensitive     GENTAMICIN <=1 SENSITIVE Sensitive     IMIPENEM <=0.25 SENSITIVE Sensitive     TRIMETH/SULFA <=20 SENSITIVE Sensitive     AMPICILLIN/SULBACTAM <=2 SENSITIVE Sensitive     PIP/TAZO <=4 SENSITIVE Sensitive     * RARE ESCHERICHIA COLI         Radiology Studies: No results found.      Scheduled Meds: . carvedilol  12.5 mg Oral BID  . Chlorhexidine Gluconate Cloth  6  each Topical Daily  . cholecalciferol  1,000 Units Oral BID  . enoxaparin (LOVENOX) injection  40 mg Subcutaneous Q24H  . ezetimibe  10 mg Oral Daily  . feeding supplement  237 mL Oral BID BM  . levothyroxine  100 mcg Oral Q0600  . multivitamin with minerals  1 tablet Oral Daily  . polyethylene glycol  17 g Oral Daily  . senna-docusate  1 tablet Oral QHS  . sodium chloride flush  10-40 mL Intracatheter Q12H  . tamsulosin  0.4  mg Oral QPC breakfast   Continuous Infusions: .  ceFAZolin (ANCEF) IV 2 g (02/03/21 0606)     LOS: 7 days   Time spent: Greater than 50% of this time was spent in counseling, explanation of diagnosis, planning of further management, and coordination of care.   Voice Recognition Reubin [place] dictation system was used to create this note, attempts have been made to correct errors. Please contact the author with questions and/or clarifications.   Albertine Grates, MD PhD FACP [facility]  Available via Epic secure chat 7am-7pm for nonurgent issues Please page for urgent issues To page the attending provider between 7A-7P or the covering provider during after hours 7P-7A, please log into the web site [email or web address] and access using universal [place] password for that web site. If you do not have the password, please call the hospital operator.    02/03/2021, 4:03 PM

## 2021-02-04 DIAGNOSIS — M4646 Discitis, unspecified, lumbar region: Secondary | ICD-10-CM | POA: Diagnosis not present

## 2021-02-04 LAB — CBC WITH DIFFERENTIAL/PLATELET
Abs Immature Granulocytes: 0.02 10*3/uL (ref 0.00–0.07)
Basophils Absolute: 0.1 10*3/uL (ref 0.0–0.1)
Basophils Relative: 1 %
Eosinophils Absolute: 0.7 10*3/uL — ABNORMAL HIGH (ref 0.0–0.5)
Eosinophils Relative: 10 %
HCT: 26 % — ABNORMAL LOW (ref 36.0–46.0)
Hemoglobin: 8.2 g/dL — ABNORMAL LOW (ref 12.0–15.0)
Immature Granulocytes: 0 %
Lymphocytes Relative: 23 %
Lymphs Abs: 1.7 10*3/uL (ref 0.7–4.0)
MCH: 27.7 pg (ref 26.0–34.0)
MCHC: 31.5 g/dL (ref 30.0–36.0)
MCV: 87.8 fL (ref 80.0–100.0)
Monocytes Absolute: 0.7 10*3/uL (ref 0.1–1.0)
Monocytes Relative: 9 %
Neutro Abs: 4.2 10*3/uL (ref 1.7–7.7)
Neutrophils Relative %: 57 %
Platelets: 259 10*3/uL (ref 150–400)
RBC: 2.96 MIL/uL — ABNORMAL LOW (ref 3.87–5.11)
RDW: 16.7 % — ABNORMAL HIGH (ref 11.5–15.5)
WBC: 7.3 10*3/uL (ref 4.0–10.5)
nRBC: 0 % (ref 0.0–0.2)

## 2021-02-04 LAB — BASIC METABOLIC PANEL
Anion gap: 4 — ABNORMAL LOW (ref 5–15)
BUN: 7 mg/dL — ABNORMAL LOW (ref 8–23)
CO2: 30 mmol/L (ref 22–32)
Calcium: 8.6 mg/dL — ABNORMAL LOW (ref 8.9–10.3)
Chloride: 106 mmol/L (ref 98–111)
Creatinine, Ser: 0.72 mg/dL (ref 0.44–1.00)
GFR, Estimated: >60 mL/min (ref >60)
Glucose, Bld: 103 mg/dL — ABNORMAL HIGH (ref 70–99)
Potassium: 3.6 mmol/L (ref 3.5–5.1)
Sodium: 140 mmol/L (ref 135–145)

## 2021-02-04 LAB — MAGNESIUM: Magnesium: 2 mg/dL (ref 1.7–2.4)

## 2021-02-04 LAB — OCCULT BLOOD X 1 CARD TO LAB, STOOL: Fecal Occult Bld: NEGATIVE

## 2021-02-04 NOTE — Progress Notes (Signed)
PROGRESS NOTE    Cassidy DavidsonBeverly Austin  GNF:[id number] DOB: 09-Dec-1950 DOA: 01/27/2021 PCP: Laurann MontanaWhite, Cynthia, MD    Chief Complaint  Patient presents with  . Back Pain    Brief Narrative:  70yo with a hx of HTN., nephrolithiasis, and hypothyroidism who has been suffering with intractable low back pain since February 2022.  She required hospitalization in late February when she was found to be septic with pyelonephritis.  She was discharged to a rehab facility but but her low back pain has persisted despite the use of prescription medications.  An outpatient MRI of the lumbar spine 3/25 (obtained after outpt NS referral) revealed evidence concerning for lumbar discitis and osteomyelitis and the patient was therefore directed to the ER for admission.  3/27 CT guided aspiration of paraspinal area and disc at L1-L2  Subjective:  Low back pain and muscle spasms is the same compare to yesterday,     report continued weakness in lower extremities, but appears to continue showing slow improvement   Having bm She does not want the ensure  Assessment & Plan:   Principal Problem:   Discitis/osteomyelitis of lumbar region Active Problems:   Hypothyroidism   Hypertension   Intractable pain   Impaired ambulation   Protein-calorie malnutrition, severe   Discitis/osteomyelitis L1-L2 -Present with her intractable low back pain, not able to ambulate, she has a Ct lumber spine on 2/7 which showed degenerative changes, no acute findings, however due to persistent low back pain, MRI of the spine was obtained on March demonstrates which is concerned for discitis and osteomyelitis L1-L2 region -IR aspiration of the infected area on March 27, culture positive for pansensitive E. Coli -Blood culture negative -PICC line placed on 3/30 -was on rocephin, Currently on ancef, plan treat for 6-8 weeks from 3/27, appreciate ID recommendations, f/u with ID on 4/14 at 11:30am   Nephrolithiasis -5 mm  nonobstructive ureteric calculi & 3 mm right VUJcalculi that is probably obstructive noted on CT 12/11/20  -She was evaluated by urology Dr. Marlou PorchHerrick during last hospitalization recommended outpatient follow-up, patient report has not follow-up with urology yet -She denies flank pain, I have reviewed urology notes from last hospitalization with a plan of repeat renal ultrasound to follow-up on resolution of right-sided hydronephrosis, repeat renal ultrasound did not show hydronephrosis -Continue Flomax F/u with urology outpatient  Normocytic anemia No overt blood loss, report no BM for 2 weeks Retic count low, iron panel not consistent with iron deficiency, B12 unremarkable Anemia likely due to bone marrow suppression from ongoing infection Now start to have bm , will check FOBT, RN notified to collect   Hypertension Blood pressure stable on Coreg  Hypothyroidism TSH 2.088 ,continue Synthroid  Constipation Report no BM for 2 weeks Resolved with MiraLAX and Senokot   Mild left common iliac lymphadenopathy, which is nonspecific findings on CT  From 2/7: Recommend continued follow-up by CT in 3 months.  Nutritional Assessment: The patient's BMI is: Body mass index is 40.71 kg/m.Marland Kitchen. Meet class III obesity criteria Seen by dietician.  I agree with the assessment and plan as outlined below: Nutrition Status: Nutrition Problem: Severe Malnutrition Etiology: acute illness (lumbar discitis/osteomyelitis) Signs/Symptoms: percent weight loss,energy intake < or equal to 50% for > or equal to 5 days Interventions: Ensure Enlive (each supplement provides 350kcal and 20 grams of protein),MVI  .    Skin Assessment:  I have examined the patient's skin and I agree with the wound assessment as performed by the wound care RN  as outlined below:  Pressure Injury 12/10/20 Lumbar Lateral;Right Stage 2 -  Partial thickness loss of dermis presenting as a shallow open injury with a red, pink wound bed  without slough. skin peeled and redness (Active)  12/10/20 2000  Location: Lumbar  Location Orientation: Lateral;Right  Staging: Stage 2 -  Partial thickness loss of dermis presenting as a shallow open injury with a red, pink wound bed without slough.  Wound Description (Comments): skin peeled and redness  Present on Admission: No    Unresulted Labs (From admission, onward)          Start     Ordered   Unscheduled  Occult blood card to lab, stool  As needed,   R      02/02/21 1646            DVT prophylaxis: enoxaparin (LOVENOX) injection 40 mg Start: 02/01/21 2200 SCDs Start: 01/27/21 2026   Code Status: Full Family Communication: Patient Disposition:   Status is: Inpatient  Dispo: The patient is from: Skilled nursing facility              Anticipated d/c is to: CIR              Anticipated d/c date is: Awaiting insurance approval for CIR placement                Consultants:   Neurosurgery  CIR  Infectious disease  Procedures:   IR aspiration of the infected lumbar spine on March 27  PICC line placement on 3/30  Antimicrobials:   Anti-infectives (From admission, onward)   Start     Dose/Rate Route Frequency Ordered Stop   02/01/21 1400  ceFAZolin (ANCEF) IVPB 2g/100 mL premix        2 g 200 mL/hr over 30 Minutes Intravenous Every 8 hours 02/01/21 0910     01/29/21 2000  DAPTOmycin (CUBICIN) 750 mg in sodium chloride 0.9 % IVPB  Status:  Discontinued        750 mg 230 mL/hr over 30 Minutes Intravenous Daily 01/29/21 1521 01/30/21 1424   01/29/21 1700  cefTRIAXone (ROCEPHIN) 2 g in sodium chloride 0.9 % 100 mL IVPB  Status:  Discontinued        2 g 200 mL/hr over 30 Minutes Intravenous Every 24 hours 01/29/21 1521 02/01/21 0910          Objective: Vitals:   02/03/21 1420 02/03/21 2150 02/04/21 0338 02/04/21 0823  BP: (!) 150/60 (!) 142/66 140/70 (!) 159/77  Pulse: 63 69 66 65  Resp: 17 18 17 17   Temp: 98.6 F (37 C) 99.1 F (37.3 C) 98.7  F (37.1 C) 98.9 F (37.2 C)  TempSrc: Oral Oral Oral Oral  SpO2: 100% 98% 100% 98%  Weight:      Height:        Intake/Output Summary (Last 24 hours) at 02/04/2021 1105 Last data filed at 02/03/2021 1500 Gross per 24 hour  Intake --  Output 600 ml  Net -600 ml   Filed Weights   01/27/21 2215  Weight: 114.4 kg    Examination:  General exam: calm, NAD Respiratory system: Clear to auscultation. Respiratory effort normal. Cardiovascular system: S1 & S2 heard, RRR.  Soft murmur 1-2/6 left upper sternal border, No pedal edema. Gastrointestinal system: Abdomen is nondistended, soft and nontender.  Normal bowel sounds heard. Central nervous system: Alert and oriented. No focal neurological deficits. Extremities: Able to move toes, not able to lift legs against gravity Skin:  No rashes, lesions or ulcers Psychiatry: Judgement and insight appear normal. Mood & affect appropriate.     Data Reviewed: I have personally reviewed following labs and imaging studies  CBC: Recent Labs  Lab 01/29/21 0840 01/30/21 0236 01/31/21 0505 02/01/21 0242 02/04/21 0334  WBC 6.8 7.8 7.1 7.4 7.3  NEUTROABS  --   --   --  4.7 4.2  HGB 9.4* 8.9* 8.9* 8.2* 8.2*  HCT 29.1* 28.0* 28.9* 26.0* 26.0*  MCV 87.4 87.8 90.6 88.4 87.8  PLT 267 245 230 244 259    Basic Metabolic Panel: Recent Labs  Lab 01/29/21 0840 01/30/21 0236 02/01/21 0242 02/04/21 0334  NA 139 139 139 140  K 3.6 3.7 3.7 3.6  CL 106 106 107 106  CO2 GLUCOSE 124* 119* 107* 103*  BUN 7*  CREATININE 0.83 0.67 0.74 0.72  CALCIUM 8.9 8.5* 8.5* 8.6*  MG 2.0  --  1.9 2.0    GFR: Estimated Creatinine Clearance: 82.8 mL/min (by C-G formula based on SCr of 0.72 mg/dL).  Liver Function Tests: Recent Labs  Lab 01/29/21 0840 01/30/21 0236  AST 12* 10*  ALT 10 10  ALKPHOS 69 65  BILITOT 0.5 0.4  PROT 6.8 6.2*  ALBUMIN 2.6* 2.4*    CBG: No results for input(s): GLUCAP in the last 168 hours.   Recent  Results (from the past 240 hour(s))  Culture, blood (routine x 2)     Status: None   Collection Time: 01/27/21  6:33 PM   Specimen: BLOOD  Result Value Ref Range Status   Specimen Description BLOOD LEFT ANTECUBITAL  Final   Special Requests   Final    BOTTLES DRAWN AEROBIC AND ANAEROBIC Blood Culture adequate volume   Culture   Final    NO GROWTH 5 DAYS Performed at [facility], [address], [place], [place] [address]    Report Status 02/01/2021 FINAL  Final  Resp Panel by RT-PCR (Flu A&B, Covid) Nasopharyngeal Swab     Status: None   Collection Time: 01/27/21  6:33 PM   Specimen: Nasopharyngeal Swab; Nasopharyngeal(NP) swabs in vial transport medium  Result Value Ref Range Status   SARS Coronavirus 2 by RT PCR NEGATIVE NEGATIVE Final    Comment: (NOTE) SARS-CoV-2 target nucleic acids are NOT DETECTED.  The SARS-CoV-2 RNA is generally detectable in upper respiratory specimens during the acute phase of infection. The lowest concentration of SARS-CoV-2 viral copies this assay can detect is 138 copies/mL. A negative result does not preclude SARS-Cov-2 infection and should not be used as the sole basis for treatment or other patient management decisions. A negative result may occur with  improper specimen collection/handling, submission of specimen other than nasopharyngeal swab, presence of viral mutation(s) within the areas targeted by this assay, and inadequate number of viral copies(<138 copies/mL). A negative result must be combined with clinical observations, patient history, and epidemiological information. The expected result is Negative.  Fact Sheet for Patients:  BloggerCourse.com  Fact Sheet for Healthcare Providers:  SeriousBroker.it  This test is no t yet approved or cleared by the [place] FDA and  has been authorized for detection and/or diagnosis of SARS-CoV-2 by FDA under an Emergency Use  Authorization (EUA). This EUA will remain  in effect (meaning this test can be used) for the duration of the COVID-19 declaration under Section 564(b)(1) of the Act, 21 U.S.C.section 360bbb-3(b)(1), unless the authorization is terminated  or revoked sooner.  Influenza A by PCR NEGATIVE NEGATIVE Final   Influenza B by PCR NEGATIVE NEGATIVE Final    Comment: (NOTE) The Xpert Xpress SARS-CoV-2/FLU/RSV plus assay is intended as an aid in the diagnosis of influenza from Nasopharyngeal swab specimens and should not be used as a sole basis for treatment. Nasal washings and aspirates are unacceptable for Xpert Xpress SARS-CoV-2/FLU/RSV testing.  Fact Sheet for Patients: BloggerCourse.com  Fact Sheet for Healthcare Providers: SeriousBroker.it  This test is not yet approved or cleared by the [place] FDA and has been authorized for detection and/or diagnosis of SARS-CoV-2 by FDA under an Emergency Use Authorization (EUA). This EUA will remain in effect (meaning this test can be used) for the duration of the COVID-19 declaration under Section 564(b)(1) of the Act, 21 U.S.C. section 360bbb-3(b)(1), unless the authorization is terminated or revoked.  Performed at [facility], [address] [place], [place] [address]   Urine culture     Status: Abnormal   Collection Time: 01/27/21  6:33 PM   Specimen: Urine, Random  Result Value Ref Range Status   Specimen Description URINE, RANDOM  Final   Special Requests   Final    NONE Performed at [facility], [address] [place], [place] [address]    Culture MULTIPLE SPECIES PRESENT, SUGGEST RECOLLECTION (A)  Final   Report Status 01/29/2021 FINAL  Final  Aerobic/Anaerobic Culture (surgical/deep wound)     Status: None   Collection Time: 01/28/21 10:47 AM   Specimen: Fluid; Tissue  Result Value Ref Range Status   Specimen Description FLUID L1/L2 INTERVERTEBRAL DISC  AND PARASPINAL  Final   Special Requests NONE  Final   Gram Stain   Final    MODERATE WBC PRESENT, PREDOMINANTLY PMN NO ORGANISMS SEEN    Culture   Final    RARE ESCHERICHIA COLI NO ANAEROBES ISOLATED Performed at [facility], [address] [place], [place] [address]    Report Status 02/02/2021 FINAL  Final   Organism ID, Bacteria ESCHERICHIA COLI  Final      Susceptibility   Escherichia coli - MIC*    AMPICILLIN 4 SENSITIVE Sensitive     CEFAZOLIN <=4 SENSITIVE Sensitive     CEFEPIME <=0.12 SENSITIVE Sensitive     CEFTAZIDIME <=1 SENSITIVE Sensitive     CEFTRIAXONE <=0.25 SENSITIVE Sensitive     CIPROFLOXACIN <=0.25 SENSITIVE Sensitive     GENTAMICIN <=1 SENSITIVE Sensitive     IMIPENEM <=0.25 SENSITIVE Sensitive     TRIMETH/SULFA <=20 SENSITIVE Sensitive     AMPICILLIN/SULBACTAM <=2 SENSITIVE Sensitive     PIP/TAZO <=4 SENSITIVE Sensitive     * RARE ESCHERICHIA COLI         Radiology Studies: No results found.      Scheduled Meds: . carvedilol  12.5 mg Oral BID  . Chlorhexidine Gluconate Cloth  6 each Topical Daily  . cholecalciferol  1,000 Units Oral BID  . enoxaparin (LOVENOX) injection  40 mg Subcutaneous Q24H  . ezetimibe  10 mg Oral Daily  . levothyroxine  100 mcg Oral Q0600  . multivitamin with minerals  1 tablet Oral Daily  . sodium chloride flush  10-40 mL Intracatheter Q12H  . tamsulosin  0.4 mg Oral QPC breakfast   Continuous Infusions: .  ceFAZolin (ANCEF) IV 2 g (02/04/21 0552)     LOS: 8 days   Time spent: Greater than 50% of this time was spent in counseling, explanation of diagnosis, planning of  further management, and coordination of care.   Voice Recognition Reubin Milan dictation system was used to create this note, attempts have been made to correct errors. Please contact the author with questions and/or clarifications.   Albertine Grates, MD PhD FACP [facility]  Available via Epic secure chat 7am-7pm for nonurgent  issues Please page for urgent issues To page the attending provider between 7A-7P or the covering provider during after hours 7P-7A, please log into the web site [email or web address] and access using universal [place] password for that web site. If you do not have the password, please call the hospital operator.    02/04/2021, 11:05 AM

## 2021-02-05 DIAGNOSIS — M4646 Discitis, unspecified, lumbar region: Secondary | ICD-10-CM | POA: Diagnosis not present

## 2021-02-05 NOTE — Progress Notes (Addendum)
Subjective: Patient reports that she continues to have an improvement in her symptoms and is pleased with her status.   Objective: Vital signs in last 24 hours: Temp:  [98.4 F (36.9 C)-99.2 F (37.3 C)] 99 F (37.2 C) (04/04 0831) Pulse Rate:  [64-71] 64 (04/04 0831) Resp:  [17-18] 17 (04/04 0831) BP: (127-169)/(66-80) 169/74 (04/04 0831) SpO2:  [97 %-100 %] 100 % (04/04 0831)  Intake/Output from previous day: No intake/output data recorded. Intake/Output this shift: No intake/output data recorded.  Physical Exam: Patient is awake, A/O X 4, conversant, and inNAD. Speech is fluent and appropriate. MAEW with good strength that is symmetric bilaterally. 5/5 BUE/BLE.  Lab Results: Recent Labs    02/04/21 0334  WBC 7.3  HGB 8.2*  HCT 26.0*  PLT 259   BMET Recent Labs    02/04/21 0334  NA 140  K 3.6  CL 106  CO2 30  GLUCOSE 103*  BUN 7*  CREATININE 0.72  CALCIUM 8.6*    Studies/Results: No results found.  Assessment/Plan: 70 year old female withdiscitis/osteomyelitisat L2.Initially, she had c/o severe and intractable low back pain that radiated into her bilateral buttocks and into her bilateral posterior thighs, now much improved.CT guided aspiration of paraspinal area and disc at L1-L2completedon 3/27andculture growing pansensitive E. Coli.Plan to treat for 6 to 8 weeks with IV antibiotics. RUE PICC line in place. Abx per ID. Continue LSO brace when OOB. Continue working with therapies and working on pain control.Call with any questions. Plan for CIR.  LOS: 9 days     Council Mechanic, DNP, NP-C 02/05/2021, 8:55 AM

## 2021-02-05 NOTE — Progress Notes (Signed)
Physical Therapy Treatment Patient Details Name: Cassidy Austin MRN: [id number] DOB: 12-Nov-1950 Today's Date: 02/05/2021    History of Present Illness 70 y.o. female admitted from [facility] on 01/27/21 for back pain.  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  Pt s/p CT guided aspiration of R psoas fluid collection and disc space L1-2.  Pt with significant PMH of recent hospitalization 1/31-12/13/20 for pylonephritis and NSTEMI d/c to SNF for rehab, HTN. LSO when OOB.    PT Comments    Pt was able to stand EOB 4 times today with RW and two person assist.  We were able to progress to pre gait (stepping with opposite knee blocked), but she fatigued quickly and we were unable to take pivotal steps to the chair without bil knees flexing slowly towards the ground.   We used the total lift to get her to the chair and she was able to report back to me the exercises she should do while sitting up.  PT will continue to follow acutely for safe mobility progression.  ? Try sara plus electric stander next time?  She is pear shaped and her hips would not fit through the sara steady (standing frame) the last time we tried.    Follow Up Recommendations  CIR     Equipment Recommendations  Wheelchair cushion (measurements PT);Wheelchair (measurements PT);Hospital bed;Other (comment) (hoyer lift)    Recommendations for Other Services       Precautions / Restrictions Precautions Precautions: Fall;Back Precaution Booklet Issued: No Required Braces or Orthoses: Spinal Brace Spinal Brace: Lumbar corset;Applied in sitting position    Mobility  Bed Mobility Overal bed mobility: Needs Assistance Bed Mobility: Rolling;Sidelying to Sit;Sit to Sidelying Rolling: Mod assist Sidelying to sit: Mod assist     Sit to sidelying: Mod assist General bed mobility comments: Mod assist for hips to roll all the way over, mod assist to support trunk to come up to sitting EOB and to support  both legs to return to side lying.    Transfers Overall transfer level: Needs assistance Equipment used: Rolling walker (2 wheeled) Transfers: Sit to/from Stand Sit to Stand: Mod assist;+2 physical assistance         General transfer comment: Mod assist to come to standing from elevated bed with R knee blocked,  Pt having difficulty locking out both knees and extending elbows, so RW lowered.  We stood x 4 for ~45 seconds at at time before sitting back down.  Lifted over to the chair with the maxi move total lift to the chair as she is not strong enough to take pivotal steps safely.  Ambulation/Gait             General Gait Details: unable to take even pivotal steps, attempted stepping forward and backward in place without much success today as bil knees slowly moved into flexion the longer we tried to move her feet.   Stairs             Wheelchair Mobility    Modified Rankin (Stroke Patients Only)       Balance Overall balance assessment: Needs assistance Sitting-balance support: Feet supported;Bilateral upper extremity supported Sitting balance-Leahy Scale: Poor Sitting balance - Comments: needs bil UE support in sitting, posterior lean   Standing balance support: Bilateral upper extremity supported Standing balance-Leahy Scale: Poor Standing balance comment: needs external support from RW and two people.  Cognition Arousal/Alertness: Awake/alert Behavior During Therapy: WFL for tasks assessed/performed Overall Cognitive Status: Within Functional Limits for tasks assessed                                        Exercises Other Exercises Other Exercises: reviewed seated exercises and asked pt to preform 10reps every 30 mins she is up in the chair (LAQs, APs, seated hip flex).    General Comments        Pertinent Vitals/Pain Pain Assessment: 0-10 Pain Score: 7  Pain Location: low back and left leg  today Pain Descriptors / Indicators: Grimacing;Spasm Pain Intervention(s): Limited activity within patient's tolerance;Monitored during session;Repositioned    Home Living                      Prior Function            PT Goals (current goals can now be found in the care plan section) Acute Rehab PT Goals Patient Stated Goal: to go home if she can, but realizes she may need rehab, to walk again, return to her PLOF Progress towards PT goals: Progressing toward goals    Frequency    Min 3X/week      PT Plan Current plan remains appropriate    Co-evaluation              AM-PAC PT "6 Clicks" Mobility   Outcome Measure  Help needed turning from your back to your side while in a flat bed without using bedrails?: A Lot Help needed moving from lying on your back to sitting on the side of a flat bed without using bedrails?: A Lot Help needed moving to and from a bed to a chair (including a wheelchair)?: Total Help needed standing up from a chair using your arms (e.g., wheelchair or bedside chair)?: A Lot Help needed to walk in hospital room?: Total Help needed climbing 3-5 steps with a railing? : Total 6 Click Score: 9    End of Session Equipment Utilized During Treatment: Gait belt;Back brace Activity Tolerance: Patient limited by pain;Patient limited by fatigue Patient left: in chair;with call bell/phone within reach Nurse Communication: Mobility status;Need for lift equipment PT Visit Diagnosis: Muscle weakness (generalized) (M62.81);Difficulty in walking, not elsewhere classified (R26.2);Other symptoms and signs involving the nervous system (W97.989)     Time: 2119-4174 PT Time Calculation (min) (ACUTE ONLY): 37 min  Charges:  $Therapeutic Activity: 23-37 mins          Corinna Capra, PT, DPT  Acute Rehabilitation [phone] pager [phone]) [phone] office

## 2021-02-05 NOTE — Progress Notes (Signed)
PROGRESS NOTE    Cassidy Austin  PQZ:[id number] DOB: Sep 12, 1951 DOA: 01/27/2021 PCP: Laurann Montana, MD    Chief Complaint  Patient presents with  . Back Pain    Brief Narrative:  70yo with a hx of HTN., nephrolithiasis, and hypothyroidism who has been suffering with intractable low back pain since February 2022.  She required hospitalization in late February when she was found to be septic with pyelonephritis.  She was discharged to a rehab facility but but her low back pain has persisted despite the use of prescription medications.  An outpatient MRI of the lumbar spine 3/25 (obtained after outpt NS referral) revealed evidence concerning for lumbar discitis and osteomyelitis and the patient was therefore directed to the ER for admission.  3/27 CT guided aspiration of paraspinal area and disc at L1-L2  Subjective:  Low back pain and muscle spasms is improving   report continued weakness in lower extremities, but appears to continue showing slow improvement   Assessment & Plan:   Principal Problem:   Discitis/osteomyelitis of lumbar region Active Problems:   Hypothyroidism   Hypertension   Intractable pain   Impaired ambulation   Protein-calorie malnutrition, severe   Discitis/osteomyelitis L1-L2 -Present with her intractable low back pain, not able to ambulate, she has a Ct lumber spine on 2/7 which showed degenerative changes, no acute findings, however due to persistent low back pain, MRI of the spine was obtained on March demonstrates which is concerned for discitis and osteomyelitis L1-L2 region -IR aspiration of the infected area on March 27, culture positive for pansensitive E. Coli -Blood culture negative -PICC line placed on 3/30 -was on rocephin, Currently on ancef, plan treat for 6-8 weeks from 3/27, appreciate ID recommendations, f/u with ID on 4/14 at 11:30am   Nephrolithiasis -5 mm nonobstructive ureteric calculi & 3 mm right VUJcalculi that is probably  obstructive noted on CT 12/11/20  -She was evaluated by urology Dr. Marlou Porch during last hospitalization recommended outpatient follow-up, patient report has not follow-up with urology yet -She denies flank pain, I have reviewed urology notes from last hospitalization with a plan of repeat renal ultrasound to follow-up on resolution of right-sided hydronephrosis, repeat renal ultrasound did not show hydronephrosis -Continue Flomax F/u with urology outpatient  Normocytic anemia Retic count low, iron panel not consistent with iron deficiency, B12 unremarkable Anemia likely due to bone marrow suppression from ongoing infection  FOBT negative   Hypertension Blood pressure stable on Coreg  Hypothyroidism TSH 2.088 ,continue Synthroid  Constipation Report no BM for 2 weeks Resolved with MiraLAX and Senokot   Mild left common iliac lymphadenopathy, which is nonspecific findings on CT  From 2/7: Recommend continued follow-up by CT in 3 months.  Nutritional Assessment: The patient's BMI is: Body mass index is 40.71 kg/m.Marland Kitchen Meet class III obesity criteria Seen by dietician.  I agree with the assessment and plan as outlined below: Nutrition Status: Nutrition Problem: Severe Malnutrition Etiology: acute illness (lumbar discitis/osteomyelitis) Signs/Symptoms: percent weight loss,energy intake < or equal to 50% for > or equal to 5 days Interventions: Ensure Enlive (each supplement provides 350kcal and 20 grams of protein),MVI  .    Skin Assessment:  I have examined the patient's skin and I agree with the wound assessment as performed by the wound care RN as outlined below:  Pressure Injury 12/10/20 Lumbar Lateral;Right Stage 2 -  Partial thickness loss of dermis presenting as a shallow open injury with a red, pink wound bed without slough. skin peeled and  redness (Active)  12/10/20 2000  Location: Lumbar  Location Orientation: Lateral;Right  Staging: Stage 2 -  Partial thickness loss of  dermis presenting as a shallow open injury with a red, pink wound bed without slough.  Wound Description (Comments): skin peeled and redness  Present on Admission: No    Unresulted Labs (From admission, onward)          Start     Ordered   02/06/21 0500  CBC  Tomorrow morning,   R       Question:  Specimen collection method  Answer:  IV Team=IV Team collect   02/05/21 1703   02/06/21 0500  Basic metabolic panel  Tomorrow morning,   R       Question:  Specimen collection method  Answer:  IV Team=IV Team collect   02/05/21 1703   02/06/21 0500  Magnesium  Tomorrow morning,   R       Question:  Specimen collection method  Answer:  IV Team=IV Team collect   02/05/21 1703   Unscheduled  Occult blood card to lab, stool  As needed,   R      02/04/21 1107            DVT prophylaxis: enoxaparin (LOVENOX) injection 40 mg Start: 02/01/21 2200 SCDs Start: 01/27/21 2026   Code Status: Full Family Communication: Patient Disposition:   Status is: Inpatient  Dispo: The patient is from: Skilled nursing facility              Anticipated d/c is to: SNF              Anticipated d/c date is: Medically ready to discharge to SNF                Consultants:   Neurosurgery  CIR  Infectious disease  Procedures:   IR aspiration of the infected lumbar spine on March 27  PICC line placement on 3/30  Antimicrobials:   Anti-infectives (From admission, onward)   Start     Dose/Rate Route Frequency Ordered Stop   02/01/21 1400  ceFAZolin (ANCEF) IVPB 2g/100 mL premix        2 g 200 mL/hr over 30 Minutes Intravenous Every 8 hours 02/01/21 0910     01/29/21 2000  DAPTOmycin (CUBICIN) 750 mg in sodium chloride 0.9 % IVPB  Status:  Discontinued        750 mg 230 mL/hr over 30 Minutes Intravenous Daily 01/29/21 1521 01/30/21 1424   01/29/21 1700  cefTRIAXone (ROCEPHIN) 2 g in sodium chloride 0.9 % 100 mL IVPB  Status:  Discontinued        2 g 200 mL/hr over 30 Minutes Intravenous Every  24 hours 01/29/21 1521 02/01/21 0910          Objective: Vitals:   02/05/21 0359 02/05/21 0500 02/05/21 0831 02/05/21 1609  BP: 127/80 (!) 166/69 (!) 169/74 140/75  Pulse: 71 65 64 81  Resp: Temp: 98.7 F (37.1 C) 99.2 F (37.3 C) 99 F (37.2 C) 98.7 F (37.1 C)  TempSrc: Oral Oral Oral Oral  SpO2: 99% 99% 100% 97%  Weight:      Height:       No intake or output data in the 24 hours ending 02/05/21 1703 Filed Weights   01/27/21 2215  Weight: 114.4 kg    Examination:  General exam: calm, NAD Respiratory system: Clear to auscultation. Respiratory effort normal. Cardiovascular system: S1 & S2 heard,  RRR.  Soft murmur 1-2/6 left upper sternal border, No pedal edema. Gastrointestinal system: Abdomen is nondistended, soft and nontender.  Normal bowel sounds heard. Central nervous system: Alert and oriented. No focal neurological deficits. Extremities: Able to move toes,  able to lift legs against gravity slightly  Skin: No rashes, lesions or ulcers Psychiatry: Judgement and insight appear normal. Mood & affect appropriate.     Data Reviewed: I have personally reviewed following labs and imaging studies  CBC: Recent Labs  Lab 01/30/21 0236 01/31/21 0505 02/01/21 0242 02/04/21 0334  WBC 7.8 7.1 7.4 7.3  NEUTROABS  --   --  4.7 4.2  HGB 8.9* 8.9* 8.2* 8.2*  HCT 28.0* 28.9* 26.0* 26.0*  MCV 87.8 90.6 88.4 87.8  PLT 245 230 244 259    Basic Metabolic Panel: Recent Labs  Lab 01/30/21 0236 02/01/21 0242 02/04/21 0334  NA 139 139 140  K 3.7 3.7 3.6  CL 106 107 106  CO2 25 27 30   GLUCOSE 119* 107* 103*  BUN 9 11 7*  CREATININE 0.67 0.74 0.72  CALCIUM 8.5* 8.5* 8.6*  MG  --  1.9 2.0    GFR: Estimated Creatinine Clearance: 82.8 mL/min (by C-G formula based on SCr of 0.72 mg/dL).  Liver Function Tests: Recent Labs  Lab 01/30/21 0236  AST 10*  ALT 10  ALKPHOS 65  BILITOT 0.4  PROT 6.2*  ALBUMIN 2.4*    CBG: No results for  input(s): GLUCAP in the last 168 hours.   Recent Results (from the past 240 hour(s))  Culture, blood (routine x 2)     Status: None   Collection Time: 01/27/21  6:33 PM   Specimen: BLOOD  Result Value Ref Range Status   Specimen Description BLOOD LEFT ANTECUBITAL  Final   Special Requests   Final    BOTTLES DRAWN AEROBIC AND ANAEROBIC Blood Culture adequate volume   Culture   Final    NO GROWTH 5 DAYS Performed at [facility], [address], [place], [place]    Report Status 02/01/2021 FINAL  Final  Resp Panel by RT-PCR (Flu A&B, Covid) Nasopharyngeal Swab     Status: None   Collection Time: 01/27/21  6:33 PM   Specimen: Nasopharyngeal Swab; Nasopharyngeal(NP) swabs in vial transport medium  Result Value Ref Range Status   SARS Coronavirus 2 by RT PCR NEGATIVE NEGATIVE Final    Comment: (NOTE) SARS-CoV-2 target nucleic acids are NOT DETECTED.  The SARS-CoV-2 RNA is generally detectable in upper respiratory specimens during the acute phase of infection. The lowest concentration of SARS-CoV-2 viral copies this assay can detect is 138 copies/mL. A negative result does not preclude SARS-Cov-2 infection and should not be used as the sole basis for treatment or other patient management decisions. A negative result may occur with  improper specimen collection/handling, submission of specimen other than nasopharyngeal swab, presence of viral mutation(s) within the areas targeted by this assay, and inadequate number of viral copies(<138 copies/mL). A negative result must be combined with clinical observations, patient history, and epidemiological information. The expected result is Negative.  Fact Sheet for Patients:  01/29/21  Fact Sheet for Healthcare Providers:  BloggerCourse.com  This test is no t yet approved or cleared by the [place] FDA and  has been authorized for detection and/or diagnosis of  SARS-CoV-2 by FDA under an Emergency Use Authorization (EUA). This EUA will remain  in effect (meaning this test can be used) for the duration of  the COVID-19 declaration under Section 564(b)(1) of the Act, 21 U.S.C.section 360bbb-3(b)(1), unless the authorization is terminated  or revoked sooner.       Influenza A by PCR NEGATIVE NEGATIVE Final   Influenza B by PCR NEGATIVE NEGATIVE Final    Comment: (NOTE) The Xpert Xpress SARS-CoV-2/FLU/RSV plus assay is intended as an aid in the diagnosis of influenza from Nasopharyngeal swab specimens and should not be used as a sole basis for treatment. Nasal washings and aspirates are unacceptable for Xpert Xpress SARS-CoV-2/FLU/RSV testing.  Fact Sheet for Patients: BloggerCourse.comhttps://www.fda.gov/media/152166/download  Fact Sheet for Healthcare Providers: SeriousBroker.ithttps://www.fda.gov/media/152162/download  This test is not yet approved or cleared by the [place] FDA and has been authorized for detection and/or diagnosis of SARS-CoV-2 by FDA under an Emergency Use Authorization (EUA). This EUA will remain in effect (meaning this test can be used) for the duration of the COVID-19 declaration under Section 564(b)(1) of the Act, 21 U.S.C. section 360bbb-3(b)(1), unless the authorization is terminated or revoked.  Performed at [facility], [address] [place], [place] [address]   Urine culture     Status: Abnormal   Collection Time: 01/27/21  6:33 PM   Specimen: Urine, Random  Result Value Ref Range Status   Specimen Description URINE, RANDOM  Final   Special Requests   Final    NONE Performed at [facility], [address] [place], [place] [address]    Culture MULTIPLE SPECIES PRESENT, SUGGEST RECOLLECTION (A)  Final   Report Status 01/29/2021 FINAL  Final  Aerobic/Anaerobic Culture (surgical/deep wound)     Status: None   Collection Time: 01/28/21 10:47 AM   Specimen: Fluid; Tissue  Result Value Ref Range Status   Specimen  Description FLUID L1/L2 INTERVERTEBRAL DISC AND PARASPINAL  Final   Special Requests NONE  Final   Gram Stain   Final    MODERATE WBC PRESENT, PREDOMINANTLY PMN NO ORGANISMS SEEN    Culture   Final    RARE ESCHERICHIA COLI NO ANAEROBES ISOLATED Performed at [facility], [address] [place], [place] [address]    Report Status 02/02/2021 FINAL  Final   Organism ID, Bacteria ESCHERICHIA COLI  Final      Susceptibility   Escherichia coli - MIC*    AMPICILLIN 4 SENSITIVE Sensitive     CEFAZOLIN <=4 SENSITIVE Sensitive     CEFEPIME <=0.12 SENSITIVE Sensitive     CEFTAZIDIME <=1 SENSITIVE Sensitive     CEFTRIAXONE <=0.25 SENSITIVE Sensitive     CIPROFLOXACIN <=0.25 SENSITIVE Sensitive     GENTAMICIN <=1 SENSITIVE Sensitive     IMIPENEM <=0.25 SENSITIVE Sensitive     TRIMETH/SULFA <=20 SENSITIVE Sensitive     AMPICILLIN/SULBACTAM <=2 SENSITIVE Sensitive     PIP/TAZO <=4 SENSITIVE Sensitive     * RARE ESCHERICHIA COLI         Radiology Studies: No results found.      Scheduled Meds: . carvedilol  12.5 mg Oral BID  . Chlorhexidine Gluconate Cloth  6 each Topical Daily  . cholecalciferol  1,000 Units Oral BID  . enoxaparin (LOVENOX) injection  40 mg Subcutaneous Q24H  . ezetimibe  10 mg Oral Daily  . levothyroxine  100 mcg Oral Q0600  . multivitamin with minerals  1 tablet Oral Daily  . sodium chloride flush  10-40 mL Intracatheter Q12H  . tamsulosin  0.4 mg Oral QPC breakfast   Continuous Infusions: .  ceFAZolin (ANCEF) IV 2 g (02/05/21 1520)  LOS: 9 days   Time spent: Greater than 50% of this time was spent in counseling, explanation of diagnosis, planning of further management, and coordination of care.   Voice Recognition [employer]ubin [employer] dictation system was used to create this note, attempts have been made to correct errors. Please contact the author with questions and/or clarifications.   Albertine Grates, MD PhD FACP [facility]  Available via  Epic secure chat 7am-7pm for nonurgent issues Please page for urgent issues To page the attending provider between 7A-7P or the covering provider during after hours 7P-7A, please log into the web site [email or web address] and access using universal [employer] password for that web site. If you do not have the password, please call the hospital operator.    02/05/2021, 5:03 PM

## 2021-02-05 NOTE — Care Management Important Message (Signed)
Important Message  Patient Details  Name: Cassidy Austin MRN: [id number] Date of Birth: 13-Oct-1951   Medicare Important Message Given:  Yes     Vedanshi Massaro P Tan Clopper 02/05/2021, 2:58 PM

## 2021-02-05 NOTE — H&P (Incomplete)
Physical Medicine and Rehabilitation Admission H&P    Chief Complaint  Patient presents with  . Back Pain  : HPI: Cassidy Austin is a 70 year old right-handed female history of hypertension, hypothyroidism, intractable back pain.  Recent hospitalization 12/04/2020 to 12/13/2020 for pyelonephritis and non-STEMI discharged to skilled nursing facility.  During latest admission she was treated with IV antibiotics x14 days.  CT head abdomen pelvis 12/11/2020 showed mild right hydroureteronephrosis due to a 2-3 mm calculus in bladder in the region of the right UVJ.  Prior to skilled nursing facility placement patient was independent.  Plans to stay with her nephew on discharge.  Presented 01/27/2021 with increasing back pain radiating to the lower extremities and difficulty in ambulation.  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  Intradiscal abscess extending into the right psoas not excluded.  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  Patient underwent CT-guided aspiration 01/28/2021 with cultures growing E. coli.  Infectious disease consulted currently maintained on cefazolin through 03/14/2021.  Hospital course acute on chronic anemia 8.2 and monitored with occult blood testing negative.  Patient was cleared to begin Lovenox for DVT prophylaxis.  Therapy evaluations completed due to patient decreased functional mobility was admitted for a comprehensive rehab program.  Review of Systems  Constitutional: Negative for chills and fever.  HENT: Negative for hearing loss.   Eyes: Negative for blurred vision and double vision.  Respiratory: Negative for cough and shortness of breath.   Cardiovascular: Positive for leg swelling. Negative for chest pain and palpitations.  Gastrointestinal: Positive for constipation. Negative for heartburn, nausea and vomiting.  Genitourinary: Positive for  urgency. Negative for dysuria, flank pain and hematuria.  Musculoskeletal: Positive for joint pain and myalgias.  Skin: Negative for rash.  Neurological: Positive for sensory change and weakness.  All other systems reviewed and are negative.  Past Medical History:  Diagnosis Date  . Complication of anesthesia    hard to wake up after eye surgery in 1970s  . Goiter    with thyroid nodule, FNA 12/15 benign follicular nodule  . Hypercholesterolemia   . Hypertension   . Hypothyroidism   . OA (osteoarthritis) of knee    mod.  Marland Kitchen Spondylolisthesis of lumbar region    Past Surgical History:  Procedure Laterality Date  . COLONOSCOPY    . EYE SURGERY    . THYROID SURGERY  1999   Family History  Problem Relation Age of Onset  . Heart failure Mother   . Breast cancer Neg Hx    Social History:  reports that she has never smoked. She has never used smokeless tobacco. She reports that she does not drink alcohol and does not use drugs. Allergies: No Known Allergies Medications Prior to Admission  Medication Sig Dispense Refill  . ASPERCREME LIDOCAINE 4 % PTCH Apply 1 patch topically daily.    Marland Kitchen aspirin 81 MG chewable tablet Chew 81 mg by mouth every morning.    . carvedilol (COREG) 12.5 MG tablet Take 12.5 mg by mouth 2 (two) times daily.    . cholecalciferol (VITAMIN D) 1000 UNITS tablet Take 1,000 Units by mouth 2 (two) times daily.    . cyclobenzaprine (FLEXERIL) 5 MG tablet Take 5 mg by mouth 3 (three) times daily as needed for muscle spasms.    Marland Kitchen ezetimibe (ZETIA) 10 MG tablet Take 10 mg by mouth daily.    Marland Kitchen gabapentin (NEURONTIN) 100 MG capsule Take 1  capsule (100 mg total) by mouth 3 (three) times daily. 21 capsule 0  . Infant Care Products [employer]) OINT Apply 1 application topically daily.    Marland Kitchen levothyroxine (SYNTHROID, LEVOTHROID) 100 MCG tablet Take 100 mcg by mouth daily at 6 (six) AM.    . Magnesium 250 MG TABS Take 500 mg by mouth every morning.    .  olmesartan-hydrochlorothiazide (BENICAR HCT) 40-25 MG tablet Take 1 tablet by mouth daily.    Marland Kitchen oxyCODONE (ROXICODONE) 5 MG immediate release tablet Take 1 tablet (5 mg total) by mouth every 6 (six) hours as needed for severe pain. 15 tablet 0  . Skin Protectants, Misc. (MINERIN CREME) CREA Apply 1 application topically daily.    . tamsulosin (FLOMAX) 0.4 MG CAPS capsule Take 1 capsule (0.4 mg total) by mouth daily after breakfast. 30 capsule   . tetrahydrozoline 0.05 % ophthalmic solution Place 1 drop into both eyes daily as needed (dry eyes).    Marland Kitchen tiZANidine (ZANAFLEX) 2 MG tablet Take 1 tablet (2 mg total) by mouth every 8 (eight) hours as needed for muscle spasms. 21 tablet 0  . Turmeric 500 MG CAPS Take 1 capsule by mouth 2 (two) times daily.    . vitamin C (ASCORBIC ACID) 500 MG tablet Take 500 mg by mouth daily.    . traMADol (ULTRAM) 50 MG tablet Take 50 mg by mouth every 6 (six) hours as needed for moderate pain.      Drug Regimen Review Drug regimen was reviewed and remains appropriate with no significant issues identified.  Home: Home Living Family/patient expects to be discharged to:: Private residence Living Arrangements: Other (Comment) (plans to go home with nephew for ahwile) Available Help at Discharge: Family,Available PRN/intermittently (nephew works fulltime) Type of Home: [employer] Home Access: Stairs to enter Secretary/administrator of Steps: 2 Entrance Stairs-Rails: None Home Layout: Two level,Bed/bath upstairs Alternate Level Stairs-Number of Steps: full flight Alternate Level Stairs-Rails: Right,Left (initially can reach both then then has to choose) Bathroom Shower/Tub: Health visitor: Standard Home Equipment: Wheelchair - manual Additional Comments: unsure if she has if access to walker   Functional History: Prior Function Level of Independence: Independent Comments: prior to February admission she was independent. At [facility] if she could  achieve adequate pain relief she was able to stand with +1 assist and needed assist with ADLs.  Functional Status:  Mobility: Bed Mobility Overal bed mobility: Needs Assistance Bed Mobility: Rolling,Sidelying to Sit,Sit to Sidelying Rolling: Mod assist Sidelying to sit: Mod assist Sit to sidelying: Mod assist General bed mobility comments: Mod assist to help control hips and legs during log roll transition up to sitting and back down to side lying. Transfers Overall transfer level: Needs assistance Equipment used: Rolling walker (2 wheeled) Transfer via Lift Equipment: Maximove Transfers: Sit to/from Stand Sit to Stand: Mod assist,+2 physical assistance,From elevated surface General transfer comment: Mod assist with R knee blocked from elevated bed x4, slowly fatigued the more we tried.  Transitioned OOB to the recliner chair with maxi move lift for safety. Ambulation/Gait General Gait Details: Unable to weight shift to move feet    ADL: ADL Overall ADL's : Needs assistance/impaired Eating/Feeding: Set up,Bed level Eating/Feeding Details (indicate cue type and reason): vs supported sitting Grooming: Set up,Bed level Grooming Details (indicate cue type and reason): vs supported sitting Upper Body Bathing: Maximal assistance,Sitting Upper Body Bathing Details (indicate cue type and reason): Pt needing BUE support to static sit EOB. Either Max A in seated  position or mod A at bed level to wash back Lower Body Bathing: Maximal assistance,+2 for physical assistance,Bed level Lower Body Bathing Details (indicate cue type and reason): struggled with lateral leans to either side. for pericare would need to complete bed level at this time. Upper Body Dressing : Moderate assistance,Maximal assistance,Sitting Upper Body Dressing Details (indicate cue type and reason): Able to briefly sit with single extremity support to reposition each breast during donning of back brace. Lower Body Dressing:  Maximal assistance,+2 for physical assistance,Bed level General ADL Comments: Pt completed bed mobility, sat EOB with BUE support. Then completed 2/3 partial stands from elevated bed height. Cyd Silenceried Stedy on third trial though pt too fatigued to clear hips at that point.  Cognition: Cognition Overall Cognitive Status: Within Functional Limits for tasks assessed Orientation Level: Oriented X4 Cognition Arousal/Alertness: Awake/alert Behavior During Therapy: WFL for tasks assessed/performed Overall Cognitive Status: Within Functional Limits for tasks assessed  Physical Exam: Blood pressure (!) 169/74, pulse 64, temperature 99 F (37.2 C), temperature source Oral, resp. rate 17, height 5\' 6"  (1.676 m), weight 114.4 kg, SpO2 100 %. Physical Exam Neurological:     Comments: Patient is alert in no acute distress.  Oriented x3 and follows commands.     Results for orders placed or performed during the hospital encounter of 01/27/21 (from the past 48 hour(s))  CBC with Differential/Platelet     Status: Abnormal   Collection Time: 02/04/21  3:34 AM  Result Value Ref Range   WBC 7.3 4.0 - 10.5 K/uL   RBC 2.96 (L) 3.87 - 5.11 MIL/uL   Hemoglobin 8.2 (L) 12.0 - 15.0 g/dL   HCT 16.126.0 (L) 09.636.0 - 04.546.0 %   MCV 87.8 80.0 - 100.0 fL   MCH 27.7 26.0 - 34.0 pg   MCHC 31.5 30.0 - 36.0 g/dL   RDW 40.916.7 (H) 81.111.5 - 91.415.5 %   Platelets 259 150 - 400 K/uL   nRBC 0.0 0.0 - 0.2 %   Neutrophils Relative % 57 %   Neutro Abs 4.2 1.7 - 7.7 K/uL   Lymphocytes Relative 23 %   Lymphs Abs 1.7 0.7 - 4.0 K/uL   Monocytes Relative 9 %   Monocytes Absolute 0.7 0.1 - 1.0 K/uL   Eosinophils Relative 10 %   Eosinophils Absolute 0.7 (H) 0.0 - 0.5 K/uL   Basophils Relative 1 %   Basophils Absolute 0.1 0.0 - 0.1 K/uL   Immature Granulocytes 0 %   Abs Immature Granulocytes 0.02 0.00 - 0.07 K/uL    Comment: Performed at [facility], [address] [place], [place] [address]  Basic metabolic panel     Status:  Abnormal   Collection Time: 02/04/21  3:34 AM  Result Value Ref Range   Sodium 140 135 - 145 mmol/L   Potassium 3.6 3.5 - 5.1 mmol/L   Chloride 106 98 - 111 mmol/L   CO2 30 22 - 32 mmol/L   Glucose, Bld 103 (H) 70 - 99 mg/dL    Comment: Glucose reference range applies only to samples taken after fasting for at least 8 hours.   BUN 7 (L) 8 - 23 mg/dL   Creatinine, Ser 6.210.72 0.44 - 1.00 mg/dL   Calcium 8.6 (L) 8.9 - 10.3 mg/dL   GFR, Estimated >30>60 >86>60 mL/min    Comment: (NOTE) Calculated using the CKD-EPI Creatinine Equation (2021)    Anion gap 4 (L) 5 - 15    Comment: Performed at [facility], [address] [place], [place] [address]  Magnesium     Status: None   Collection Time: 02/04/21  3:34 AM  Result Value Ref Range   Magnesium 2.0 1.7 - 2.4 mg/dL    Comment: Performed at [facility], [address], [place], [place] [address]  Occult blood card to lab, stool     Status: None   Collection Time: 02/04/21 12:19 PM  Result Value Ref Range   Fecal Occult Bld NEGATIVE NEGATIVE    Comment: Performed at [facility], [address] [place], [place] [address]   No results found.     Medical Problem List and Plan: 1.  Decreased functional ability with intractable back pain secondary to discitis/osteomyelitis at L2  -patient may *** shower  -ELOS/Goals: *** 2.  Antithrombotics: -DVT/anticoagulation: Lovenox.  Check vascular study  -antiplatelet therapy: N/A 3. Pain Management: Tramadol/oxycodone as needed, Skelaxin 400 mg 3 times daily as needed 4. Mood: Provide emotional support  -antipsychotic agents: N/A 5. Neuropsych: This patient is capable of making decisions on her own behalf. 6. Skin/Wound Care: Routine skin checks 7. Fluids/Electrolytes/Nutrition: Routine in and outs with follow-up chemistries 8.  ID.  CT-guided aspiration paraspinal area pansensitive E. coli.  Continue intravenous cefazolin 2 g every 8 hours through 03/14/2021 per infectious  disease 9.  Hypertension.  Coreg 12.5 mg twice daily.  Monitor with increased mobility 10.  Hyperlipidemia.  Zetia 11.  Hypothyroidism.  Synthroid 12.  History of nephrolithiasis.  Continue Flomax.  Follow-up urology outpatient. 13.  Normocytic anemia.  Follow-up CBC ***  Charlton Amor, PA-C 02/05/2021

## 2021-02-05 NOTE — Progress Notes (Signed)
Nutrition Follow Up  DOCUMENTATION CODES:   Severe malnutrition in context of acute illness/injury  INTERVENTION:    Magic cup TID with meals, each supplement provides 290 kcal and 9 grams of protein  MVI daily   NUTRITION DIAGNOSIS:   Severe Malnutrition related to acute illness (lumbar discitis/osteomyelitis) as evidenced by percent weight loss,energy intake < or equal to 50% for > or equal to 5 days.  Ongoing  GOAL:   Patient will meet greater than or equal to 90% of their needs   Progressing   MONITOR:   PO intake,Supplement acceptance,Weight trends,Labs,I & O's,Skin  REASON FOR ASSESSMENT:   Malnutrition Screening Tool    ASSESSMENT:   Patient with PMH significant for HTN and hypothyroidism. Presents this admission with impaired ambulation secondary to lumbar discitis/osteomyelitis.  3/27- s/p CT guided aspiration of paraspinal area and disc at L1-L2  Intake increasing. Last two meal completions charted as 100%. Patient not taking Ensure as it upset her stomach. RD to provide Magic Cup for added protein.   Awaiting d/c to CIR.   Admission weight: 114.4 kg (no current weight obtained)  Medications: vitamin D Labs: CBG 103-119  Diet Order:   Diet Order            Diet regular Room service appropriate? Yes; Fluid consistency: Thin  Diet effective now                 EDUCATION NEEDS:   Education needs have been addressed  Skin:  Skin Integrity Issues:: Other (Comment) Other: skin tear- L thigh  Last BM:  4/3  Height:   Ht Readings from Last 1 Encounters:  01/27/21 5\' 6"  (1.676 m)    Weight:   Wt Readings from Last 1 Encounters:  01/27/21 114.4 kg    BMI:  Body mass index is 40.71 kg/m.  Estimated Nutritional Needs:   Kcal:  1800-2000 kcal  Protein:  90-105 grams  Fluid:  >/= 1.8 L/day  01/29/21 RD, LDN Clinical Nutrition Pager listed in AMION

## 2021-02-05 NOTE — TOC Progression Note (Addendum)
Transition of Care [facility]) - Progression Note    Patient Details  Name: Cassidy Austin MRN: [id number] Date of Birth: April 25, 1951  Transition of Care [facility]) CM/SW Contact  Erin Sons, [place] Phone Number: 02/05/2021, 4:20 PM  Clinical Narrative:     CSW informed pt that insurance denied CIR authorization and peer to peer. CSW explained options between SNF vs HH. CSW explained that MD believes she would need SNF unless she had 24/7 supervision which she does not. Pt is agreeable to SNF. [place] facilities [place].   CSW paper faxed to the following:  Called and left message with [facility] [place]; faxed referral to [phone]  Called and left message with [facility] in [place]; faxed referral to [phone]  Called and left message with [facility] in [place]; faxed referral to [phone]  Called the [facility] in [place]; admissions does not have a voicemail box; referral faxed to [phone]  Expected Discharge Plan: Skilled Nursing Facility (vs home health services) Barriers to Discharge: Continued Medical Work up  Expected Discharge Plan and Services Expected Discharge Plan: Skilled Nursing Facility (vs home health services)   Discharge Planning Services: CM Consult   Living arrangements for the past 2 months: Skilled Nursing Facility                 DME Arranged: Other see comment ([facility]) DME Agency: Other - Comment Date DME Agency Contacted: 01/30/21 Time DME Agency Contacted: 1256 Representative spoke with at DME Agency: Pam HH Arranged: RN HH Agency: [facility] Date [facility] Agency Contacted: 01/30/21 Time HH Agency Contacted: 1259 Representative spoke with at [facility] Agency: Kandee Keen   Social Determinants of Health (SDOH) Interventions    Readmission Risk Interventions No flowsheet data found.

## 2021-02-05 NOTE — Progress Notes (Addendum)
Inpatient Rehab Admissions Coordinator:   Awaiting determination from [facility] regarding prior auth request for CIR.  Will continue to follow.   Addendum 1429: Received initial denial from [facility] and set up peer to peer review with attending provider.  If not completed today, I will set up with Dr. Renford Dills tomorrow.    Estill Dooms, PT, DPT Admissions Coordinator [phone] 02/05/21  11:08 AM

## 2021-02-06 LAB — BASIC METABOLIC PANEL
Anion gap: 7 (ref 5–15)
BUN: 5 mg/dL — ABNORMAL LOW (ref 8–23)
CO2: 28 mmol/L (ref 22–32)
Calcium: 8.7 mg/dL — ABNORMAL LOW (ref 8.9–10.3)
Chloride: 103 mmol/L (ref 98–111)
Creatinine, Ser: 0.67 mg/dL (ref 0.44–1.00)
GFR, Estimated: >60 mL/min (ref >60)
Glucose, Bld: 87 mg/dL (ref 70–99)
Potassium: 3.5 mmol/L (ref 3.5–5.1)
Sodium: 138 mmol/L (ref 135–145)

## 2021-02-06 LAB — CBC
HCT: 26.3 % — ABNORMAL LOW (ref 36.0–46.0)
Hemoglobin: 8.4 g/dL — ABNORMAL LOW (ref 12.0–15.0)
MCH: 28 pg (ref 26.0–34.0)
MCHC: 31.9 g/dL (ref 30.0–36.0)
MCV: 87.7 fL (ref 80.0–100.0)
Platelets: 277 10*3/uL (ref 150–400)
RBC: 3 MIL/uL — ABNORMAL LOW (ref 3.87–5.11)
RDW: 16.8 % — ABNORMAL HIGH (ref 11.5–15.5)
WBC: 7.6 10*3/uL (ref 4.0–10.5)
nRBC: 0 % (ref 0.0–0.2)

## 2021-02-06 LAB — MAGNESIUM: Magnesium: 1.9 mg/dL (ref 1.7–2.4)

## 2021-02-06 MED ORDER — ENSURE ENLIVE PO LIQD
237.0000 mL | Freq: Two times a day (BID) | ORAL | Status: DC
  Administered 2021-02-06 – 2021-02-07 (×4): 237 mL via ORAL

## 2021-02-06 MED ORDER — GABAPENTIN 100 MG PO CAPS
100.0000 mg | ORAL_CAPSULE | Freq: Three times a day (TID) | ORAL | Status: DC
  Administered 2021-02-06 – 2021-02-07 (×5): 100 mg via ORAL
  Filled 2021-02-06 (×5): qty 1

## 2021-02-06 MED ORDER — HYDROCHLOROTHIAZIDE 25 MG PO TABS
25.0000 mg | ORAL_TABLET | Freq: Every day | ORAL | Status: DC
  Administered 2021-02-06 – 2021-02-07 (×2): 25 mg via ORAL
  Filled 2021-02-06 (×2): qty 1

## 2021-02-06 MED ORDER — ASPIRIN EC 81 MG PO TBEC
81.0000 mg | DELAYED_RELEASE_TABLET | Freq: Every day | ORAL | Status: DC
  Administered 2021-02-06 – 2021-02-07 (×2): 81 mg via ORAL
  Filled 2021-02-06 (×2): qty 1

## 2021-02-06 MED ORDER — IRBESARTAN 300 MG PO TABS
300.0000 mg | ORAL_TABLET | Freq: Every day | ORAL | Status: DC
  Administered 2021-02-06 – 2021-02-07 (×2): 300 mg via ORAL
  Filled 2021-02-06 (×2): qty 1

## 2021-02-06 NOTE — Progress Notes (Addendum)
Subjective: Patient reports "I'm not hurting right now"  Objective: Vital signs in last 24 hours: Temp:  [98.5 F (36.9 C)-99 F (37.2 C)] 98.5 F (36.9 C) (04/05 0434) Pulse Rate:  [63-81] 63 (04/05 0434) Resp:  [16-17] 16 (04/05 0434) BP: (140-169)/(68-75) 162/68 (04/05 0434) SpO2:  [97 %-100 %] 99 % (04/05 0434)  Intake/Output from previous day: 04/04 0701 - 04/05 0700 In: -  Out: 450 [Urine:450] Intake/Output this shift: No intake/output data recorded.  Alert, conversant reporting no pain at present. Anxious and hopeful for CIR approval, stating she is now able to stand with assistance, but unable to walk.  Strength is improving, but she is severely limited due to weeks of bedrest. She acknowledges the need to turn in bed, but states she typically only turns when being assisted by staff.   Lab Results: Recent Labs    02/04/21 0334 02/06/21 0401  WBC 7.3 7.6  HGB 8.2* 8.4*  HCT 26.0* 26.3*  PLT 259 277   BMET Recent Labs    02/04/21 0334 02/06/21 0401  NA 140 138  K 3.6 3.5  CL 106 103  CO2 30 28  GLUCOSE 103* 87  BUN 7* 5*  CREATININE 0.72 0.67  CALCIUM 8.6* 8.7*    Studies/Results: No results found.  Assessment/Plan: improving  LOS: 10 days  IVAB continue via PICC. Peer-to-peer planned for CIR justification. Supportive care continues.   Georgiann Cocker 02/06/2021, 7:55 AM  Patient is doing well.  Awaiting Rehab.

## 2021-02-06 NOTE — TOC Progression Note (Addendum)
Transition of Care [facility]) - Progression Note    Patient Details  Name: Sereen Schaff MRN: [id number] Date of Birth: [birth year]  [facility]) CM/SW Contact  Erin Sons, [place] Phone Number: 02/06/2021, 11:03 AM  Clinical Narrative:     Called [facility] [place]; left message requesting return call.   Called the [facility]; reached someone in admissions who stated they would review and call CSW back  Called the [facility]; reached someone in admission who stated they would review and call CSW back  [place] from [facility] stated they do not take humana.   1330: [facility] not [facility] with [facility] [employer] [facility].   [facility] is reviewing  [facility] can offer a bed but pt would be required to pay 21 days of copay fees up front  [facility] would require 5-7 days up front copay payments  CSW is waiting to hear back from [facility].   Pt states she cannot afford [facility] or [facility]; she would likely be deciding between [facility] and [facility]. CSW waiting on response from [facility].   1615: [facility] would not require pt to pay copays up front and can accept pt. Pt is on the fence about facility. She states she cannot afford upfront payments for her preferred facilities. Pt requests [facility]. [facility] does not have beds available currently which CSW notified pt of. CSW explained that pt would need to make a decision as she is medically ready and has multiple SNF options. Pt chooses [facility] in [place]. CSW called Selena Batten with [facility]; she will start auth today. Covid test requested.    Expected Discharge Plan: Skilled Nursing Facility (vs home health services) Barriers to Discharge: Continued Medical Work up  Expected Discharge Plan and Services Expected Discharge Plan: Skilled Nursing Facility (vs home health services)   Discharge Planning Services: CM Consult   Living arrangements for the past 2 months: Skilled Nursing Facility                  DME Arranged: Other see comment ([facility]) DME Agency: Other - Comment Date DME Agency Contacted: 01/30/21 Time DME Agency Contacted: [phone] Representative spoke with at DME Agency: Pam HH Arranged: RN HH Agency: [facility] Date [facility] Agency Contacted: 01/30/21 Time HH Agency Contacted: 1259 Representative spoke with at [facility] Agency: Kandee Keen   Social Determinants of Health (SDOH) Interventions    Readmission Risk Interventions No flowsheet data found.

## 2021-02-06 NOTE — Progress Notes (Signed)
Inpatient Rehab Admissions Coordinator:   Late entry: Notified by insurance that request for CIR has been denied.  Peer to peer complete and denial remains.  Will need to seek f/u therapy in a lower level of care.  CIR will sign off at this time.   Estill Dooms, PT, DPT Admissions Coordinator [phone] 02/06/21  8:12 AM

## 2021-02-06 NOTE — Progress Notes (Signed)
PROGRESS NOTE    Cassidy Austin  ZOX:[id number] DOB: 12-24-1950 DOA: 01/27/2021 PCP: Laurann Montana, MD   Chief Complain: Back pain  Brief Narrative: Patient is a 70 year old female with history of hypertension, nephrolithiasis, hypothyroidism, chronic back pain, recent history of sepsis with pyelonephritis who was admitted after outpatient MRI of the lumbar spine showed discitis/osteomyelitis.  Underwent CT-guided aspiration of paraspinal area and days at L1-L2. PT/OT recommended CIR but she was declined. plan is to  discharge to skilled nursing facility and continue IV antibiotics till 03/11/21 .  Patient is medically stable for discharge to skilled nursing facility as soon as bed is available.  Assessment & Plan:   Principal Problem:   Discitis/osteomyelitis of lumbar region Active Problems:   Hypothyroidism   Hypertension   Intractable pain   Impaired ambulation   Protein-calorie malnutrition, severe   L1-L2 discitis/osteomyelitis: Presented with intractable low back pain, not able to ambulate.  MRI done as an outpatient showed discitis/osteomyelitis of L1-L2 region.  IR guided aspiration of the infected area on March 27, culture positive for pansensitive E. coli.  Blood cultures have remained negative.  PICC line placed on 3/30.  ID was following.  Plan is to continue IV antibiotics, cefazolin, till 03/11/2021.  She will follow-up with ID on 4/14 as an outpatient  History of nephrolithiasis:5 mm nonobstructive ureteric calculi & 3 mm right VUJcalculi that is probably obstructive noted on CT 12/11/20  She was evaluated by urology Dr. Marlou Porch during last hospitalization recommended outpatient follow-up, patient report has not follow-up with urology yet.  We recommend urology evaluation as an outpatient.  Continue Flomax.  Ultrasound done here did not show any hydronephrosis.  Normocytic anemia: Iron panel not consistent with iron deficiency or vitamin B12 deficiency, FOBT negative.   Continue monitoring.  Hemoglobin in the range of 8.  Hypertension:On Coreg, irbesartan, hydrochlorothiazide.  She has been noted to be hypertensive since yesterday.  Continue to monitor  Hypothyroidism: Continue Synthyroid  Constipation: Continue bowel regimen  Mild left common iliac lymphadenopathy: Nonspecific finding on CT from 12/11/2020.  Recommend follow-up by CT in 3 months  Debility/deconditioning: PT/OT recommended CIR, insurance declined.  Plan is for skilled nursing facility.  TOC following.  Pressure Injury 12/10/20 Lumbar Lateral;Right Stage 2 -  Partial thickness loss of dermis presenting as a shallow open injury with a red, pink wound bed without slough. skin peeled and redness (Active)  12/10/20 2000  Location: Lumbar  Location Orientation: Lateral;Right  Staging: Stage 2 -  Partial thickness loss of dermis presenting as a shallow open injury with a red, pink wound bed without slough.  Wound Description (Comments): skin peeled and redness  Present on Admission: No       Nutrition Problem: Severe Malnutrition Etiology: acute illness (lumbar discitis/osteomyelitis)      DVT prophylaxis: Lovenox Code Status: Full Family Communication: None at bedside Status is: Inpatient  Remains inpatient appropriate because:Inpatient level of care appropriate due to severity of illness   Dispo:  Patient From: Home  Planned Disposition: Skilled Nursing Facility  Medically stable for discharge: Yes      Consultants: IR  Procedures:Disc aspiration  Antimicrobials:  Anti-infectives (From admission, onward)   Start     Dose/Rate Route Frequency Ordered Stop   02/01/21 1400  ceFAZolin (ANCEF) IVPB 2g/100 mL premix        2 g 200 mL/hr over 30 Minutes Intravenous Every 8 hours 02/01/21 0910     01/29/21 2000  DAPTOmycin (CUBICIN) 750 mg in sodium  chloride 0.9 % IVPB  Status:  Discontinued        750 mg 230 mL/hr over 30 Minutes Intravenous Daily 01/29/21 1521 01/30/21  1424   01/29/21 1700  cefTRIAXone (ROCEPHIN) 2 g in sodium chloride 0.9 % 100 mL IVPB  Status:  Discontinued        2 g 200 mL/hr over 30 Minutes Intravenous Every 24 hours 01/29/21 1521 02/01/21 0910      Subjective: Patient seen and examined the bedside this afternoon.  Comfortable.  Her back pain is much better today.  She was sitting on  the chair.  She denies any complaints  Objective: Vitals:   02/05/21 1609 02/05/21 2148 02/06/21 0434 02/06/21 0756  BP: 140/75 (!) 160/69 (!) 162/68 (!) 162/84  Pulse: 81 73 63 70  Resp: 17 16 16 17   Temp: 98.7 F (37.1 C) 98.7 F (37.1 C) 98.5 F (36.9 C) 98.1 F (36.7 C)  TempSrc: Oral Oral Oral Oral  SpO2: 97% 98% 99% 97%  Weight:      Height:        Intake/Output Summary (Last 24 hours) at 02/06/2021 1331 Last data filed at 02/06/2021 0900 Gross per 24 hour  Intake 240 ml  Output 450 ml  Net -210 ml   Filed Weights   01/27/21 2215  Weight: 114.4 kg    Examination:  General exam: Overall comfortable, not in distress,obese HEENT: PERRL Respiratory system:  no wheezes or crackles  Cardiovascular system: S1 & S2 heard, RRR.  Gastrointestinal system: Abdomen is nondistended, soft and nontender. Central nervous system: Alert and oriented Extremities: No edema, no clubbing ,no cyanosis, PICC line on the right arm Skin: No rashes, no ulcers,no icterus   Data Reviewed: I have personally reviewed following labs and imaging studies  CBC: Recent Labs  Lab 01/31/21 0505 02/01/21 0242 02/04/21 0334 02/06/21 0401  WBC 7.1 7.4 7.3 7.6  NEUTROABS  --  4.7 4.2  --   HGB 8.9* 8.2* 8.2* 8.4*  HCT 28.9* 26.0* 26.0* 26.3*  MCV 90.6 88.4 87.8 87.7  PLT 230 244 259 277   Basic Metabolic Panel: Recent Labs  Lab 02/01/21 0242 02/04/21 0334 02/06/21 0401  NA 139 140 138  K 3.7 3.6 3.5  CL 107 106 103  CO2 27 30 28   GLUCOSE 107* 103* 87  BUN 11 7* 5*  CREATININE 0.74 0.72 0.67  CALCIUM 8.5* 8.6* 8.7*  MG 1.9 2.0 1.9    GFR: Estimated Creatinine Clearance: 82.8 mL/min (by C-G formula based on SCr of 0.67 mg/dL). Liver Function Tests: No results for input(s): AST, ALT, ALKPHOS, BILITOT, PROT, ALBUMIN in the last 168 hours. No results for input(s): LIPASE, AMYLASE in the last 168 hours. No results for input(s): AMMONIA in the last 168 hours. Coagulation Profile: No results for input(s): INR, PROTIME in the last 168 hours. Cardiac Enzymes: No results for input(s): CKTOTAL, CKMB, CKMBINDEX, TROPONINI in the last 168 hours. BNP (last 3 results) No results for input(s): PROBNP in the last 8760 hours. HbA1C: No results for input(s): HGBA1C in the last 72 hours. CBG: No results for input(s): GLUCAP in the last 168 hours. Lipid Profile: No results for input(s): CHOL, HDL, LDLCALC, TRIG, CHOLHDL, LDLDIRECT in the last 72 hours. Thyroid Function Tests: No results for input(s): TSH, T4TOTAL, FREET4, T3FREE, THYROIDAB in the last 72 hours. Anemia Panel: No results for input(s): VITAMINB12, FOLATE, FERRITIN, TIBC, IRON, RETICCTPCT in the last 72 hours. Sepsis Labs: No results for input(s): PROCALCITON, LATICACIDVEN  in the last 168 hours.  Recent Results (from the past 240 hour(s))  Culture, blood (routine x 2)     Status: None   Collection Time: 01/27/21  6:33 PM   Specimen: BLOOD  Result Value Ref Range Status   Specimen Description BLOOD LEFT ANTECUBITAL  Final   Special Requests   Final    BOTTLES DRAWN AEROBIC AND ANAEROBIC Blood Culture adequate volume   Culture   Final    NO GROWTH 5 DAYS Performed at [facility], [address], [place], [place] [address]    Report Status 02/01/2021 FINAL  Final  Resp Panel by RT-PCR (Flu A&B, Covid) Nasopharyngeal Swab     Status: None   Collection Time: 01/27/21  6:33 PM   Specimen: Nasopharyngeal Swab; Nasopharyngeal(NP) swabs in vial transport medium  Result Value Ref Range Status   SARS Coronavirus 2 by RT PCR NEGATIVE NEGATIVE Final    Comment:  (NOTE) SARS-CoV-2 target nucleic acids are NOT DETECTED.  The SARS-CoV-2 RNA is generally detectable in upper respiratory specimens during the acute phase of infection. The lowest concentration of SARS-CoV-2 viral copies this assay can detect is 138 copies/mL. A negative result does not preclude SARS-Cov-2 infection and should not be used as the sole basis for treatment or other patient management decisions. A negative result may occur with  improper specimen collection/handling, submission of specimen other than nasopharyngeal swab, presence of viral mutation(s) within the areas targeted by this assay, and inadequate number of viral copies(<138 copies/mL). A negative result must be combined with clinical observations, patient history, and epidemiological information. The expected result is Negative.  Fact Sheet for Patients:  BloggerCourse.com  Fact Sheet for Healthcare Providers:  SeriousBroker.it  This test is no t yet approved or cleared by the [place] FDA and  has been authorized for detection and/or diagnosis of SARS-CoV-2 by FDA under an Emergency Use Authorization (EUA). This EUA will remain  in effect (meaning this test can be used) for the duration of the COVID-19 declaration under Section 564(b)(1) of the Act, 21 U.S.C.section 360bbb-3(b)(1), unless the authorization is terminated  or revoked sooner.       Influenza A by PCR NEGATIVE NEGATIVE Final   Influenza B by PCR NEGATIVE NEGATIVE Final    Comment: (NOTE) The Xpert Xpress SARS-CoV-2/FLU/RSV plus assay is intended as an aid in the diagnosis of influenza from Nasopharyngeal swab specimens and should not be used as a sole basis for treatment. Nasal washings and aspirates are unacceptable for Xpert Xpress SARS-CoV-2/FLU/RSV testing.  Fact Sheet for Patients: BloggerCourse.com  Fact Sheet for Healthcare  Providers: SeriousBroker.it  This test is not yet approved or cleared by the [place] FDA and has been authorized for detection and/or diagnosis of SARS-CoV-2 by FDA under an Emergency Use Authorization (EUA). This EUA will remain in effect (meaning this test can be used) for the duration of the COVID-19 declaration under Section 564(b)(1) of the Act, 21 U.S.C. section 360bbb-3(b)(1), unless the authorization is terminated or revoked.  Performed at [facility], [address] [place], [place] [address]   Urine culture     Status: Abnormal   Collection Time: 01/27/21  6:33 PM   Specimen: Urine, Random  Result Value Ref Range Status   Specimen Description URINE, RANDOM  Final   Special Requests   Final    NONE Performed at [facility], [address] [place], [place] [address]    Culture MULTIPLE SPECIES PRESENT,  SUGGEST RECOLLECTION (A)  Final   Report Status 01/29/2021 FINAL  Final  Aerobic/Anaerobic Culture (surgical/deep wound)     Status: None   Collection Time: 01/28/21 10:47 AM   Specimen: Fluid; Tissue  Result Value Ref Range Status   Specimen Description FLUID L1/L2 INTERVERTEBRAL DISC AND PARASPINAL  Final   Special Requests NONE  Final   Gram Stain   Final    MODERATE WBC PRESENT, PREDOMINANTLY PMN NO ORGANISMS SEEN    Culture   Final    RARE ESCHERICHIA COLI NO ANAEROBES ISOLATED Performed at [facility], [address], [place], [place] [address]    Report Status 02/02/2021 FINAL  Final   Organism ID, Bacteria ESCHERICHIA COLI  Final      Susceptibility   Escherichia coli - MIC*    AMPICILLIN 4 SENSITIVE Sensitive     CEFAZOLIN <=4 SENSITIVE Sensitive     CEFEPIME <=0.12 SENSITIVE Sensitive     CEFTAZIDIME <=1 SENSITIVE Sensitive     CEFTRIAXONE <=0.25 SENSITIVE Sensitive     CIPROFLOXACIN <=0.25 SENSITIVE Sensitive     GENTAMICIN <=1 SENSITIVE Sensitive     IMIPENEM <=0.25 SENSITIVE Sensitive      TRIMETH/SULFA <=20 SENSITIVE Sensitive     AMPICILLIN/SULBACTAM <=2 SENSITIVE Sensitive     PIP/TAZO <=4 SENSITIVE Sensitive     * RARE ESCHERICHIA COLI         Radiology Studies: No results found.      Scheduled Meds: . aspirin EC  81 mg Oral Daily  . carvedilol  12.5 mg Oral BID  . Chlorhexidine Gluconate Cloth  6 each Topical Daily  . cholecalciferol  1,000 Units Oral BID  . enoxaparin (LOVENOX) injection  40 mg Subcutaneous Q24H  . ezetimibe  10 mg Oral Daily  . feeding supplement  237 mL Oral BID BM  . gabapentin  100 mg Oral TID  . hydrochlorothiazide  25 mg Oral Daily  . irbesartan  300 mg Oral Daily  . levothyroxine  100 mcg Oral Q0600  . multivitamin with minerals  1 tablet Oral Daily  . sodium chloride flush  10-40 mL Intracatheter Q12H  . tamsulosin  0.4 mg Oral QPC breakfast   Continuous Infusions: .  ceFAZolin (ANCEF) IV 2 g (02/06/21 0559)     LOS: 10 days    Time spent: More than 50% of that time was spent in counseling and/or coordination of care.      Burnadette Pop, MD [facility] P4/03/2021, 1:31 PM

## 2021-02-06 NOTE — Progress Notes (Addendum)
Occupational Therapy Treatment Patient Details Name: Cassidy Austin MRN: [id number] DOB: [birth year] Today's Date: 02/06/2021    History of present illness 70 y.o. female admitted from [facility] on 01/27/21 for back pain.  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  Pt s/p CT guided aspiration of R psoas fluid collection and disc space L1-2.  Pt with significant PMH of recent hospitalization 1/31-12/13/20 for pylonephritis and NSTEMI d/c to SNF for rehab, HTN. LSO when OOB.   OT comments  Pt progressing towards acute OT goals. Mod A for bed mobility. Pt able to achieve full standing position which is a significant improvement from when this OT saw pt last week. Completed 4x sit<>stand from elevated surface; very motivated, continues to struggle with achieving full knee extension with subsequent trail, fatigues. May benefit from longer rest periods between sit<>stands next session. Pt then able to complete simulated toilet transfer from EOB to drop arm recliner placed on right side with lateral scoots and mod A +2, utilized bed pad and gait belt to facilitate each scoot, mod A +2. Recommend staff use Hoyer for back to bed as pt likely to struggle with coming from lower recliner seat height to higher seat height EOB. Continue to feel pt would benefit from intensive rehab program at time of d/c.   Follow Up Recommendations  CIR    Equipment Recommendations  Other (comment) (bariatric, drop arm 3n1)    Recommendations for Other Services      Precautions / Restrictions Precautions Precautions: Fall;Back Precaution Booklet Issued: No Precaution Comments: verbally reviewed back precautions Required Braces or Orthoses: Spinal Brace Spinal Brace: Lumbar corset;Applied in sitting position Restrictions Weight Bearing Restrictions: No       Mobility Bed Mobility Overal bed mobility: Needs Assistance Bed Mobility: Rolling;Sidelying to Sit Rolling: Mod  assist Sidelying to sit: Mod assist       General bed mobility comments: Mod A to power hips over to full sidelying position then mod A to power up trunk. Pt very motivated throughout. Improvement noted since last week when pt was seen by this OT.    Transfers Overall transfer level: Needs assistance Equipment used: Rolling walker (2 wheeled) Transfers: Sit to/from Stand;Lateral/Scoot Transfers Sit to Stand: Mod assist;+2 physical assistance;From elevated surface        Lateral/Scoot Transfers: Mod assist;+2 physical assistance;From elevated surface General transfer comment: 4x stood from EOB. First time maintained static standing for about 20 seconds. Pt as able to achieve full standing position which is a huge improvement from when this OT saw pt last week. 1-2 minute rest break between trials, motivated throughout fatigued with each attempt (may do better with longer rest interval). Pt then able to complete lateral scoot to drop arm recliner placed on pt's right side. Mod A +2 to steady and provide some boost to each scoot utilizing bed pad underneath pt and gait belt.    Balance Overall balance assessment: Needs assistance Sitting-balance support: Feet supported;Bilateral upper extremity supported Sitting balance-Leahy Scale: Poor Sitting balance - Comments: needs bil UE support in sitting, posterior lean   Standing balance support: Bilateral upper extremity supported Standing balance-Leahy Scale: Poor Standing balance comment: needs external support from RW and two people.                           ADL either performed or assessed with clinical judgement   ADL Overall ADL's : Needs assistance/impaired  Toilet Transfer: Moderate assistance;+2 for physical assistance;BSC;Requires wide/bariatric;Requires drop arm Toilet Transfer Details (indicate cue type and reason): Lateral scoot from EOB to drop arm recliner placed on pt's right  side. Utilized pad and gait belt to stedy and faciliate hips up and over. Pt able to offload onto BUE fairly well.           General ADL Comments: Pt completed bed mobility with mod A, sat EOB a couple of minutes then completed 4x sit<>stand from EOB. Fatigued with each round but very determined to try each time. Difficulty achieving B knee extension. After seated rest break pt successfully completed lateral scoot from EOB to drop arm recliner (simulating toilet transfer to drop arm bariatric 3n1) with +2 mod A.     Vision       Perception     Praxis      Cognition Arousal/Alertness: Awake/alert Behavior During Therapy: WFL for tasks assessed/performed Overall Cognitive Status: Within Functional Limits for tasks assessed                                          Exercises     Shoulder Instructions       General Comments      Pertinent Vitals/ Pain       Pain Assessment: 0-10 Pain Score: 5  Pain Location: low back and back of B LE Pain Descriptors / Indicators: Grimacing;Spasm Pain Intervention(s): Limited activity within patient's tolerance;Monitored during session;Premedicated before session;Repositioned  Home Living                                          Prior Functioning/Environment              Frequency  Min 2X/week        Progress Toward Goals  OT Goals(current goals can now be found in the care plan section)  Progress towards OT goals: Progressing toward goals  Acute Rehab OT Goals Patient Stated Goal: to go home if she can, but realizes she may need rehab, to walk again, return to her PLOF OT Goal Formulation: With patient Time For Goal Achievement: 02/14/21 Potential to Achieve Goals: Good ADL Goals Pt Will Perform Upper Body Bathing: with mod assist;sitting Pt Will Perform Lower Body Bathing: sit to/from stand;sitting/lateral leans;with max assist Pt Will Transfer to Toilet: with max assist;with +2  assist;stand pivot transfer Additional ADL Goal #1: Pt will complete sidelying<>EOB at min A level to prepare for EOB/OOB ADLs. Additional ADL Goal #2: Pt will sit EOB for 2 minutes at min guard level with up to single UE support to faciliate UB ADLs.  Plan Discharge plan remains appropriate    Co-evaluation                 AM-PAC OT "6 Clicks" Daily Activity     Outcome Measure   Help from another person eating meals?: None Help from another person taking care of personal grooming?: A Little Help from another person toileting, which includes using toliet, bedpan, or urinal?: A Lot Help from another person bathing (including washing, rinsing, drying)?: A Lot Help from another person to put on and taking off regular upper body clothing?: A Lot Help from another person to put on and taking off regular lower body clothing?: A Lot  6 Click Score: 15    End of Session Equipment Utilized During Treatment: Gait belt;Rolling walker;Other (comment) (utilized bed pad)  OT Visit Diagnosis: Unsteadiness on feet (R26.81);Other abnormalities of gait and mobility (R26.89);Muscle weakness (generalized) (M62.81);Pain   Activity Tolerance Patient tolerated treatment well   Patient Left in chair;with call bell/phone within reach   Nurse Communication Mobility status;Need for lift equipment        Time: 0928-1010 OT Time Calculation (min): 42 min  Charges: OT General Charges $OT Visit: 1 Visit OT Treatments $Self Care/Home Management : 38-52 mins  Raynald Kemp, OT Acute Rehabilitation Services Pager: [phone] Office: [phone]    Pilar Grammes 02/06/2021, 11:36 AM

## 2021-02-07 DIAGNOSIS — E46 Unspecified protein-calorie malnutrition: Secondary | ICD-10-CM | POA: Diagnosis not present

## 2021-02-07 DIAGNOSIS — Z20822 Contact with and (suspected) exposure to covid-19: Secondary | ICD-10-CM | POA: Diagnosis not present

## 2021-02-07 DIAGNOSIS — E785 Hyperlipidemia, unspecified: Secondary | ICD-10-CM | POA: Diagnosis not present

## 2021-02-07 DIAGNOSIS — E7800 Pure hypercholesterolemia, unspecified: Secondary | ICD-10-CM | POA: Diagnosis not present

## 2021-02-07 DIAGNOSIS — Z8249 Family history of ischemic heart disease and other diseases of the circulatory system: Secondary | ICD-10-CM | POA: Diagnosis not present

## 2021-02-07 DIAGNOSIS — N289 Disorder of kidney and ureter, unspecified: Secondary | ICD-10-CM | POA: Diagnosis not present

## 2021-02-07 DIAGNOSIS — Z452 Encounter for adjustment and management of vascular access device: Secondary | ICD-10-CM | POA: Diagnosis not present

## 2021-02-07 DIAGNOSIS — T82594A Other mechanical complication of infusion catheter, initial encounter: Secondary | ICD-10-CM | POA: Diagnosis not present

## 2021-02-07 DIAGNOSIS — M47816 Spondylosis without myelopathy or radiculopathy, lumbar region: Secondary | ICD-10-CM | POA: Diagnosis not present

## 2021-02-07 DIAGNOSIS — B9620 Unspecified Escherichia coli [E. coli] as the cause of diseases classified elsewhere: Secondary | ICD-10-CM | POA: Diagnosis not present

## 2021-02-07 DIAGNOSIS — R52 Pain, unspecified: Secondary | ICD-10-CM | POA: Diagnosis not present

## 2021-02-07 DIAGNOSIS — Z79899 Other long term (current) drug therapy: Secondary | ICD-10-CM | POA: Diagnosis not present

## 2021-02-07 DIAGNOSIS — M5127 Other intervertebral disc displacement, lumbosacral region: Secondary | ICD-10-CM | POA: Diagnosis not present

## 2021-02-07 DIAGNOSIS — R262 Difficulty in walking, not elsewhere classified: Secondary | ICD-10-CM | POA: Diagnosis not present

## 2021-02-07 DIAGNOSIS — M5126 Other intervertebral disc displacement, lumbar region: Secondary | ICD-10-CM | POA: Diagnosis not present

## 2021-02-07 DIAGNOSIS — M549 Dorsalgia, unspecified: Secondary | ICD-10-CM | POA: Diagnosis not present

## 2021-02-07 DIAGNOSIS — G062 Extradural and subdural abscess, unspecified: Secondary | ICD-10-CM | POA: Diagnosis not present

## 2021-02-07 DIAGNOSIS — M869 Osteomyelitis, unspecified: Secondary | ICD-10-CM | POA: Diagnosis not present

## 2021-02-07 DIAGNOSIS — E039 Hypothyroidism, unspecified: Secondary | ICD-10-CM | POA: Diagnosis not present

## 2021-02-07 DIAGNOSIS — M4646 Discitis, unspecified, lumbar region: Secondary | ICD-10-CM | POA: Diagnosis present

## 2021-02-07 DIAGNOSIS — R5381 Other malaise: Secondary | ICD-10-CM | POA: Diagnosis not present

## 2021-02-07 DIAGNOSIS — T82898A Other specified complication of vascular prosthetic devices, implants and grafts, initial encounter: Secondary | ICD-10-CM | POA: Diagnosis present

## 2021-02-07 DIAGNOSIS — Z7982 Long term (current) use of aspirin: Secondary | ICD-10-CM | POA: Diagnosis not present

## 2021-02-07 DIAGNOSIS — N130 Hydronephrosis with ureteropelvic junction obstruction: Secondary | ICD-10-CM | POA: Diagnosis not present

## 2021-02-07 DIAGNOSIS — R0902 Hypoxemia: Secondary | ICD-10-CM | POA: Diagnosis not present

## 2021-02-07 DIAGNOSIS — M48061 Spinal stenosis, lumbar region without neurogenic claudication: Secondary | ICD-10-CM | POA: Diagnosis not present

## 2021-02-07 DIAGNOSIS — R279 Unspecified lack of coordination: Secondary | ICD-10-CM | POA: Diagnosis not present

## 2021-02-07 DIAGNOSIS — Z5181 Encounter for therapeutic drug level monitoring: Secondary | ICD-10-CM | POA: Diagnosis not present

## 2021-02-07 DIAGNOSIS — N3289 Other specified disorders of bladder: Secondary | ICD-10-CM | POA: Diagnosis not present

## 2021-02-07 DIAGNOSIS — I517 Cardiomegaly: Secondary | ICD-10-CM | POA: Diagnosis not present

## 2021-02-07 DIAGNOSIS — X58XXXA Exposure to other specified factors, initial encounter: Secondary | ICD-10-CM | POA: Diagnosis not present

## 2021-02-07 DIAGNOSIS — Z743 Need for continuous supervision: Secondary | ICD-10-CM | POA: Diagnosis not present

## 2021-02-07 DIAGNOSIS — D649 Anemia, unspecified: Secondary | ICD-10-CM | POA: Diagnosis not present

## 2021-02-07 DIAGNOSIS — N201 Calculus of ureter: Secondary | ICD-10-CM | POA: Diagnosis not present

## 2021-02-07 DIAGNOSIS — M4626 Osteomyelitis of vertebra, lumbar region: Secondary | ICD-10-CM | POA: Diagnosis not present

## 2021-02-07 DIAGNOSIS — R531 Weakness: Secondary | ICD-10-CM | POA: Diagnosis not present

## 2021-02-07 DIAGNOSIS — G061 Intraspinal abscess and granuloma: Secondary | ICD-10-CM | POA: Diagnosis present

## 2021-02-07 DIAGNOSIS — D696 Thrombocytopenia, unspecified: Secondary | ICD-10-CM | POA: Diagnosis not present

## 2021-02-07 DIAGNOSIS — I10 Essential (primary) hypertension: Secondary | ICD-10-CM | POA: Diagnosis not present

## 2021-02-07 DIAGNOSIS — D72829 Elevated white blood cell count, unspecified: Secondary | ICD-10-CM | POA: Diagnosis not present

## 2021-02-07 DIAGNOSIS — G8929 Other chronic pain: Secondary | ICD-10-CM | POA: Diagnosis not present

## 2021-02-07 LAB — RESP PANEL BY RT-PCR (FLU A&B, COVID) ARPGX2
Influenza A by PCR: NEGATIVE
Influenza B by PCR: NEGATIVE
SARS Coronavirus 2 by RT PCR: NEGATIVE

## 2021-02-07 LAB — GLUCOSE, CAPILLARY: Glucose-Capillary: 105 mg/dL — ABNORMAL HIGH (ref 70–99)

## 2021-02-07 MED ORDER — OXYCODONE HCL 5 MG PO TABS: 5.0000 mg | ORAL_TABLET | Freq: Four times a day (QID) | ORAL | 0 refills | Status: DC | PRN

## 2021-02-07 MED ORDER — CEFAZOLIN IV (FOR PTA / DISCHARGE USE ONLY): 2.0000 g | Freq: Three times a day (TID) | INTRAVENOUS | 0 refills | Status: AC

## 2021-02-07 MED ORDER — HEPARIN SOD (PORK) LOCK FLUSH 100 UNIT/ML IV SOLN
250.0000 [IU] | INTRAVENOUS | Status: AC | PRN
Start: 2021-02-07 — End: 2021-02-07
  Administered 2021-02-07: 250 [IU]
  Filled 2021-02-07: qty 2.5

## 2021-02-07 NOTE — TOC Transition Note (Signed)
Transition of Care [facility]) - CM/SW Discharge Note   Patient Details  Name: Cassidy Austin MRN: [id number] Date of Birth: 12/07/1950  Transition of Care [facility]) CM/SW Contact:  Erin Sons, LCSW Phone Number: 02/07/2021, 2:02 PM   Clinical Narrative:     Pt received Berkley Harvey GUY#[id number]. AuthID: [id number]. Approved 4/6 -- 4/8  Patient will DC to: [facility] Anticipated DC date: 02/07/21 Transport by: Sharin Mons   Per MD patient ready for DC to [facility]. RN, patient, patient's family, and facility notified of DC. Discharge Summary and FL2 sent to facility. RN to call report prior to discharge [phone]). DC packet on chart. Ambulance transport requested for patient.   CSW will sign off for now as social work intervention is no longer needed. Please consult [place] again if new needs arise.     Final next level of care: Skilled Nursing Facility Barriers to Discharge: No Barriers Identified   Patient Goals and CMS Choice Patient states their goals for this hospitalization and ongoing recovery are:: SNF at [facility].gov Compare Post Acute Care list provided to:: Patient Choice offered to / list presented to : Patient  Discharge Placement              Patient chooses bed at: [facility] Patient to be transferred to facility by: [facility]   Patient and family notified of of transfer: 02/07/21  Discharge Plan and Services   Discharge Planning Services: CM Consult            DME Arranged: Other see comment ([facility]) DME Agency: Other - Comment Date DME Agency Contacted: 01/30/21 Time DME Agency Contacted: [phone] Representative spoke with at DME Agency: Pam HH Arranged: RN HH Agency: [facility] Date [facility] Agency Contacted: 01/30/21 Time HH Agency Contacted: [phone] Representative spoke with at [facility] Agency: Kandee Keen  Social Determinants of Health (SDOH) Interventions     Readmission Risk Interventions No  flowsheet data found.

## 2021-02-07 NOTE — Plan of Care (Signed)
Patient discharged to SNF. Alert and oriented x4. Medicated for pain, and received all scheduled night time medications. [facility] providing transport.

## 2021-02-07 NOTE — Progress Notes (Signed)
Physical Therapy Treatment Patient Details Name: Cassidy Austin MRN: [id number] DOB: [birth year] Today's Date: 02/07/2021    History of Present Illness 70 y.o. female admitted from [facility] on 01/27/21 for back pain.  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  Pt s/p CT guided aspiration of R psoas fluid collection and disc space L1-2.  Pt with significant PMH of recent hospitalization 1/31-12/13/20 for pylonephritis and NSTEMI d/c to SNF for rehab, HTN. LSO when OOB.    PT Comments    Pt required +2 assistance to boost into standing in efforts to get dressed for transfer to rehab.  Pt in standing reports need for BM so then transferred to commode. Pt left sitting on commode with call bell in reach.     Follow Up Recommendations  CIR     Equipment Recommendations  Wheelchair cushion (measurements PT);Wheelchair (measurements PT);Hospital bed;Other (comment) (hoyer lift)    Recommendations for Other Services Rehab consult     Precautions / Restrictions Precautions Precautions: Fall;Back Precaution Booklet Issued: No Precaution Comments: verbally reviewed back precautions Required Braces or Orthoses: Spinal Brace Spinal Brace: Lumbar corset;Applied in sitting position Restrictions Weight Bearing Restrictions: No    Mobility  Bed Mobility               General bed mobility comments: Seated in recliner on arrival.    Transfers Overall transfer level: Needs assistance Equipment used: Ambulation equipment used (sara stedy for sit to stand) Transfers: Sit to/from Stand Sit to Stand: +2 physical assistance;Max assist (use of bed pad to boost into standing.)         General transfer comment: Cues for hand placement to and from seated surface to hold to stedy cross bar.  Performed sit to stand from recliner and from stedy plates.  Ambulation/Gait Ambulation/Gait assistance:  (NT)               Stairs             Wheelchair  Mobility    Modified Rankin (Stroke Patients Only)       Balance Overall balance assessment: Needs assistance Sitting-balance support: Feet supported;Bilateral upper extremity supported Sitting balance-Leahy Scale: Fair Sitting balance - Comments: at edge of recliner.     Standing balance-Leahy Scale: Poor Standing balance comment: needs external support from stedy and two people.                            Cognition Arousal/Alertness: Awake/alert Behavior During Therapy: WFL for tasks assessed/performed Overall Cognitive Status: Within Functional Limits for tasks assessed                                        Exercises      General Comments        Pertinent Vitals/Pain Pain Assessment: Faces Faces Pain Scale: Hurts a little bit Pain Location: low back and back of B LE Pain Descriptors / Indicators: Discomfort Pain Intervention(s): Monitored during session;Repositioned    Home Living                      Prior Function            PT Goals (current goals can now be found in the care plan section) Acute Rehab PT Goals Patient Stated Goal: to get dressed and go to rehab. Potential to  Achieve Goals: Good Progress towards PT goals: Progressing toward goals    Frequency    Min 3X/week      PT Plan Current plan remains appropriate    Co-evaluation              AM-PAC PT "6 Clicks" Mobility   Outcome Measure  Help needed turning from your back to your side while in a flat bed without using bedrails?: A Lot Help needed moving from lying on your back to sitting on the side of a flat bed without using bedrails?: A Lot Help needed moving to and from a bed to a chair (including a wheelchair)?: A Lot Help needed standing up from a chair using your arms (e.g., wheelchair or bedside chair)?: A Lot   Help needed climbing 3-5 steps with a railing? : Total 6 Click Score: 9    End of Session Equipment Utilized During  Treatment: Gait belt;Back brace Activity Tolerance: Patient limited by pain;Patient limited by fatigue Patient left: in chair;with call bell/phone within reach Nurse Communication: Mobility status;Need for lift equipment PT Visit Diagnosis: Muscle weakness (generalized) (M62.81);Difficulty in walking, not elsewhere classified (R26.2);Other symptoms and signs involving the nervous system (R29.898)     Time: 3335-4562 PT Time Calculation (min) (ACUTE ONLY): 23 min  Charges:  $Therapeutic Activity: 23-37 mins                     Cassidy Austin , PTA Acute Rehabilitation Services Pager [phone] Office [phone]     Cassidy Austin Artis Delay 02/07/2021, 3:55 PM

## 2021-02-07 NOTE — Plan of Care (Signed)
  Problem: Activity: Goal: Risk for activity intolerance will decrease Outcome: Progressing   Problem: Pain Managment: Goal: General experience of comfort will improve Outcome: Progressing   

## 2021-02-07 NOTE — TOC Progression Note (Signed)
Transition of Care [facility]) - Progression Note    Patient Details  Name: Oliviah Agostini MRN: [id number] Date of Birth: December 03, 1950  Transition of Care [facility]) CM/SW Contact  Erin Sons, [place] Phone Number: 02/07/2021, 10:04 AM  Clinical Narrative:     [facility] can now offer. Pt chooses [facility]. CSW notified Kim at [employer]. CSW started [place] and submitted clinicals to [place]. [phone]#[id number]  Expected Discharge Plan: Skilled Nursing Facility (vs home health services) Barriers to Discharge: Continued Medical Work up  Expected Discharge Plan and Services Expected Discharge Plan: Skilled Nursing Facility (vs home health services)   Discharge Planning Services: CM Consult   Living arrangements for the past 2 months: Skilled Nursing Facility                 DME Arranged: Other see comment ([facility]) DME Agency: Other - Comment Date DME Agency Contacted: 01/30/21 Time DME Agency Contacted: [phone] Representative spoke with at DME Agency: [facility] HH Arranged: RN HH Agency: [facility] Date [facility] Agency Contacted: 01/30/21 Time HH Agency Contacted: [phone] Representative spoke with at [facility] Agency: Kandee Keen   Social Determinants of Health (SDOH) Interventions    Readmission Risk Interventions No flowsheet data found.

## 2021-02-07 NOTE — Progress Notes (Addendum)
RN attempted to call report to [facility], voicemail left with call back number.  1600: Report called to [facility]. All questions answered. Pt belongings gathered to be sent with her. Waiting on PTAR for transport.

## 2021-02-07 NOTE — Discharge Summary (Signed)
Physician Discharge Summary  Cassidy Austin DHR:[id number] DOB: [birth year] DOA: 01/27/2021  PCP: Harlan Stains, MD  Admit date: 01/27/2021 Discharge date: 02/07/2021  Admitted From: Home Disposition:  SNF  Discharge Condition:Stable CODE STATUS:FULL Diet recommendation: Heart Healthy    Brief/Interim Summary: Patient is a 70 year old female with history of hypertension, nephrolithiasis, hypothyroidism, chronic back pain, recent history of sepsis with pyelonephritis who was admitted after outpatient MRI of the lumbar spine showed discitis/osteomyelitis.  Underwent CT-guided aspiration of paraspinal area and days at L1-L2. PT/OT recommended CIR but she was declined. plan is to  discharge to skilled nursing facility and continue IV antibiotics till 03/14/21 .  Patient is medically stable for discharge to skilled nursing facility .  Following problems were addressed during her hospitalization:   L1-L2 discitis/osteomyelitis: Presented with intractable low back pain, not able to ambulate.  MRI done as an outpatient showed discitis/osteomyelitis of L1-L2 region.  IR guided aspiration of the infected area on March 27, culture positive for pansensitive E. coli.  Blood cultures have remained negative.  PICC line placed on 3/30.  ID was following.  Plan is to continue IV antibiotics, cefazolin, till 03/14/2021.  She will follow-up with ID on 4/14 as an outpatient  History of nephrolithiasis:5 mm nonobstructive ureteric calculi & 3 mm right VUJcalculi that is probably obstructive noted on CT 12/11/20  She was evaluated by urology Dr. Louis Meckel during last hospitalization recommended outpatient follow-up, patient report has not follow-up with urology yet.  We recommend urology evaluation as an outpatient.  Continue Flomax.  Ultrasound done here did not show any hydronephrosis.  Normocytic anemia: Iron panel not consistent with iron deficiency or vitamin B12 deficiency, FOBT negative.  Continue monitoring.   Hemoglobin in the range of 8.  Hypertension:On Coreg, irbesartan, hydrochlorothiazide.  She has been noted to be hypertensive since yesterday.  Continue to monitor  Hypothyroidism: Continue Synthyroid  Constipation: Continue bowel regimen  Mild left common iliac lymphadenopathy: Nonspecific finding on CT from 12/11/2020.  Recommend follow-up by CT in 3 months  Debility/deconditioning: PT/OT recommended CIR, insurance declined.  Plan is for skilled nursing facility.   Discharge Diagnoses:  Principal Problem:   Discitis/osteomyelitis of lumbar region Active Problems:   Hypothyroidism   Hypertension   Intractable pain   Impaired ambulation   Protein-calorie malnutrition, severe    Discharge Instructions  Discharge Instructions    [facility] pharmacist to adjust dose for Vancomycin, Aminoglycosides and other anti-infective therapies as requested by physician.   Complete by: As directed    [facility] to provide Cath Flo 32m   Complete by: As directed    Administer for PICC line occlusion and as ordered by physician for other access device issues.   Anaphylaxis Kit: Provided to treat any anaphylactic reaction to the medication being provided to the patient if First Dose or when requested by physician   Complete by: As directed    Epinephrine 170mml vial / amp: Administer 0.70m570m0.70ml35mubcutaneously once for moderate to severe anaphylaxis, nurse to call physician and pharmacy when reaction occurs and call 911 if needed for immediate care   Diphenhydramine 50mg4mIV vial: Administer 25-50mg 44mM PRN for first dose reaction, rash, itching, mild reaction, nurse to call physician and pharmacy when reaction occurs   Sodium Chloride 0.9% NS 500ml I71mdminister if needed for hypovolemic blood pressure drop or as ordered by physician after call to physician with anaphylactic reaction   Change dressing on IV access line weekly and PRN  Complete by: As directed     Diet - low sodium heart healthy   Complete by: As directed    Discharge instructions   Complete by: As directed    1)Please take prescribed medications as instructed 2)Follow up with ID and urology as an outpatient.You have an appointment with ID on 02/15/2021.  Name and number of the providers have been attached   Flush IV access with Sodium Chloride 0.9% and Heparin 10 units/ml or 100 units/ml   Complete by: As directed    Home infusion instructions - [facility]   Complete by: As directed    Instructions: Flush IV access with Sodium Chloride 0.9% and Heparin 10units/ml or 100units/ml   Change dressing on IV access line: Weekly and PRN   Instructions Cath Flo 45m: Administer for PICC Line occlusion and as ordered by physician for other access device   [facility] pharmacist to adjust dose for: Vancomycin, Aminoglycosides and other anti-infective therapies as requested by physician   Increase activity slowly   Complete by: As directed    Method of administration may be changed at the discretion of home infusion pharmacist based upon assessment of the patient and/or caregiver's ability to self-administer the medication ordered   Complete by: As directed    No wound care   Complete by: As directed      Allergies as of 02/07/2021   No Known Allergies     Medication List    STOP taking these medications   traMADol 50 MG tablet Commonly known as: ULTRAM     TAKE these medications   Aspercreme Lidocaine 4 % Ptch Generic drug: Lidocaine Apply 1 patch topically daily.   aspirin 81 MG chewable tablet Chew 81 mg by mouth every morning.   carvedilol 12.5 MG tablet Commonly known as: COREG Take 12.5 mg by mouth 2 (two) times daily.   ceFAZolin  IVPB Commonly known as: ANCEF Inject 2 g into the vein every 8 (eight) hours. Indication:  osteomyelitis First Dose: No Last Day of Therapy:  03/14/2021 Labs - Once weekly:  CBC/D and BMP, Labs - Every other week:   ESR and CRP Method of administration: IV Push Method of administration may be changed at the discretion of home infusion pharmacist based upon assessment of the patient and/or caregiver's ability to self-administer the medication ordered.   cholecalciferol 1000 units tablet Commonly known as: VITAMIN D Take 1,000 Units by mouth 2 (two) times daily.   cyclobenzaprine 5 MG tablet Commonly known as: FLEXERIL Take 5 mg by mouth 3 (three) times daily as needed for muscle spasms.   Dermacloud Oint Apply 1 application topically daily.   ezetimibe 10 MG tablet Commonly known as: ZETIA Take 10 mg by mouth daily.   gabapentin 100 MG capsule Commonly known as: NEURONTIN Take 1 capsule (100 mg total) by mouth 3 (three) times daily.   levothyroxine 100 MCG tablet Commonly known as: SYNTHROID Take 100 mcg by mouth daily at 6 (six) AM.   Magnesium 250 MG Tabs Take 500 mg by mouth every morning.   Minerin Creme Crea Apply 1 application topically daily.   olmesartan-hydrochlorothiazide 40-25 MG tablet Commonly known as: BENICAR HCT Take 1 tablet by mouth daily.   oxyCODONE 5 MG immediate release tablet Commonly known as: Roxicodone Take 1 tablet (5 mg total) by mouth every 6 (six) hours as needed for severe pain.   tamsulosin 0.4 MG Caps capsule Commonly known as: FLOMAX Take 1 capsule (0.4 mg total)  by mouth daily after breakfast.   tetrahydrozoline 0.05 % ophthalmic solution Place 1 drop into both eyes daily as needed (dry eyes).   tiZANidine 2 MG tablet Commonly known as: ZANAFLEX Take 1 tablet (2 mg total) by mouth every 8 (eight) hours as needed for muscle spasms.   Turmeric 500 MG Caps Take 1 capsule by mouth 2 (two) times daily.   vitamin C 500 MG tablet Commonly known as: ASCORBIC ACID Take 500 mg by mouth daily.            Discharge Care Instructions  (From admission, onward)         Start     Ordered   02/07/21 0000  Change dressing on IV access line  weekly and PRN  (Home infusion instructions - [facility] )        02/07/21 1314          Follow-up Information    [facility] Follow up.   Why: [facility] will provide IV antibiotic therapy       [facility], [facility] Follow up.   Specialty: Home Health Services Why: Home health RN will be provided by [facility], start of care will be within 24 hours post discharge Contact information: [place] [address] [phone]        Rosiland Oz, MD Follow up on 02/15/2021.   Specialty: Infectious Diseases Contact information: [address] [place] [address] [phone]        Ardis Hughs, MD Follow up.   Specialty: Urology Why: right side hydronephrosis and right side kidney stone Contact information: [address] [place] [address] [phone]              No Known Allergies  Consultations:  IR,ID   Procedures/Studies: MR LUMBAR SPINE WO CONTRAST  Result Date: 01/27/2021 CLINICAL DATA:  Lumbar radiculopathy; fall in January EXAM: MRI LUMBAR SPINE WITHOUT CONTRAST TECHNIQUE: Multiplanar, multisequence MR imaging of the lumbar spine was performed. No intravenous contrast was administered. COMPARISON:  Correlation made with CT lumbar spine 12/11/2020 FINDINGS: Segmentation:  Standard. Alignment:  Mild anterolisthesis at L3-L4 and L4-L5. Vertebrae: There is new irregularity and erosion at the L1-L2 opposing endplates with abnormal marrow signal. Vertebral body heights are otherwise maintained. Background diffusely decreased T1 marrow signal may reflect anemia. Conus medullaris and cauda equina: Conus extends to the approximately L1 level. Cauda equina unremarkable apart from level of stenosis. Paraspinal and other soft tissues: Paraspinal edema centered at L1 and L2 with involvement of the psoas muscles. Presence of intradiscal abscess extending into the right psoas is not  excluded. Disc levels: T12-L1: Mild facet arthropathy.  No stenosis. L1-L2: T2 hyperintense soft tissue extends from the disc into the ventral epidural space and slightly below disc level. Moderate facet arthropathy with ligamentum flavum infolding. Marked canal stenosis. Moderate to marked foraminal stenosis. L2-L3: Disc bulge. Moderate facet arthropathy with ligamentum flavum infolding. Minor canal stenosis. Minor right and mild left foraminal stenosis. L3-L4: Anterolisthesis with uncovering of disc bulge. Marked facet arthropathy with ligamentum flavum infolding. Moderate to marked canal stenosis. Mild to moderate foraminal stenosis. L4-L5: Anterolisthesis with uncovering of disc bulge. Marked facet arthropathy with ligamentum flavum infolding. Moderate canal stenosis. Mild foraminal stenosis. L5-S1: Marked right and mild left facet arthropathy. No canal or right foraminal stenosis. Mild left foraminal stenosis. 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 Intradiscal abscess extending into the right psoas is  not excluded. 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 Multilevel degenerative changes at other levels. Degenerative canal narrowing greatest at L3-L4. These results will be called to the ordering clinician or representative by the Radiologist Assistant, and communication documented in the PACS or [employer]. Electronically Signed   By: Macy Mis M.D.   On: 01/27/2021 11:56   US RENAL  Result Date: 01/31/2021 CLINICAL DATA:  Hydronephrosis. EXAM: RENAL / URINARY TRACT ULTRASOUND COMPLETE COMPARISON:  Included portion from disc aspiration 01/28/2021. Abdominal CT 12/11/2020, lumbar MRI 01/26/2021 FINDINGS: Right Kidney: Renal measurements: 11 x 4.2 x 5.1 cm = volume: 124 mL. Hydronephrosis on prior CT has resolved. There is mild increased renal parenchymal echogenicity. Hypoechoic 2.7 x 2.4 x 2.3 cm area in  the superior kidney likely represent cyst when compared with prior MRI, but is not well assessed on the current exam due to habitus. Left Kidney: Renal measurements: 10.3 x 5.3 x 4.7 cm = volume: 133 mL. No hydronephrosis. There is a 2.2 cm parapelvic cyst in the lower kidney. No obvious solid lesion. 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 There is mild bladder wall thickening. Other: None. IMPRESSION: 1. No hydronephrosis. 2. Known hypoechoic area in the superior right kidney likely represents a cyst, but is not well-defined on the current exam. There is a parapelvic cyst in the lower left kidney. 3. 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 Electronically Signed   By: Keith Rake M.D.   On: 01/31/2021 21:37   CT ASPIRATION  Result Date: 01/28/2021 INDICATION: 70 year old with evidence for discitis at L1-L2. Request for fluid sampling. EXAM: CT-GUIDED ASPIRATION OF THE L1-L2 DISC SPACE AND L1-L2 PARASPINAL SOFT TISSUE MEDICATIONS: Moderate sedation ANESTHESIA/SEDATION: Fentanyl 50 mcg IV; Versed 1.0 mg IV Moderate Sedation Time:  35 minutes The patient was continuously monitored during the procedure by the interventional radiology nurse under my direct supervision. COMPLICATIONS: None immediate. PROCEDURE: Informed written consent was obtained from the patient after a thorough discussion of the procedural risks, benefits and alternatives. All questions were addressed. A timeout was performed prior to the initiation of the procedure. Patient was placed prone on the CT scanner. Images through the thoracolumbar spine were obtained. L1-L2 disc space was identified. The back was prepped with chlorhexidine and sterile field was created. Right paraspinal region was anesthetized using 1% lidocaine. Using CT guidance, 18 gauge trocar needle was directed into the right paraspinal region at L1-L2 into the right psoas muscle. Only a small amount of bloody fluid could be  obtained after the needle was withdrawn under suction. This fluid was combined with a small amount of sterile saline. Attention was directed to placing a needle in the disc space along the left side. Additional local anesthetic was used. A new 18 gauge trocar needle was directed into the lateral aspect of the left L1-L2 disc space using CT guidance. Needle position confirmed along the lateral aspect of the disc space. Only a small amount of bloody fluid could be aspirated as the needle was withdrawn under suction. Both of these samples were sent for culture. Bandages were placed at the puncture sites. FINDINGS: Again noted is marked irregularity of the L1-L2 disc space. 18 gauge needle was directed along the right psoas muscle at L1-L2 and a small amount of bloody fluid was removed. 18 gauge needle was directed into the left lateral aspect of the L1-L2 disc space and only a small amount of bloody fluid was obtained.  IMPRESSION: CT-guided aspiration the L1-L2 disc space and right paraspinal tissue. Fluid was sent for culture. Electronically Signed   By: Markus Daft M.D.   On: 01/28/2021 13:35   [place] EKG SITE RITE  Result Date: 01/31/2021 If Site Rite image not attached, placement could not be confirmed due to current cardiac rhythm.  [place] EKG SITE RITE  Result Date: 01/30/2021 If Site Rite image not attached, placement could not be confirmed due to current cardiac rhythm.     Subjective:  Patient seen and examined at the bedside this morning.  Hemodynamically stable for discharge. Discharge Exam: Vitals:   02/07/21 0541 02/07/21 0722  BP: 133/65 137/65  Pulse: 69 66  Resp: 16 17  Temp: 98.8 F (37.1 C) 98.7 F (37.1 C)  SpO2: 100% 99%   Vitals:   02/06/21 1503 02/06/21 2029 02/07/21 0541 02/07/21 0722  BP: 121/77 127/63 133/65 137/65  Pulse: 84 73 69 66  Resp: _0 Temp: 98.3 F (36.8 C) 97.6 F (36.4 C) 98.8 F (37.1 C) 98.7 F (37.1 C)  TempSrc: Oral Oral Oral Oral  SpO2:  99% 97% 100% 99%  Weight:      Height:        General: Pt is alert, awake, not in acute distress Cardiovascular: RRR, S1/S2 +, no rubs, no gallops Respiratory: CTA bilaterally, no wheezing, no rhonchi Abdominal: Soft, NT, ND, bowel sounds + Extremities: no edema, no cyanosis    The results of significant diagnostics from this hospitalization (including imaging, microbiology, ancillary and laboratory) are listed below for reference.     Microbiology: Recent Results (from the past 240 hour(s))  Resp Panel by RT-PCR (Flu A&B, Covid) Nasopharyngeal Swab     Status: None   Collection Time: 02/07/21  8:52 AM   Specimen: Nasopharyngeal Swab; Nasopharyngeal(NP) swabs in vial transport medium  Result Value Ref Range Status   SARS Coronavirus 2 by RT PCR NEGATIVE NEGATIVE Final    Comment: (NOTE) SARS-CoV-2 target nucleic acids are NOT DETECTED.  The SARS-CoV-2 RNA is generally detectable in upper respiratory specimens during the acute phase of infection. The lowest concentration of SARS-CoV-2 viral copies this assay can detect is 138 copies/mL. A negative result does not preclude SARS-Cov-2 infection and should not be used as the sole basis for treatment or other patient management decisions. A negative result may occur with  improper specimen collection/handling, submission of specimen other than nasopharyngeal swab, presence of viral mutation(s) within the areas targeted by this assay, and inadequate number of viral copies(<138 copies/mL). A negative result must be combined with clinical observations, patient history, and epidemiological information. The expected result is Negative.  Fact Sheet for Patients:  EntrepreneurPulse.com.au  Fact Sheet for Healthcare Providers:  IncredibleEmployment.be  This test is no t yet approved or cleared by the [place] FDA and  has been authorized for detection and/or diagnosis of SARS-CoV-2 by FDA  under an Emergency Use Authorization (EUA). This EUA will remain  in effect (meaning this test can be used) for the duration of the COVID-19 declaration under Section 564(b)(1) of the Act, 21 U.S.C.section 360bbb-3(b)(1), unless the authorization is terminated  or revoked sooner.       Influenza A by PCR NEGATIVE NEGATIVE Final   Influenza B by PCR NEGATIVE NEGATIVE Final    Comment: (NOTE) The Xpert Xpress SARS-CoV-2/FLU/RSV plus assay is intended as an aid in the diagnosis of influenza from Nasopharyngeal swab specimens and should not be used as a sole basis  for treatment. Nasal washings and aspirates are unacceptable for Xpert Xpress SARS-CoV-2/FLU/RSV testing.  Fact Sheet for Patients: EntrepreneurPulse.com.au  Fact Sheet for Healthcare Providers: IncredibleEmployment.be  This test is not yet approved or cleared by the [place] FDA and has been authorized for detection and/or diagnosis of SARS-CoV-2 by FDA under an Emergency Use Authorization (EUA). This EUA will remain in effect (meaning this test can be used) for the duration of the COVID-19 declaration under Section 564(b)(1) of the Act, 21 U.S.C. section 360bbb-3(b)(1), unless the authorization is terminated or revoked.  Performed at [facility], [place] [address], [place], [place] [address]      Labs: BNP (last 3 results) No results for input(s): BNP in the last 8760 hours. Basic Metabolic Panel: Recent Labs  Lab 02/01/21 0242 02/04/21 0334 02/06/21 0401  NA 139 140 138  K 3.7 3.6 3.5  CL 107 106 103  CO2 _0 GLUCOSE 107* 103* 87  BUN 11 7* 5*  CREATININE 0.74 0.72 0.67  CALCIUM 8.5* 8.6* 8.7*  MG 1.9 2.0 1.9   Liver Function Tests: No results for input(s): AST, ALT, ALKPHOS, BILITOT, PROT, ALBUMIN in the last 168 hours. No results for input(s): LIPASE, AMYLASE in the last 168 hours. No results for input(s): AMMONIA in the last 168  hours. CBC: Recent Labs  Lab 02/01/21 0242 02/04/21 0334 02/06/21 0401  WBC 7.4 7.3 7.6  NEUTROABS 4.7 4.2  --   HGB 8.2* 8.2* 8.4*  HCT 26.0* 26.0* 26.3*  MCV 88.4 87.8 87.7  PLT 244 259 277   Cardiac Enzymes: No results for input(s): CKTOTAL, CKMB, CKMBINDEX, TROPONINI in the last 168 hours. BNP: Invalid input(s): POCBNP CBG: Recent Labs  Lab 02/07/21 0557  GLUCAP 105*   D-Dimer No results for input(s): DDIMER in the last 72 hours. Hgb A1c No results for input(s): HGBA1C in the last 72 hours. Lipid Profile No results for input(s): CHOL, HDL, LDLCALC, TRIG, CHOLHDL, LDLDIRECT in the last 72 hours. Thyroid function studies No results for input(s): TSH, T4TOTAL, T3FREE, THYROIDAB in the last 72 hours.  Invalid input(s): FREET3 Anemia work up No results for input(s): VITAMINB12, FOLATE, FERRITIN, TIBC, IRON, RETICCTPCT in the last 72 hours. Urinalysis    Component Value Date/Time   COLORURINE YELLOW 01/28/2021 0457   APPEARANCEUR CLOUDY (A) 01/28/2021 0457   LABSPEC 1.015 01/28/2021 0457   PHURINE 7.5 01/28/2021 0457   GLUCOSEU NEGATIVE 01/28/2021 0457   HGBUR NEGATIVE 01/28/2021 0457   BILIRUBINUR NEGATIVE 01/28/2021 [place] 01/28/2021 0457   PROTEINUR NEGATIVE 01/28/2021 0457   NITRITE NEGATIVE 01/28/2021 0457   LEUKOCYTESUR TRACE (A) 01/28/2021 0457   Sepsis Labs Invalid input(s): PROCALCITONIN,  WBC,  LACTICIDVEN Microbiology Recent Results (from the past 240 hour(s))  Resp Panel by RT-PCR (Flu A&B, Covid) Nasopharyngeal Swab     Status: None   Collection Time: 02/07/21  8:52 AM   Specimen: Nasopharyngeal Swab; Nasopharyngeal(NP) swabs in vial transport medium  Result Value Ref Range Status   SARS Coronavirus 2 by RT PCR NEGATIVE NEGATIVE Final    Comment: (NOTE) SARS-CoV-2 target nucleic acids are NOT DETECTED.  The SARS-CoV-2 RNA is generally detectable in upper respiratory specimens during the acute phase of infection. The  lowest concentration of SARS-CoV-2 viral copies this assay can detect is 138 copies/mL. A negative result does not preclude SARS-Cov-2 infection and should not be used as the sole basis for treatment or other patient management decisions. A negative result may  occur with  improper specimen collection/handling, submission of specimen other than nasopharyngeal swab, presence of viral mutation(s) within the areas targeted by this assay, and inadequate number of viral copies(<138 copies/mL). A negative result must be combined with clinical observations, patient history, and epidemiological information. The expected result is Negative.  Fact Sheet for Patients:  EntrepreneurPulse.com.au  Fact Sheet for Healthcare Providers:  IncredibleEmployment.be  This test is no t yet approved or cleared by the [place] FDA and  has been authorized for detection and/or diagnosis of SARS-CoV-2 by FDA under an Emergency Use Authorization (EUA). This EUA will remain  in effect (meaning this test can be used) for the duration of the COVID-19 declaration under Section 564(b)(1) of the Act, 21 U.S.C.section 360bbb-3(b)(1), unless the authorization is terminated  or revoked sooner.       Influenza A by PCR NEGATIVE NEGATIVE Final   Influenza B by PCR NEGATIVE NEGATIVE Final    Comment: (NOTE) The Xpert Xpress SARS-CoV-2/FLU/RSV plus assay is intended as an aid in the diagnosis of influenza from Nasopharyngeal swab specimens and should not be used as a sole basis for treatment. Nasal washings and aspirates are unacceptable for Xpert Xpress SARS-CoV-2/FLU/RSV testing.  Fact Sheet for Patients: EntrepreneurPulse.com.au  Fact Sheet for Healthcare Providers: IncredibleEmployment.be  This test is not yet approved or cleared by the [place] FDA and has been authorized for detection and/or diagnosis of SARS-CoV-2 by FDA under  an Emergency Use Authorization (EUA). This EUA will remain in effect (meaning this test can be used) for the duration of the COVID-19 declaration under Section 564(b)(1) of the Act, 21 U.S.C. section 360bbb-3(b)(1), unless the authorization is terminated or revoked.  Performed at [facility], [place] [address], [place], [place] [address]     Please note: You were cared for by a hospitalist during your hospital stay. Once you are discharged, your primary care physician will handle any further medical issues. Please note that NO REFILLS for any discharge medications will be authorized once you are discharged, as it is imperative that you return to your primary care physician (or establish a relationship with a primary care physician if you do not have one) for your post hospital discharge needs so that they can reassess your need for medications and monitor your lab values.    Time coordinating discharge: 40 minutes  SIGNED:   Shelly Coss, MD  [facility] 02/07/2021, 1:16 PM Pager [phone]  If 7PM-7AM, please contact night-coverage [email or web address] Password [email or web address]

## 2021-02-07 NOTE — Progress Notes (Signed)
PROGRESS NOTE    Cassidy Austin  ZOX:[id number] DOB: 12-24-1950 DOA: 01/27/2021 PCP: Laurann Montana, MD   Chief Complain: Back pain  Brief Narrative: Patient is a 70 year old female with history of hypertension, nephrolithiasis, hypothyroidism, chronic back pain, recent history of sepsis with pyelonephritis who was admitted after outpatient MRI of the lumbar spine showed discitis/osteomyelitis.  Underwent CT-guided aspiration of paraspinal area and days at L1-L2. PT/OT recommended CIR but she was declined. plan is to  discharge to skilled nursing facility and continue IV antibiotics till 03/11/21 .  Patient is medically stable for discharge to skilled nursing facility as soon as bed is available.  Assessment & Plan:   Principal Problem:   Discitis/osteomyelitis of lumbar region Active Problems:   Hypothyroidism   Hypertension   Intractable pain   Impaired ambulation   Protein-calorie malnutrition, severe   L1-L2 discitis/osteomyelitis: Presented with intractable low back pain, not able to ambulate.  MRI done as an outpatient showed discitis/osteomyelitis of L1-L2 region.  IR guided aspiration of the infected area on March 27, culture positive for pansensitive E. coli.  Blood cultures have remained negative.  PICC line placed on 3/30.  ID was following.  Plan is to continue IV antibiotics, cefazolin, till 03/11/2021.  She will follow-up with ID on 4/14 as an outpatient  History of nephrolithiasis:5 mm nonobstructive ureteric calculi & 3 mm right VUJcalculi that is probably obstructive noted on CT 12/11/20  She was evaluated by urology Dr. Marlou Porch during last hospitalization recommended outpatient follow-up, patient report has not follow-up with urology yet.  We recommend urology evaluation as an outpatient.  Continue Flomax.  Ultrasound done here did not show any hydronephrosis.  Normocytic anemia: Iron panel not consistent with iron deficiency or vitamin B12 deficiency, FOBT negative.   Continue monitoring.  Hemoglobin in the range of 8.  Hypertension:On Coreg, irbesartan, hydrochlorothiazide.  She has been noted to be hypertensive since yesterday.  Continue to monitor  Hypothyroidism: Continue Synthyroid  Constipation: Continue bowel regimen  Mild left common iliac lymphadenopathy: Nonspecific finding on CT from 12/11/2020.  Recommend follow-up by CT in 3 months  Debility/deconditioning: PT/OT recommended CIR, insurance declined.  Plan is for skilled nursing facility.  TOC following.  Pressure Injury 12/10/20 Lumbar Lateral;Right Stage 2 -  Partial thickness loss of dermis presenting as a shallow open injury with a red, pink wound bed without slough. skin peeled and redness (Active)  12/10/20 2000  Location: Lumbar  Location Orientation: Lateral;Right  Staging: Stage 2 -  Partial thickness loss of dermis presenting as a shallow open injury with a red, pink wound bed without slough.  Wound Description (Comments): skin peeled and redness  Present on Admission: No       Nutrition Problem: Severe Malnutrition Etiology: acute illness (lumbar discitis/osteomyelitis)      DVT prophylaxis: Lovenox Code Status: Full Family Communication: None at bedside Status is: Inpatient  Remains inpatient appropriate because:Inpatient level of care appropriate due to severity of illness   Dispo:  Patient From: Home  Planned Disposition: Skilled Nursing Facility  Medically stable for discharge: Yes      Consultants: IR  Procedures:Disc aspiration  Antimicrobials:  Anti-infectives (From admission, onward)   Start     Dose/Rate Route Frequency Ordered Stop   02/01/21 1400  ceFAZolin (ANCEF) IVPB 2g/100 mL premix        2 g 200 mL/hr over 30 Minutes Intravenous Every 8 hours 02/01/21 0910     01/29/21 2000  DAPTOmycin (CUBICIN) 750 mg in sodium  chloride 0.9 % IVPB  Status:  Discontinued        750 mg 230 mL/hr over 30 Minutes Intravenous Daily 01/29/21 1521 01/30/21  1424   01/29/21 1700  cefTRIAXone (ROCEPHIN) 2 g in sodium chloride 0.9 % 100 mL IVPB  Status:  Discontinued        2 g 200 mL/hr over 30 Minutes Intravenous Every 24 hours 01/29/21 1521 02/01/21 0910      Subjective: Patient seen and examined the bedside this morning.  Hemodynamically stable.  Pain well controlled on the back.  No new complaints  Objective: Vitals:   02/06/21 1503 02/06/21 2029 02/07/21 0541 02/07/21 0722  BP: 121/77 127/63 133/65 137/65  Pulse: 84 73 69 66  Resp: 15 15 16 17   Temp: 98.3 F (36.8 C) 97.6 F (36.4 C) 98.8 F (37.1 C) 98.7 F (37.1 C)  TempSrc: Oral Oral Oral Oral  SpO2: 99% 97% 100% 99%  Weight:      Height:        Intake/Output Summary (Last 24 hours) at 02/07/2021 1149 Last data filed at 02/07/2021 0915 Gross per 24 hour  Intake 720 ml  Output 3400 ml  Net -2680 ml   Filed Weights   01/27/21 2215  Weight: 114.4 kg    Examination:  General exam: Overall comfortable, not in distress HEENT: PERRL Respiratory system:  no wheezes or crackles  Cardiovascular system: S1 & S2 heard, RRR.  Gastrointestinal system: Abdomen is nondistended, soft and nontender. Central nervous system: Alert and oriented Extremities: No edema, no clubbing ,no cyanosis, PICC line on the right Skin: No rashes, no ulcers,no icterus   Data Reviewed: I have personally reviewed following labs and imaging studies  CBC: Recent Labs  Lab 02/01/21 0242 02/04/21 0334 02/06/21 0401  WBC 7.4 7.3 7.6  NEUTROABS 4.7 4.2  --   HGB 8.2* 8.2* 8.4*  HCT 26.0* 26.0* 26.3*  MCV 88.4 87.8 87.7  PLT 244 259 277   Basic Metabolic Panel: Recent Labs  Lab 02/01/21 0242 02/04/21 0334 02/06/21 0401  NA 139 140 138  K 3.7 3.6 3.5  CL 107 106 103  CO2 27 30 28   GLUCOSE 107* 103* 87  BUN 11 7* 5*  CREATININE 0.74 0.72 0.67  CALCIUM 8.5* 8.6* 8.7*  MG 1.9 2.0 1.9   GFR: Estimated Creatinine Clearance: 82.8 mL/min (by C-G formula based on SCr of 0.67 mg/dL). Liver  Function Tests: No results for input(s): AST, ALT, ALKPHOS, BILITOT, PROT, ALBUMIN in the last 168 hours. No results for input(s): LIPASE, AMYLASE in the last 168 hours. No results for input(s): AMMONIA in the last 168 hours. Coagulation Profile: No results for input(s): INR, PROTIME in the last 168 hours. Cardiac Enzymes: No results for input(s): CKTOTAL, CKMB, CKMBINDEX, TROPONINI in the last 168 hours. BNP (last 3 results) No results for input(s): PROBNP in the last 8760 hours. HbA1C: No results for input(s): HGBA1C in the last 72 hours. CBG: Recent Labs  Lab 02/07/21 0557  GLUCAP 105*   Lipid Profile: No results for input(s): CHOL, HDL, LDLCALC, TRIG, CHOLHDL, LDLDIRECT in the last 72 hours. Thyroid Function Tests: No results for input(s): TSH, T4TOTAL, FREET4, T3FREE, THYROIDAB in the last 72 hours. Anemia Panel: No results for input(s): VITAMINB12, FOLATE, FERRITIN, TIBC, IRON, RETICCTPCT in the last 72 hours. Sepsis Labs: No results for input(s): PROCALCITON, LATICACIDVEN in the last 168 hours.  Recent Results (from the past 240 hour(s))  Resp Panel by RT-PCR (Flu A&B, Covid) Nasopharyngeal  Swab     Status: None   Collection Time: 02/07/21  8:52 AM   Specimen: Nasopharyngeal Swab; Nasopharyngeal(NP) swabs in vial transport medium  Result Value Ref Range Status   SARS Coronavirus 2 by RT PCR NEGATIVE NEGATIVE Final    Comment: (NOTE) SARS-CoV-2 target nucleic acids are NOT DETECTED.  The SARS-CoV-2 RNA is generally detectable in upper respiratory specimens during the acute phase of infection. The lowest concentration of SARS-CoV-2 viral copies this assay can detect is 138 copies/mL. A negative result does not preclude SARS-Cov-2 infection and should not be used as the sole basis for treatment or other patient management decisions. A negative result may occur with  improper specimen collection/handling, submission of specimen other than nasopharyngeal swab, presence of  viral mutation(s) within the areas targeted by this assay, and inadequate number of viral copies(<138 copies/mL). A negative result must be combined with clinical observations, patient history, and epidemiological information. The expected result is Negative.  Fact Sheet for Patients:  BloggerCourse.com  Fact Sheet for Healthcare Providers:  SeriousBroker.it  This test is no t yet approved or cleared by the [place] FDA and  has been authorized for detection and/or diagnosis of SARS-CoV-2 by FDA under an Emergency Use Authorization (EUA). This EUA will remain  in effect (meaning this test can be used) for the duration of the COVID-19 declaration under Section 564(b)(1) of the Act, 21 U.S.C.section 360bbb-3(b)(1), unless the authorization is terminated  or revoked sooner.       Influenza A by PCR NEGATIVE NEGATIVE Final   Influenza B by PCR NEGATIVE NEGATIVE Final    Comment: (NOTE) The Xpert Xpress SARS-CoV-2/FLU/RSV plus assay is intended as an aid in the diagnosis of influenza from Nasopharyngeal swab specimens and should not be used as a sole basis for treatment. Nasal washings and aspirates are unacceptable for Xpert Xpress SARS-CoV-2/FLU/RSV testing.  Fact Sheet for Patients: BloggerCourse.com  Fact Sheet for Healthcare Providers: SeriousBroker.it  This test is not yet approved or cleared by the [place] FDA and has been authorized for detection and/or diagnosis of SARS-CoV-2 by FDA under an Emergency Use Authorization (EUA). This EUA will remain in effect (meaning this test can be used) for the duration of the COVID-19 declaration under Section 564(b)(1) of the Act, 21 U.S.C. section 360bbb-3(b)(1), unless the authorization is terminated or revoked.  Performed at [facility], [address] [place], [place] [address]          Radiology  Studies: No results found.      Scheduled Meds: . aspirin EC  81 mg Oral Daily  . carvedilol  12.5 mg Oral BID  . Chlorhexidine Gluconate Cloth  6 each Topical Daily  . cholecalciferol  1,000 Units Oral BID  . enoxaparin (LOVENOX) injection  40 mg Subcutaneous Q24H  . ezetimibe  10 mg Oral Daily  . feeding supplement  237 mL Oral BID BM  . gabapentin  100 mg Oral TID  . hydrochlorothiazide  25 mg Oral Daily  . irbesartan  300 mg Oral Daily  . levothyroxine  100 mcg Oral Q0600  . multivitamin with minerals  1 tablet Oral Daily  . sodium chloride flush  10-40 mL Intracatheter Q12H  . tamsulosin  0.4 mg Oral QPC breakfast   Continuous Infusions: .  ceFAZolin (ANCEF) IV 2 g (02/07/21 0544)     LOS: 11 days    Time spent: 25 mins.More than 50% of that time was spent in counseling and/or coordination of  care.      Burnadette Pop, MD [facility] P4/04/2021, 11:49 AM

## 2021-02-07 NOTE — Progress Notes (Addendum)
Subjective: Patient reports "I'm doing alright"  Objective: Vital signs in last 24 hours: Temp:  [97.6 F (36.4 C)-98.8 F (37.1 C)] 98.7 F (37.1 C) (04/06 0722) Pulse Rate:  [66-84] 66 (04/06 0722) Resp:  [15-17] 17 (04/06 0722) BP: (121-162)/(63-84) 137/65 (04/06 0722) SpO2:  [97 %-100 %] 99 % (04/06 0722)  Intake/Output from previous day: 04/05 0701 - 04/06 0700 In: 720 [P.O.:720] Out: 2800 [Urine:2800] Intake/Output this shift: No intake/output data recorded.  Alert, conversant. MAEW. Reports no pain at present.  Lab Results: Recent Labs    02/06/21 0401  WBC 7.6  HGB 8.4*  HCT 26.3*  PLT 277   BMET Recent Labs    02/06/21 0401  NA 138  K 3.5  CL 103  CO2 28  GLUCOSE 87  BUN 5*  CREATININE 0.67  CALCIUM 8.7*    Studies/Results: No results found.  Assessment/Plan: improving  LOS: 11 days  Awaiting SNF placement. IVAB vis PICC. Outpatient follow up in 1 month if symptoms persist. Pt is aware of plan and agreeable.    Georgiann Cocker 02/07/2021, 7:56 AM   Patient's pain is improving.  Working on mobility.  Continue IV ABX for discitis.

## 2021-02-09 DIAGNOSIS — I10 Essential (primary) hypertension: Secondary | ICD-10-CM | POA: Diagnosis not present

## 2021-02-09 DIAGNOSIS — M4626 Osteomyelitis of vertebra, lumbar region: Secondary | ICD-10-CM | POA: Diagnosis not present

## 2021-02-09 DIAGNOSIS — E785 Hyperlipidemia, unspecified: Secondary | ICD-10-CM | POA: Diagnosis not present

## 2021-02-09 DIAGNOSIS — M4646 Discitis, unspecified, lumbar region: Secondary | ICD-10-CM | POA: Diagnosis not present

## 2021-02-09 DIAGNOSIS — R531 Weakness: Secondary | ICD-10-CM | POA: Diagnosis not present

## 2021-02-09 DIAGNOSIS — E039 Hypothyroidism, unspecified: Secondary | ICD-10-CM | POA: Diagnosis not present

## 2021-02-15 ENCOUNTER — Other Ambulatory Visit: Payer: Self-pay

## 2021-02-15 ENCOUNTER — Encounter: Payer: Self-pay | Admitting: Infectious Diseases

## 2021-02-15 ENCOUNTER — Ambulatory Visit (INDEPENDENT_AMBULATORY_CARE_PROVIDER_SITE_OTHER): Payer: Medicare PPO | Admitting: Infectious Diseases

## 2021-02-15 VITALS — BP 116/74 | HR 80 | Temp 98.6°F

## 2021-02-15 DIAGNOSIS — M4646 Discitis, unspecified, lumbar region: Secondary | ICD-10-CM

## 2021-02-15 DIAGNOSIS — Z5181 Encounter for therapeutic drug level monitoring: Secondary | ICD-10-CM | POA: Diagnosis not present

## 2021-02-15 DIAGNOSIS — G061 Intraspinal abscess and granuloma: Secondary | ICD-10-CM

## 2021-02-15 NOTE — Addendum Note (Signed)
Addended by: Juanita Laster on: 02/15/2021 01:44 PM   Modules accepted: Orders

## 2021-02-15 NOTE — Progress Notes (Signed)
[facility]                                                             [place], [place], [place], [address]                                                                  Phn. [phone]; Fax: [phone]                                                                             Date: 02/14/21  Reason for Referral: HFU for discitis and intra discal abscess   Assessment/plan Problem List Items Addressed This Visit      Musculoskeletal and Integument   Discitis/osteomyelitis of lumbar region    Other Visit Diagnoses    Intraspinal abscess and granuloma    -  Primary   Relevant Orders   MR LUMBAR SPINE WO CONTRAST   Medication monitoring encounter         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 Coli  Medication monitoring  4/7 ESR 27, CRP 2, Absolute eosinophils 862  Plan Continue cefazolin, projected end date 5/8, will get a MRI L spine for f/u on intradiscal abscess before stop date around 5/3 Fu in 3 weeks , before 5/8 Will fu Labs  Orders Placed This Encounter  Procedures  . MR LUMBAR SPINE WO CONTRAST   All questions and concerns were discussed and addressed. Patient verbalized understanding of the plan. ____________________________________________________________________________________________________________________  HPI: 70 year old female with past medical history of hypertension, hypothyroidism who is here for HFU after recent hospital admission 3/26-4/6 for Lumbar discitis/OM and possible intradiscal abscess extending into RT psoas. MRI LS 3/25 showed discitis/osteomyelitis at L1-L2 with paraspinal edema. Intradiscal abscess extending into the right psoas is not excluded.Evaluated by NeuroSX in the hospital and underwent CT  CT-guided aspiration on 3/27 with cultures growing E. Coli. Seen by ID, initially on daptomycin and ceftriaxone which was de-escalated to cefazolin after sensi's. Planned for 6 weeks, end date 03/11/21. She was discharged to a SNF on 02/07/21   02/14/21 Doing well. Back pain has improved from 10 to 4. Denies fevers, chills and sweats. Denies nausea, vomiting, abdominal pain and diarrhea. She is working with PT and is able to stand without support. She feels she is getting much stronger. Getting IV cefazolin as instructed. No issues with PICC line or new complains. She will follow up with me in 3 weeks before he end date of antibiotics after her MRI Lumbar spine is done. She verbalised understanding to the plan.   ROS: 12 point ROS done with pertinent positives and negative listed above  Past Medical History:  Diagnosis Date  . Complication of anesthesia    hard to wake up after eye surgery in 1970s  . Goiter    with thyroid nodule, FNA 47/65 benign follicular nodule  . Hypercholesterolemia   . Hypertension   . Hypothyroidism   . OA (osteoarthritis) of knee    mod.  Marland Kitchen Spondylolisthesis of lumbar region    Past Surgical History:  Procedure Laterality Date  . COLONOSCOPY    . EYE SURGERY    . THYROID SURGERY  1999   Current Outpatient Medications on File Prior to Visit  Medication Sig Dispense Refill  . ASPERCREME LIDOCAINE 4 % PTCH Apply 1 patch topically daily.    Marland Kitchen aspirin 81 MG chewable tablet Chew 81 mg by mouth every morning.    . carvedilol (COREG) 12.5 MG tablet Take 12.5 mg by mouth 2 (two) times daily.    Marland Kitchen ceFAZolin (ANCEF) IVPB Inject 2 g into the vein every 8 (eight) hours. Indication:  osteomyelitis First Dose: No Last Day of Therapy:  03/14/2021 Labs - Once weekly:  CBC/D and BMP, Labs - Every other week:  ESR and CRP Method of administration: IV Push Method of administration may be changed at the discretion of home infusion pharmacist based upon assessment of the patient  and/or caregiver's ability to self-administer the medication ordered. 123 Units 0  . cholecalciferol (VITAMIN D) 1000 UNITS tablet Take 1,000 Units by mouth 2 (two) times daily.    . cyclobenzaprine (FLEXERIL) 5 MG tablet Take 5 mg by mouth 3 (three) times daily as needed for muscle spasms.    Marland Kitchen ezetimibe (ZETIA) 10 MG tablet Take 10 mg by mouth daily.    Marland Kitchen gabapentin (NEURONTIN) 100 MG capsule Take 1 capsule (100 mg total) by mouth 3 (three) times daily. 21 capsule 0  . Infant Care Products [facility]) OINT Apply 1 application topically daily.    Marland Kitchen levothyroxine (SYNTHROID, LEVOTHROID) 100 MCG tablet Take 100 mcg by mouth daily at 6 (six) AM.    . Magnesium 250 MG TABS Take 500 mg by mouth every morning.    . olmesartan-hydrochlorothiazide (BENICAR HCT) 40-25 MG tablet Take 1 tablet by mouth daily.    Marland Kitchen oxyCODONE (ROXICODONE) 5 MG immediate release tablet Take 1 tablet (5 mg total) by mouth every 6 (six) hours as needed for severe pain. 15 tablet 0  . Skin Protectants, Misc. (MINERIN CREME) CREA Apply 1 application topically daily.    . tamsulosin (FLOMAX) 0.4 MG CAPS capsule Take 1 capsule (0.4 mg total) by mouth daily after breakfast. 30 capsule   . tetrahydrozoline 0.05 % ophthalmic solution Place 1 drop into both eyes daily as needed (dry eyes).    Marland Kitchen tiZANidine (ZANAFLEX) 2 MG tablet Take 1 tablet (2 mg total) by mouth every 8 (eight) hours as needed for muscle spasms. 21 tablet 0  . Turmeric 500 MG CAPS Take 1 capsule by mouth 2 (two) times daily.    . vitamin C (ASCORBIC ACID) 500 MG tablet Take 500 mg by mouth daily.     No current facility-administered medications on file prior to visit.   No Known Allergies  Social History   Socioeconomic History  . Marital status: Divorced    Spouse name: Not on file  . Number of children: Not on file  . Years of education: Not on file  . Highest education level: Not on file  Occupational History  . Not on file  Tobacco Use  . Smoking  status: Never Smoker  . Smokeless tobacco: Never Used  Vaping Use  . Vaping Use: Never used  Substance and Sexual Activity  . Alcohol use: No  . Drug use: No  . Sexual activity: Not on file  Other Topics Concern  . Not on file  Social History Narrative  . Not on file   Social Determinants of Health   Financial Resource Strain: Not on file  Food Insecurity: Not on file  Transportation Needs: Not on file  Physical Activity: Not on file  Stress: Not on file  Social Connections: Not on file  Intimate Partner Violence: Not on file    Vitals BP 116/74   Pulse 80   Temp 98.6 F (37 C) (Oral)    Examination  General - not in acute distress, comfortably sitting in  [place], no pallor and no icterus Chest - b/l clear air entry, no additional sounds CVS- Normal s1s2, RRR Abdomen - Soft, Non tender , non distended Ext- no pedal edema Neuro: grossly normal Back - WNL Psych : calm and cooperative   Recent labs CBC Latest Ref Rng & Units 02/06/2021 02/04/2021 02/01/2021  WBC 4.0 - 10.5 K/uL 7.6 7.3 7.4  Hemoglobin 12.0 - 15.0 g/dL 8.4(L) 8.2(L) 8.2(L)  Hematocrit 36.0 - 46.0 % 26.3(L) 26.0(L) 26.0(L)  Platelets 150 - 400 K/uL 277 259 244   CMP Latest Ref Rng & Units 02/06/2021 02/04/2021 02/01/2021  Glucose 70 - 99 mg/dL 87 103(H) 107(H)  BUN 8 - 23 mg/dL 5(L) 7(L) 11  Creatinine 0.44 - 1.00 mg/dL 0.67 0.72 0.74  Sodium 135 - 145 mmol/L 138 140 139  Potassium 3.5 - 5.1 mmol/L 3.5 3.6 3.7  Chloride 98 - 111 mmol/L 103 106 107  CO2 22 - 32 mmol/L $RemoveB'28 30 27  'pFLfjJHS$ Calcium 8.9 - 10.3 mg/dL 8.7(L) 8.6(L) 8.5(L)  Total Protein 6.5 - 8.1 g/dL - - -  Total Bilirubin 0.3 - 1.2 mg/dL - - -  Alkaline Phos 38 - 126 U/L - - -  AST 15 - 41 U/L - - -  ALT 0 - 44 U/L - - -     Pertinent Microbiology Results for orders placed or performed during the hospital encounter of 01/27/21  Culture, blood (routine x 2)     Status: None   Collection Time: 01/27/21  6:33 PM   Specimen:  BLOOD  Result Value Ref Range Status   Specimen Description BLOOD LEFT ANTECUBITAL  Final   Special Requests   Final    BOTTLES DRAWN AEROBIC AND ANAEROBIC Blood Culture adequate volume   Culture   Final    NO GROWTH 5 DAYS Performed at [facility], [address] [place], [place] [address]    Report Status 02/01/2021 FINAL  Final  Resp Panel by RT-PCR (Flu A&B, Covid) Nasopharyngeal Swab     Status: None   Collection Time: 01/27/21  6:33 PM   Specimen: Nasopharyngeal Swab; Nasopharyngeal(NP) swabs in vial transport medium  Result Value Ref Range Status   SARS Coronavirus 2 by RT PCR NEGATIVE NEGATIVE Final    Comment: (NOTE) SARS-CoV-2 target nucleic acids are NOT DETECTED.  The SARS-CoV-2 RNA is generally detectable in upper respiratory specimens during the acute phase of infection. The lowest concentration of SARS-CoV-2 viral copies this assay can detect is 138 copies/mL. A negative result does not preclude SARS-Cov-2 infection and should not be used as the sole basis for treatment or other patient management decisions. A  negative result may occur with  improper specimen collection/handling, submission of specimen other than nasopharyngeal swab, presence of viral mutation(s) within the areas targeted by this assay, and inadequate number of viral copies(<138 copies/mL). A negative result must be combined with clinical observations, patient history, and epidemiological information. The expected result is Negative.  Fact Sheet for Patients:  EntrepreneurPulse.com.au  Fact Sheet for Healthcare Providers:  IncredibleEmployment.be  This test is no t yet approved or cleared by the [place] FDA and  has been authorized for detection and/or diagnosis of SARS-CoV-2 by FDA under an Emergency Use Authorization (EUA). This EUA will remain  in effect (meaning this test can be used) for the duration of the COVID-19 declaration under Section  564(b)(1) of the Act, 21 U.S.C.section 360bbb-3(b)(1), unless the authorization is terminated  or revoked sooner.       Influenza A by PCR NEGATIVE NEGATIVE Final   Influenza B by PCR NEGATIVE NEGATIVE Final    Comment: (NOTE) The Xpert Xpress SARS-CoV-2/FLU/RSV plus assay is intended as an aid in the diagnosis of influenza from Nasopharyngeal swab specimens and should not be used as a sole basis for treatment. Nasal washings and aspirates are unacceptable for Xpert Xpress SARS-CoV-2/FLU/RSV testing.  Fact Sheet for Patients: EntrepreneurPulse.com.au  Fact Sheet for Healthcare Providers: IncredibleEmployment.be  This test is not yet approved or cleared by the [place] FDA and has been authorized for detection and/or diagnosis of SARS-CoV-2 by FDA under an Emergency Use Authorization (EUA). This EUA will remain in effect (meaning this test can be used) for the duration of the COVID-19 declaration under Section 564(b)(1) of the Act, 21 U.S.C. section 360bbb-3(b)(1), unless the authorization is terminated or revoked.  Performed at [facility], [place] [address], [place], [place] [address]   Urine culture     Status: Abnormal   Collection Time: 01/27/21  6:33 PM   Specimen: Urine, Random  Result Value Ref Range Status   Specimen Description URINE, RANDOM  Final   Special Requests   Final    NONE Performed at [facility], [place] [address], [place], [place] [address]    Culture MULTIPLE SPECIES PRESENT, SUGGEST RECOLLECTION (A)  Final   Report Status 01/29/2021 FINAL  Final  Aerobic/Anaerobic Culture (surgical/deep wound)     Status: None   Collection Time: 01/28/21 10:47 AM   Specimen: Fluid; Tissue  Result Value Ref Range Status   Specimen Description FLUID L1/L2 INTERVERTEBRAL DISC AND PARASPINAL  Final   Special Requests NONE  Final   Gram Stain   Final    MODERATE WBC PRESENT, PREDOMINANTLY PMN NO ORGANISMS SEEN     Culture   Final    RARE ESCHERICHIA COLI NO ANAEROBES ISOLATED Performed at [facility], [address] [place], [place] [address]    Report Status 02/02/2021 FINAL  Final   Organism ID, Bacteria ESCHERICHIA COLI  Final      Susceptibility   Escherichia coli - MIC*    AMPICILLIN 4 SENSITIVE Sensitive     CEFAZOLIN <=4 SENSITIVE Sensitive     CEFEPIME <=0.12 SENSITIVE Sensitive     CEFTAZIDIME <=1 SENSITIVE Sensitive     CEFTRIAXONE <=0.25 SENSITIVE Sensitive     CIPROFLOXACIN <=0.25 SENSITIVE Sensitive     GENTAMICIN <=1 SENSITIVE Sensitive     IMIPENEM <=0.25 SENSITIVE Sensitive     TRIMETH/SULFA <=20 SENSITIVE Sensitive     AMPICILLIN/SULBACTAM <=2 SENSITIVE Sensitive     PIP/TAZO <=4 SENSITIVE Sensitive     *  RARE ESCHERICHIA COLI  Resp Panel by RT-PCR (Flu A&B, Covid) Nasopharyngeal Swab     Status: None   Collection Time: 02/07/21  8:52 AM   Specimen: Nasopharyngeal Swab; Nasopharyngeal(NP) swabs in vial transport medium  Result Value Ref Range Status   SARS Coronavirus 2 by RT PCR NEGATIVE NEGATIVE Final    Comment: (NOTE) SARS-CoV-2 target nucleic acids are NOT DETECTED.  The SARS-CoV-2 RNA is generally detectable in upper respiratory specimens during the acute phase of infection. The lowest concentration of SARS-CoV-2 viral copies this assay can detect is 138 copies/mL. A negative result does not preclude SARS-Cov-2 infection and should not be used as the sole basis for treatment or other patient management decisions. A negative result may occur with  improper specimen collection/handling, submission of specimen other than nasopharyngeal swab, presence of viral mutation(s) within the areas targeted by this assay, and inadequate number of viral copies(<138 copies/mL). A negative result must be combined with clinical observations, patient history, and epidemiological information. The expected result is Negative.  Fact Sheet for Patients:   EntrepreneurPulse.com.au  Fact Sheet for Healthcare Providers:  IncredibleEmployment.be  This test is no t yet approved or cleared by the [place] FDA and  has been authorized for detection and/or diagnosis of SARS-CoV-2 by FDA under an Emergency Use Authorization (EUA). This EUA will remain  in effect (meaning this test can be used) for the duration of the COVID-19 declaration under Section 564(b)(1) of the Act, 21 U.S.C.section 360bbb-3(b)(1), unless the authorization is terminated  or revoked sooner.       Influenza A by PCR NEGATIVE NEGATIVE Final   Influenza B by PCR NEGATIVE NEGATIVE Final    Comment: (NOTE) The Xpert Xpress SARS-CoV-2/FLU/RSV plus assay is intended as an aid in the diagnosis of influenza from Nasopharyngeal swab specimens and should not be used as a sole basis for treatment. Nasal washings and aspirates are unacceptable for Xpert Xpress SARS-CoV-2/FLU/RSV testing.  Fact Sheet for Patients: EntrepreneurPulse.com.au  Fact Sheet for Healthcare Providers: IncredibleEmployment.be  This test is not yet approved or cleared by the [place] FDA and has been authorized for detection and/or diagnosis of SARS-CoV-2 by FDA under an Emergency Use Authorization (EUA). This EUA will remain in effect (meaning this test can be used) for the duration of the COVID-19 declaration under Section 564(b)(1) of the Act, 21 U.S.C. section 360bbb-3(b)(1), unless the authorization is terminated or revoked.  Performed at [facility], [place] [address] [place], [place] [address]       Pertinent Imaging All pertinent labs/Imagings/notes reviewed. All pertinent plain films and CT images have been personally visualized and interpreted; radiology reports have been reviewed. Decision making incorporated into the Impression / Recommendations.  I have spent 30  minutes for this patient encounter  including  review of prior medical records with greater than 50% of time in face to face counsel of the patient/discussing diagnostics and plan of care.   Electronically signed by:  Rosiland Oz, MD Infectious Disease Physician [facility] [address] [place], [place] [address] Phone: [phone]  Fax: [phone]

## 2021-02-17 ENCOUNTER — Emergency Department (HOSPITAL_COMMUNITY): Payer: Medicare PPO

## 2021-02-17 ENCOUNTER — Other Ambulatory Visit: Payer: Self-pay

## 2021-02-17 ENCOUNTER — Emergency Department (HOSPITAL_COMMUNITY)
Admission: EM | Admit: 2021-02-17 | Discharge: 2021-02-18 | Disposition: A | Discharge status: Home / Self Care | Payer: Medicare PPO | Attending: Emergency Medicine | Admitting: Emergency Medicine

## 2021-02-17 DIAGNOSIS — N289 Disorder of kidney and ureter, unspecified: Secondary | ICD-10-CM | POA: Insufficient documentation

## 2021-02-17 DIAGNOSIS — X58XXXA Exposure to other specified factors, initial encounter: Secondary | ICD-10-CM | POA: Diagnosis not present

## 2021-02-17 DIAGNOSIS — I10 Essential (primary) hypertension: Secondary | ICD-10-CM | POA: Diagnosis not present

## 2021-02-17 DIAGNOSIS — Z20822 Contact with and (suspected) exposure to covid-19: Secondary | ICD-10-CM | POA: Insufficient documentation

## 2021-02-17 DIAGNOSIS — D72829 Elevated white blood cell count, unspecified: Secondary | ICD-10-CM | POA: Diagnosis not present

## 2021-02-17 DIAGNOSIS — M4626 Osteomyelitis of vertebra, lumbar region: Secondary | ICD-10-CM | POA: Diagnosis not present

## 2021-02-17 DIAGNOSIS — Z452 Encounter for adjustment and management of vascular access device: Secondary | ICD-10-CM | POA: Diagnosis not present

## 2021-02-17 DIAGNOSIS — Z8249 Family history of ischemic heart disease and other diseases of the circulatory system: Secondary | ICD-10-CM | POA: Diagnosis not present

## 2021-02-17 DIAGNOSIS — Z7982 Long term (current) use of aspirin: Secondary | ICD-10-CM | POA: Insufficient documentation

## 2021-02-17 DIAGNOSIS — Z79899 Other long term (current) drug therapy: Secondary | ICD-10-CM | POA: Diagnosis not present

## 2021-02-17 DIAGNOSIS — T82594A Other mechanical complication of infusion catheter, initial encounter: Secondary | ICD-10-CM | POA: Insufficient documentation

## 2021-02-17 DIAGNOSIS — I517 Cardiomegaly: Secondary | ICD-10-CM | POA: Diagnosis not present

## 2021-02-17 DIAGNOSIS — D696 Thrombocytopenia, unspecified: Secondary | ICD-10-CM | POA: Diagnosis not present

## 2021-02-17 DIAGNOSIS — E46 Unspecified protein-calorie malnutrition: Secondary | ICD-10-CM | POA: Diagnosis not present

## 2021-02-17 DIAGNOSIS — T82898A Other specified complication of vascular prosthetic devices, implants and grafts, initial encounter: Secondary | ICD-10-CM

## 2021-02-17 NOTE — ED Triage Notes (Signed)
BIB [facility] from [facility]. Facility tried to flush PICC line in right upper arm and was having resistance. Wants placement to be checked. Pt is receiving Cefazolin 2g every 8 hours for osteomyelitis on the spine. Next dose is due at 2200. No complaints at this time.   142/76 72 heart rate  100%

## 2021-02-17 NOTE — Discharge Instructions (Addendum)
Your PICC line has been replaced and you can continue your IV antibiotics at your facility

## 2021-02-17 NOTE — ED Provider Notes (Signed)
[facility] EMERGENCY DEPARTMENT Provider Note   CSN: [id number] Arrival date & time: 02/17/21  2105     History Chief Complaint  Patient presents with  . PICC Issues    Cassidy Austin is a 70 y.o. female.  HPI    70 year old female with recent osteomyelitis of her lumbar spine discharged to rehab facility April 6 who presents today with PICC line malfunction.  She is receiving cefazolin 2 g every 8 hours for osteo-.  They report that they attempted to flush her PICC line today and were having resistance.  Sent to the ED for placement.  She reports no new problems.  Past Medical History:  Diagnosis Date  . Complication of anesthesia    hard to wake up after eye surgery in 1970s  . Goiter    with thyroid nodule, FNA 50/35 benign follicular nodule  . Hypercholesterolemia   . Hypertension   . Hypothyroidism   . OA (osteoarthritis) of knee    mod.  Marland Kitchen Spondylolisthesis of lumbar region     Patient Active Problem List   Diagnosis Date Noted  . Protein-calorie malnutrition, severe 01/29/2021  . Discitis/osteomyelitis of lumbar region 01/27/2021  . Intractable pain 01/27/2021  . Impaired ambulation 01/27/2021  . Pressure injury of skin 12/11/2020  . Chest pain 12/05/2020  . Renal insufficiency 12/05/2020  . Normocytic anemia 12/05/2020  . Thrombocytopenia ([place]) 12/05/2020  . Leukocytosis 12/05/2020  . Acute renal insufficiency   . Low back pain   . Hypothyroidism   . Hypertension   . Complication of anesthesia   . OA (osteoarthritis) of knee   . Hypercholesterolemia   . Goiter     Past Surgical History:  Procedure Laterality Date  . COLONOSCOPY    . EYE SURGERY    . THYROID SURGERY  1999     OB History   No obstetric history on file.     Family History  Problem Relation Age of Onset  . Heart failure Mother   . Breast cancer Neg Hx     Social History   Tobacco Use  . Smoking status: Never Smoker  . Smokeless tobacco: Never Used   Vaping Use  . Vaping Use: Never used  Substance Use Topics  . Alcohol use: No  . Drug use: No    Home Medications Prior to Admission medications   Medication Sig Start Date End Date Taking? Authorizing Provider  ASPERCREME LIDOCAINE 4 % PTCH Apply 1 patch topically daily. 01/12/21   [provider]  aspirin 81 MG chewable tablet Chew 81 mg by mouth every morning.    [provider]  carvedilol (COREG) 12.5 MG tablet Take 12.5 mg by mouth 2 (two) times daily. 01/22/21   [provider]  ceFAZolin (ANCEF) IVPB Inject 2 g into the vein every 8 (eight) hours. Indication:  osteomyelitis First Dose: No Last Day of Therapy:  03/14/2021 Labs - Once weekly:  CBC/D and BMP, Labs - Every other week:  ESR and CRP Method of administration: IV Push Method of administration may be changed at the discretion of home infusion pharmacist based upon assessment of the patient and/or caregiver's ability to self-administer the medication ordered. 02/07/21 03/20/21  Shelly Coss, MD  cholecalciferol (VITAMIN D) 1000 UNITS tablet Take 1,000 Units by mouth 2 (two) times daily.    [provider]  cyclobenzaprine (FLEXERIL) 5 MG tablet Take 5 mg by mouth 3 (three) times daily as needed for muscle spasms. 12/29/20   [provider]  ezetimibe (ZETIA) 10 MG tablet Take 10 mg by mouth daily. 11/21/20   [provider]  gabapentin (NEURONTIN) 100 MG capsule Take 1 capsule (100 mg total) by mouth 3 (three) times daily. 12/29/20   Robinson, [place] N, PA-C  Infant Care Products [facility]) OINT Apply 1 application topically daily. 01/21/21   [provider]  levothyroxine (SYNTHROID, LEVOTHROID) 100 MCG tablet Take 100 mcg by mouth daily at 6 (six) AM.    [provider]  Magnesium 250 MG TABS Take 500 mg by mouth every morning.    [provider]  olmesartan-hydrochlorothiazide (BENICAR HCT) 40-25 MG tablet Take 1 tablet by mouth daily. 12/29/20    [provider]  oxyCODONE (ROXICODONE) 5 MG immediate release tablet Take 1 tablet (5 mg total) by mouth every 6 (six) hours as needed for severe pain. 02/07/21   Shelly Coss, MD  Skin Protectants, Misc. (MINERIN CREME) CREA Apply 1 application topically daily. 12/29/20   [provider]  tamsulosin (FLOMAX) 0.4 MG CAPS capsule Take 1 capsule (0.4 mg total) by mouth daily after breakfast. 12/13/20   Geradine Girt, DO  tetrahydrozoline 0.05 % ophthalmic solution Place 1 drop into both eyes daily as needed (dry eyes).    [provider]  tiZANidine (ZANAFLEX) 2 MG tablet Take 1 tablet (2 mg total) by mouth every 8 (eight) hours as needed for muscle spasms. 12/29/20   Robinson, [place] N, PA-C  Turmeric 500 MG CAPS Take 1 capsule by mouth 2 (two) times daily. 12/13/20   [provider]  vitamin C (ASCORBIC ACID) 500 MG tablet Take 500 mg by mouth daily.    [provider]    Allergies    Patient has no known allergies.  Review of Systems   Review of Systems  All other systems reviewed and are negative.   Physical Exam Updated Vital Signs BP 130/67   Pulse 70   Temp 98.6 F (37 C) (Oral)   Resp 16   Ht 1.676 m ($Remove'5\' 6"'CTMoCkb$ )   Wt 123.4 kg   SpO2 100%   BMI 43.90 kg/m   Physical Exam Vitals and nursing note reviewed.  Constitutional:      Appearance: Normal appearance.  HENT:     Head: Normocephalic.     Right Ear: External ear normal.     Left Ear: External ear normal.     Mouth/Throat:     Pharynx: Oropharynx is clear.  Eyes:     Pupils: Pupils are equal, round, and reactive to light.  Cardiovascular:     Rate and Rhythm: Normal rate.  Abdominal:     Palpations: Abdomen is soft.  Musculoskeletal:     Cervical back: Normal range of motion.     Comments: Right arm with PICC line in place but pulled out some.  Skin:    General: Skin is warm.     Capillary Refill: Capillary refill takes less than 2 seconds.  Neurological:     Mental  Status: She is alert.  Psychiatric:        Mood and Affect: Mood normal.     ED Results / Procedures / Treatments   Labs (all labs ordered are listed, but only abnormal results are displayed) Labs Reviewed - No data to display  EKG None  Radiology DG Chest Portable 1 View  Result Date: 02/17/2021 CLINICAL DATA:  PICC line placement EXAM: PORTABLE CHEST 1 VIEW COMPARISON:  12/06/2020 FINDINGS: Right PICC line is in  place with the tip in the right innominate vein. Heart is mildly enlarged. No confluent opacities or effusions. No acute bony abnormality. IMPRESSION: Right PICC line tip in the right innominate vein. No acute cardiopulmonary disease. Electronically Signed   By: Rolm Baptise M.D.   On: 02/17/2021 23:16   [place] EKG SITE RITE  Result Date: 02/17/2021 If Site Rite image not attached, placement could not be confirmed due to current cardiac rhythm.   Procedures Procedures   Medications Ordered in ED Medications - No data to display  ED Course  I have reviewed the triage vital signs and the nursing notes.  Pertinent labs & imaging results that were available during my care of the patient were reviewed by me and considered in my medical decision making (see chart for details).    MDM Rules/Calculators/A&P                         IV  therapy nurse saw and evaluated.  Due to PICC line being pulled out some, chest x-Arnika Larzelere obtained and and a PICC is not in SVC but in innominate.  They advised patient should have PICC exchange, however, PICC nurse is contacted who is fortunately still in facility.  She advises new PICC placement due to the fact that the patient has been facility.  She is coming to remove prior PICC in place new PICC.  At which time patient may be discharged back to facility.  Dr. Roxanne Mins advised and will discharge after Final Clinical Impression(s) / ED Diagnoses Final diagnoses:  Occlusion of peripherally inserted central catheter (PICC) line, initial encounter [facility])     Rx / DC Orders ED Discharge Orders    None       Pattricia Boss, MD 02/17/21 2353

## 2021-02-18 ENCOUNTER — Emergency Department: Payer: Self-pay

## 2021-02-18 MED ORDER — CHLORHEXIDINE GLUCONATE CLOTH 2 % EX PADS: 6.0000 | MEDICATED_PAD | Freq: Every day | CUTANEOUS | Status: DC

## 2021-02-18 MED ORDER — SODIUM CHLORIDE 0.9% FLUSH: 10.0000 mL | INTRAVENOUS | Status: DC | PRN

## 2021-02-18 MED ORDER — SODIUM CHLORIDE 0.9% FLUSH: 10.0000 mL | Freq: Two times a day (BID) | INTRAVENOUS | Status: DC

## 2021-02-18 NOTE — ED Notes (Signed)
PTAR called  

## 2021-02-18 NOTE — Progress Notes (Signed)
Peripherally Inserted Central Catheter Placement  The IV Nurse has discussed with the patient and/or persons authorized to consent for the patient, the purpose of this procedure and the potential benefits and risks involved with this procedure.  The benefits include less needle sticks, lab draws from the catheter, and the patient may be discharged home with the catheter. Risks include, but not limited to, infection, bleeding, blood clot (thrombus formation), and puncture of an artery; nerve damage and irregular heartbeat and possibility to perform a PICC exchange if needed/ordered by physician.  Alternatives to this procedure were also discussed.  Bard Power PICC patient education guide, fact sheet on infection prevention and patient information card has been provided to patient /or left at bedside.    PICC Placement Documentation  PICC Single Lumen 02/18/21 Right Cephalic 39 cm 0 cm (Active)  Indication for Insertion or Continuance of Line Home intravenous therapies (PICC only) 02/18/21 0138  Exposed Catheter (cm) 0 cm 02/18/21 0138  Site Assessment Clean;Dry;Intact 02/18/21 0138  Line Status Capped (central line);Flushed;Blood return noted 02/18/21 0138  Dressing Type Transparent;Securing device 02/18/21 0138  Dressing Status Clean;Dry;Intact 02/18/21 0138  Antimicrobial disc in place? Yes 02/18/21 0138  Safety Lock Not Applicable 02/18/21 0138  Line Care Connections checked and tightened 02/18/21 0138  Line Adjustment (NICU/IV Team Only) No 02/18/21 0138  Dressing Intervention New dressing 02/18/21 0138  Dressing Change Due 02/19/21 02/18/21 0138       Burnard Bunting Chenice 02/18/2021, 1:39 AM

## 2021-02-18 NOTE — ED Notes (Signed)
Facility called and made aware of new PICC line and that the other was d/c. Facility also made aware that she didn't receive any antibiotics while here at the hospital. Pt is being discharged by [facility].

## 2021-02-19 DIAGNOSIS — N201 Calculus of ureter: Secondary | ICD-10-CM | POA: Diagnosis not present

## 2021-02-19 DIAGNOSIS — N130 Hydronephrosis with ureteropelvic junction obstruction: Secondary | ICD-10-CM | POA: Diagnosis not present

## 2021-03-01 ENCOUNTER — Ambulatory Visit (HOSPITAL_COMMUNITY)
Admission: RE | Admit: 2021-03-01 | Discharge: 2021-03-01 | Disposition: A | Discharge status: Home / Self Care | Payer: Medicare PPO | Source: Ambulatory Visit | Attending: Infectious Diseases | Admitting: Infectious Diseases

## 2021-03-01 ENCOUNTER — Other Ambulatory Visit: Payer: Self-pay

## 2021-03-01 DIAGNOSIS — M48061 Spinal stenosis, lumbar region without neurogenic claudication: Secondary | ICD-10-CM | POA: Diagnosis not present

## 2021-03-01 DIAGNOSIS — M5126 Other intervertebral disc displacement, lumbar region: Secondary | ICD-10-CM | POA: Diagnosis not present

## 2021-03-01 DIAGNOSIS — M5127 Other intervertebral disc displacement, lumbosacral region: Secondary | ICD-10-CM | POA: Diagnosis not present

## 2021-03-01 DIAGNOSIS — G061 Intraspinal abscess and granuloma: Secondary | ICD-10-CM | POA: Insufficient documentation

## 2021-03-01 MED ORDER — GADOBUTROL 1 MMOL/ML IV SOLN
10.0000 mL | Freq: Once | INTRAVENOUS | Status: AC | PRN
  Administered 2021-03-01: 10 mL via INTRAVENOUS

## 2021-03-05 ENCOUNTER — Encounter: Payer: Self-pay | Admitting: Infectious Diseases

## 2021-03-05 NOTE — Progress Notes (Signed)
Reviewed MRI Lumbar spine from 4/28.   IMPRESSION: 1. Persistent edema of the bilateral psoas muscles with phlegmon/fluid collection extending from the anterior epidural space to the left psoas muscles. This appear mildly improved, particularly the anterior epidural component which is better seen on the sagittal STIR sequence. 2. Erosive changes in the L1-2 endplates with interval progressive loss of intervertebral disc height. 3. Moderate spinal canal stenosis at L1-L2 and L3-4 and mild spinal canal stenosis at L4-L5. 4. Severe bilateral neural foraminal narrowing at L1-L2.  I spoke with on call radiologist on 5/2 at 12: 40 pm Dr Tommi Rumps- Meyer Russel. She says the " phlegmon " size is roughly 3.5 cm * 1.6 cm ( irregular in shape) and there is a fluid in the middle of the phlegmon which measures 4 mm in thickness. She says it may be aspirated for diagnostic purpose but may not be too helpful for drainage given the size.   Will continue cefazolin for now. Patient has a fu appt with me on 03/09/2021.

## 2021-03-09 ENCOUNTER — Other Ambulatory Visit: Payer: Self-pay

## 2021-03-09 ENCOUNTER — Ambulatory Visit: Payer: Medicare PPO | Admitting: Infectious Diseases

## 2021-03-09 VITALS — BP 148/78 | HR 80 | Temp 98.4°F

## 2021-03-09 DIAGNOSIS — Z5181 Encounter for therapeutic drug level monitoring: Secondary | ICD-10-CM | POA: Diagnosis not present

## 2021-03-09 DIAGNOSIS — G062 Extradural and subdural abscess, unspecified: Secondary | ICD-10-CM | POA: Diagnosis not present

## 2021-03-09 DIAGNOSIS — M4646 Discitis, unspecified, lumbar region: Secondary | ICD-10-CM

## 2021-03-09 NOTE — Progress Notes (Signed)
[facility]                                                             [place], [place], [place], [address]                                                                  Phn. [phone]; Fax: [phone]                                                                             Date: 03/09/21  Reason for Referral: HFU for discitis and intra discal abscess   Assessment/plan Problem List Items Addressed This Visit      Musculoskeletal and Integument   Discitis/osteomyelitis of lumbar region   Relevant Orders   MR LUMBAR SPINE WO CONTRAST   C-reactive protein    Other Visit Diagnoses    Epidural abscess    -  Primary   Medication monitoring encounter          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 Coli. Repeat MRL L spine 4/28 persistent oedema with phlegmon and fluid collection extending from the anterior epidural space to the left psoas muscles, mildly improved   Medication monitoring  Labs 5/5 Cr 0.73, ESR 55, hb 8.4  Plan Continue cefazolin for 3 more weeks. Will plan for repeat MRI l spine between 2-3 weeks to make sure phlegmon and fluid collection is resolved before discontinuing antibiotics  All questions and concerns were discussed and addressed. Patient verbalized understanding of the plan. ____________________________________________________________________________________________________________________  HPI: 70 year old female with past medical history of hypertension, hypothyroidism who is here for HFU after recent hospital admission 3/26-4/6 for Lumbar discitis/OM and possible intradiscal abscess extending into RT psoas. MRI LS 3/25 showed discitis/osteomyelitis at L1-L2 with paraspinal edema. Intradiscal abscess extending  into the right psoas is not excluded.Evaluated by NeuroSX in the hospital and underwent CT CT-guided aspiration on 3/27 with cultures growing E. Coli. Seen by ID, initially on daptomycin and ceftriaxone which was de-escalated to cefazolin after sensi's. Planned for 6 weeks, end date 03/11/21. She was discharged to a SNF on 02/07/21   02/14/21 Doing well. Back pain has improved from 10 to 4. Denies fevers, chills and sweats. Denies nausea, vomiting, abdominal pain and diarrhea. She is working with PT and is able to stand without support. She feels she is getting much stronger. Getting IV cefazolin as instructed. No issues with PICC line or new complains. She will follow up with me in 3 weeks before he end date of antibiotics after her MRI Lumbar spine is done. She verbalised understanding to the plan.   03/09/21 Doing well. Back  pain is down to 1-2/10. She is able to walk with the help of walker. Discussed recent MRL spine results and need to continue 2-3 more weeks of IV abx and plan to repeat MRI spine in 2 weeks before discontinuing antibiotics to which she agreed. No issues with antibiotics like nausea, vomiting, rashes and diarrhea. No issues with the PICC line. She is pleased with her progress and very appreciative of the tx.  ROS: 12 point ROS done with pertinent positives and negative listed above   Past Medical History:  Diagnosis Date  . Complication of anesthesia    hard to wake up after eye surgery in 1970s  . Goiter    with thyroid nodule, FNA 14/78 benign follicular nodule  . Hypercholesterolemia   . Hypertension   . Hypothyroidism   . OA (osteoarthritis) of knee    mod.  Marland Kitchen Spondylolisthesis of lumbar region    Past Surgical History:  Procedure Laterality Date  . COLONOSCOPY    . EYE SURGERY    . THYROID SURGERY  1999   Current Outpatient Medications on File Prior to Visit  Medication Sig Dispense Refill  . ASPERCREME LIDOCAINE 4 % PTCH Apply 1 patch topically daily.    Marland Kitchen  aspirin 81 MG chewable tablet Chew 81 mg by mouth every morning.    . carvedilol (COREG) 12.5 MG tablet Take 12.5 mg by mouth 2 (two) times daily.    Marland Kitchen ceFAZolin (ANCEF) IVPB Inject 2 g into the vein every 8 (eight) hours. Indication:  osteomyelitis First Dose: No Last Day of Therapy:  03/14/2021 Labs - Once weekly:  CBC/D and BMP, Labs - Every other week:  ESR and CRP Method of administration: IV Push Method of administration may be changed at the discretion of home infusion pharmacist based upon assessment of the patient and/or caregiver's ability to self-administer the medication ordered. 123 Units 0  . cholecalciferol (VITAMIN D) 1000 UNITS tablet Take 1,000 Units by mouth 2 (two) times daily.    . cyclobenzaprine (FLEXERIL) 5 MG tablet Take 5 mg by mouth 3 (three) times daily as needed for muscle spasms.    Marland Kitchen docusate sodium (COLACE) 100 MG capsule Take 100 mg by mouth 2 (two) times daily.    Marland Kitchen ezetimibe (ZETIA) 10 MG tablet Take 10 mg by mouth at bedtime.    . ferrous sulfate 325 (65 FE) MG tablet Take 325 mg by mouth daily with breakfast.    . gabapentin (NEURONTIN) 100 MG capsule Take 1 capsule (100 mg total) by mouth 3 (three) times daily. (Patient taking differently: Take 200 mg by mouth 3 (three) times daily.) 21 capsule 0  . Heparin Lock Flush (HEPARIN FLUSH) 10 UNIT/ML SOLN injection Inject 10 Units into the vein See admin instructions. Every 8 hours, start date 02/07/21 end date 03/15/21    . Infant Care Products [facility]) OINT Apply 1 application topically daily.    Marland Kitchen levothyroxine (SYNTHROID, LEVOTHROID) 100 MCG tablet Take 100 mcg by mouth daily at 6 (six) AM.    . Magnesium 250 MG TABS Take 500 mg by mouth every morning.    . olmesartan-hydrochlorothiazide (BENICAR HCT) 40-25 MG tablet Take 1 tablet by mouth daily.    Marland Kitchen oxyCODONE (ROXICODONE) 5 MG immediate release tablet Take 1 tablet (5 mg total) by mouth every 6 (six) hours as needed for severe pain. 15 tablet 0  .  PRESCRIPTION MEDICATION Inject into the vein See admin instructions. Normal saline flush- 2 flushes  IV q 8  hours PICC line, using SASH method for ABT therapy    . Skin Protectants, Misc. (MINERIN CREME) CREA Apply 1 application topically daily.    . tamsulosin (FLOMAX) 0.4 MG CAPS capsule Take 1 capsule (0.4 mg total) by mouth daily after breakfast. 30 capsule   . tetrahydrozoline 0.05 % ophthalmic solution Place 1 drop into both eyes daily as needed (dry eyes).    Marland Kitchen tiZANidine (ZANAFLEX) 2 MG tablet Take 1 tablet (2 mg total) by mouth every 8 (eight) hours as needed for muscle spasms. 21 tablet 0  . Turmeric 500 MG CAPS Take 1 capsule by mouth 2 (two) times daily.    . vitamin C (ASCORBIC ACID) 500 MG tablet Take 500 mg by mouth daily.     No current facility-administered medications on file prior to visit.    Allergies  Allergen Reactions  . Atorvastatin     Other reaction(s): muscle aches  . Influenza Vac Recombinant Ha Trivalent [Influenza Vaccine Recombinant]     Other reaction(s): severe reaction in 1970s--skin pealed  . Rosuvastatin     Other reaction(s): muslce aches    Social History   Socioeconomic History  . Marital status: Divorced    Spouse name: Not on file  . Number of children: Not on file  . Years of education: Not on file  . Highest education level: Not on file  Occupational History  . Not on file  Tobacco Use  . Smoking status: Never Smoker  . Smokeless tobacco: Never Used  Vaping Use  . Vaping Use: Never used  Substance and Sexual Activity  . Alcohol use: No  . Drug use: No  . Sexual activity: Not on file  Other Topics Concern  . Not on file  Social History Narrative  . Not on file   Social Determinants of Health   Financial Resource Strain: Not on file  Food Insecurity: Not on file  Transportation Needs: Not on file  Physical Activity: Not on file  Stress: Not on file  Social Connections: Not on file  Intimate Partner Violence: Not on file     Vitals BP (!) 148/78   Pulse 80   Temp 98.4 F (36.9 C) (Oral)   SpO2 98%    Examination  General - not in acute distress, comfortably sitting in  [place], no pallor and no icterus Chest - b/l clear air entry, no additional sounds CVS- Normal s1s2, RRR Abdomen - Soft, Non tender , non distended Ext- no pedal edema Neuro: grossly normal Back - WNL, no vertebral tenderness Psych : calm and cooperative   Recent labs CBC Latest Ref Rng & Units 02/06/2021 02/04/2021 02/01/2021  WBC 4.0 - 10.5 K/uL 7.6 7.3 7.4  Hemoglobin 12.0 - 15.0 g/dL 8.4(L) 8.2(L) 8.2(L)  Hematocrit 36.0 - 46.0 % 26.3(L) 26.0(L) 26.0(L)  Platelets 150 - 400 K/uL 277 259 244    CMP Latest Ref Rng & Units 02/06/2021 02/04/2021 02/01/2021  Glucose 70 - 99 mg/dL 87 103(H) 107(H)  BUN 8 - 23 mg/dL 5(L) 7(L) 11  Creatinine 0.44 - 1.00 mg/dL 0.67 0.72 0.74  Sodium 135 - 145 mmol/L 138 140 139  Potassium 3.5 - 5.1 mmol/L 3.5 3.6 3.7  Chloride 98 - 111 mmol/L 103 106 107  CO2 22 - 32 mmol/L $RemoveB'28 30 27  '[id number]$ Calcium 8.9 - 10.3 mg/dL 8.7(L) 8.6(L) 8.5(L)  Total Protein 6.5 - 8.1 g/dL - - -  Total Bilirubin 0.3 - 1.2 mg/dL - - -  Alkaline Phos 38 - 126 U/L - - -  AST 15 - 41 U/L - - -  ALT 0 - 44 U/L - - -      Pertinent Microbiology Results for orders placed or performed during the hospital encounter of 01/27/21  Culture, blood (routine x 2)     Status: None   Collection Time: 01/27/21  6:33 PM   Specimen: BLOOD  Result Value Ref Range Status   Specimen Description BLOOD LEFT ANTECUBITAL  Final   Special Requests   Final    BOTTLES DRAWN AEROBIC AND ANAEROBIC Blood Culture adequate volume   Culture   Final    NO GROWTH 5 DAYS Performed at [facility], [place] [address] [place], [place] [address]    Report Status 02/01/2021 FINAL  Final  Resp Panel by RT-PCR (Flu A&B, Covid) Nasopharyngeal Swab     Status: None   Collection Time: 01/27/21  6:33 PM   Specimen: Nasopharyngeal Swab;  Nasopharyngeal(NP) swabs in vial transport medium  Result Value Ref Range Status   SARS Coronavirus 2 by RT PCR NEGATIVE NEGATIVE Final    Comment: (NOTE) SARS-CoV-2 target nucleic acids are NOT DETECTED.  The SARS-CoV-2 RNA is generally detectable in upper respiratory specimens during the acute phase of infection. The lowest concentration of SARS-CoV-2 viral copies this assay can detect is 138 copies/mL. A negative result does not preclude SARS-Cov-2 infection and should not be used as the sole basis for treatment or other patient management decisions. A negative result may occur with  improper specimen collection/handling, submission of specimen other than nasopharyngeal swab, presence of viral mutation(s) within the areas targeted by this assay, and inadequate number of viral copies(<138 copies/mL). A negative result must be combined with clinical observations, patient history, and epidemiological information. The expected result is Negative.  Fact Sheet for Patients:  EntrepreneurPulse.com.au  Fact Sheet for Healthcare Providers:  IncredibleEmployment.be  This test is no t yet approved or cleared by the [place] FDA and  has been authorized for detection and/or diagnosis of SARS-CoV-2 by FDA under an Emergency Use Authorization (EUA). This EUA will remain  in effect (meaning this test can be used) for the duration of the COVID-19 declaration under Section 564(b)(1) of the Act, 21 U.S.C.section 360bbb-3(b)(1), unless the authorization is terminated  or revoked sooner.       Influenza A by PCR NEGATIVE NEGATIVE Final   Influenza B by PCR NEGATIVE NEGATIVE Final    Comment: (NOTE) The Xpert Xpress SARS-CoV-2/FLU/RSV plus assay is intended as an aid in the diagnosis of influenza from Nasopharyngeal swab specimens and should not be used as a sole basis for treatment. Nasal washings and aspirates are unacceptable for Xpert Xpress  SARS-CoV-2/FLU/RSV testing.  Fact Sheet for Patients: EntrepreneurPulse.com.au  Fact Sheet for Healthcare Providers: IncredibleEmployment.be  This test is not yet approved or cleared by the [place] FDA and has been authorized for detection and/or diagnosis of SARS-CoV-2 by FDA under an Emergency Use Authorization (EUA). This EUA will remain in effect (meaning this test can be used) for the duration of the COVID-19 declaration under Section 564(b)(1) of the Act, 21 U.S.C. section 360bbb-3(b)(1), unless the authorization is terminated or revoked.  Performed at [facility], [place] [address], [place], [place] [address]   Urine culture     Status: Abnormal   Collection Time: 01/27/21  6:33 PM   Specimen: Urine, Random  Result Value Ref Range Status   Specimen Description URINE, RANDOM  Final   Special Requests   Final    NONE Performed at [facility], [place] [address], [place], [place] [address]    Culture MULTIPLE SPECIES PRESENT, SUGGEST RECOLLECTION (A)  Final   Report Status 01/29/2021 FINAL  Final  Aerobic/Anaerobic Culture (surgical/deep wound)     Status: None   Collection Time: 01/28/21 10:47 AM   Specimen: Fluid; Tissue  Result Value Ref Range Status   Specimen Description FLUID L1/L2 INTERVERTEBRAL DISC AND PARASPINAL  Final   Special Requests NONE  Final   Gram Stain   Final    MODERATE WBC PRESENT, PREDOMINANTLY PMN NO ORGANISMS SEEN    Culture   Final    RARE ESCHERICHIA COLI NO ANAEROBES ISOLATED Performed at [facility], [address], [place], [place] [address]    Report Status 02/02/2021 FINAL  Final   Organism ID, Bacteria ESCHERICHIA COLI  Final      Susceptibility   Escherichia coli - MIC*    AMPICILLIN 4 SENSITIVE Sensitive     CEFAZOLIN <=4 SENSITIVE Sensitive     CEFEPIME <=0.12 SENSITIVE Sensitive     CEFTAZIDIME <=1 SENSITIVE Sensitive     CEFTRIAXONE <=0.25 SENSITIVE Sensitive      CIPROFLOXACIN <=0.25 SENSITIVE Sensitive     GENTAMICIN <=1 SENSITIVE Sensitive     IMIPENEM <=0.25 SENSITIVE Sensitive     TRIMETH/SULFA <=20 SENSITIVE Sensitive     AMPICILLIN/SULBACTAM <=2 SENSITIVE Sensitive     PIP/TAZO <=4 SENSITIVE Sensitive     * RARE ESCHERICHIA COLI  Resp Panel by RT-PCR (Flu A&B, Covid) Nasopharyngeal Swab     Status: None   Collection Time: 02/07/21  8:52 AM   Specimen: Nasopharyngeal Swab; Nasopharyngeal(NP) swabs in vial transport medium  Result Value Ref Range Status   SARS Coronavirus 2 by RT PCR NEGATIVE NEGATIVE Final    Comment: (NOTE) SARS-CoV-2 target nucleic acids are NOT DETECTED.  The SARS-CoV-2 RNA is generally detectable in upper respiratory specimens during the acute phase of infection. The lowest concentration of SARS-CoV-2 viral copies this assay can detect is 138 copies/mL. A negative result does not preclude SARS-Cov-2 infection and should not be used as the sole basis for treatment or other patient management decisions. A negative result may occur with  improper specimen collection/handling, submission of specimen other than nasopharyngeal swab, presence of viral mutation(s) within the areas targeted by this assay, and inadequate number of viral copies(<138 copies/mL). A negative result must be combined with clinical observations, patient history, and epidemiological information. The expected result is Negative.  Fact Sheet for Patients:  EntrepreneurPulse.com.au  Fact Sheet for Healthcare Providers:  IncredibleEmployment.be  This test is no t yet approved or cleared by the [place] FDA and  has been authorized for detection and/or diagnosis of SARS-CoV-2 by FDA under an Emergency Use Authorization (EUA). This EUA will remain  in effect (meaning this test can be used) for the duration of the COVID-19 declaration under Section 564(b)(1) of the Act, 21 U.S.C.section 360bbb-3(b)(1), unless the  authorization is terminated  or revoked sooner.       Influenza A by PCR NEGATIVE NEGATIVE Final   Influenza B by PCR NEGATIVE NEGATIVE Final    Comment: (NOTE) The Xpert Xpress SARS-CoV-2/FLU/RSV plus assay is intended as an aid in the diagnosis of influenza from Nasopharyngeal swab specimens and should not be used as a sole basis for treatment. Nasal washings and aspirates are unacceptable for Xpert Xpress SARS-CoV-2/FLU/RSV testing.  Fact Sheet for  Patients: EntrepreneurPulse.com.au  Fact Sheet for Healthcare Providers: IncredibleEmployment.be  This test is not yet approved or cleared by the [place] FDA and has been authorized for detection and/or diagnosis of SARS-CoV-2 by FDA under an Emergency Use Authorization (EUA). This EUA will remain in effect (meaning this test can be used) for the duration of the COVID-19 declaration under Section 564(b)(1) of the Act, 21 U.S.C. section 360bbb-3(b)(1), unless the authorization is terminated or revoked.  Performed at [facility], [place] [address], [place], [place] [address]     Pertinent Imaging All pertinent labs/Imagings/notes reviewed. All pertinent plain films and CT images have been personally visualized and interpreted; radiology reports have been reviewed. Decision making incorporated into the Impression / Recommendations.  MRI Lumbar spine WO contrast 03/01/21 FINDINGS: Segmentation:  Standard.  Alignment: Small anterolisthesis at L3 over L4 and L4 over L5, unchanged.  Vertebrae: Erosive changes are again seen in the L1-2 endplates with progressive loss of intervertebral disc height. Vertebral body heights are maintained. No other level affected.  Conus medullaris and cauda equina: Conus extends to the L1 level. Conus and cauda equina appear normal.  Paraspinal and other soft tissues: Persistent edema of the psoas muscle with phlegmon/fluid collection extending  from the anterior epidural space to the left psoas muscles (series 11, image 14. This appear mildly improved, particularly the anterior epidural component which is better evaluated on the sagittal STIR sequence.  Disc levels:  T12-L1: Facet arthropathy. No spinal canal or neural foraminal stenosis.  L1-2: Evaluation of the spinal canal on the axial images at this level is limited by artifact. On the sagittal images, there appears to be moderate narrowing of the spinal canal related to bulging of disc collection and facet arthropathy. The this collection appear decreased in size when compared to prior MRI. The severe bilateral neural foraminal narrowing.  L2-3: Shallow disc bulge hypertrophic facet degenerative change without significant spinal canal or neural foraminal stenosis.  L3-4: Disc bulge/disc uncovering, prominent hypertrophic facet degenerative changes and ligamentum flavum redundancy resulting in moderate spinal canal stenosis mild bilateral neural foraminal narrowing. No significant change from prior.  L4-5: Disc bulge/disc uncovering and prominent hypertrophic facet degenerative changes resulting in mild spinal canal stenosis. No significant neural foraminal narrowing. No significant change from prior.  L5-S1: Shallow disc bulge and prominent hypertrophic facet degenerative change. No significant spinal canal or neural foraminal stenosis.  IMPRESSION: 1. Persistent edema of the bilateral psoas muscles with phlegmon/fluid collection extending from the anterior epidural space to the left psoas muscles. This appear mildly improved, particularly the anterior epidural component which is better seen on the sagittal STIR sequence. 2. Erosive changes in the L1-2 endplates with interval progressive loss of intervertebral disc height. 3. Moderate spinal canal stenosis at L1-L2 and L3-4 and mild spinal canal stenosis at L4-L5. 4. Severe bilateral neural foraminal  narrowing at L1-L2.   I have spent 30  minutes for this patient encounter including  review of prior medical records with greater than 50% of time in face to face counsel of the patient/discussing diagnostics and plan of care.   Electronically signed by:  Rosiland Oz, MD Infectious Disease Physician [facility] [address] [place],  [address] Phone: [phone]  Fax: [phone]

## 2021-03-28 ENCOUNTER — Ambulatory Visit (HOSPITAL_COMMUNITY)
Admission: RE | Admit: 2021-03-28 | Discharge: 2021-03-28 | Disposition: A | Discharge status: Home / Self Care | Payer: Medicare PPO | Source: Ambulatory Visit | Attending: Infectious Diseases | Admitting: Infectious Diseases

## 2021-03-28 ENCOUNTER — Other Ambulatory Visit: Payer: Self-pay

## 2021-03-28 DIAGNOSIS — M4646 Discitis, unspecified, lumbar region: Secondary | ICD-10-CM | POA: Insufficient documentation

## 2021-03-28 DIAGNOSIS — G061 Intraspinal abscess and granuloma: Secondary | ICD-10-CM | POA: Diagnosis not present

## 2021-03-28 DIAGNOSIS — N3289 Other specified disorders of bladder: Secondary | ICD-10-CM | POA: Diagnosis not present

## 2021-03-28 DIAGNOSIS — M47816 Spondylosis without myelopathy or radiculopathy, lumbar region: Secondary | ICD-10-CM | POA: Diagnosis not present

## 2021-04-10 ENCOUNTER — Telehealth: Payer: Self-pay

## 2021-04-10 ENCOUNTER — Ambulatory Visit (INDEPENDENT_AMBULATORY_CARE_PROVIDER_SITE_OTHER): Payer: Medicare PPO | Admitting: Infectious Diseases

## 2021-04-10 ENCOUNTER — Other Ambulatory Visit: Payer: Self-pay

## 2021-04-10 ENCOUNTER — Encounter: Payer: Self-pay | Admitting: Infectious Diseases

## 2021-04-10 VITALS — BP 148/79 | HR 66 | Temp 98.0°F

## 2021-04-10 DIAGNOSIS — Z5181 Encounter for therapeutic drug level monitoring: Secondary | ICD-10-CM | POA: Diagnosis not present

## 2021-04-10 DIAGNOSIS — M4646 Discitis, unspecified, lumbar region: Secondary | ICD-10-CM

## 2021-04-10 NOTE — Telephone Encounter (Signed)
Called and spoke with the Nurse Corrie Dandy  at  [facility]  transitional rehabilitation, gave Verbal order per [facility] to stop ceferolin  And start oral amoxicillin  1g  PO TID X 4 weeks , and  DC PICC Line.

## 2021-04-10 NOTE — Progress Notes (Signed)
[facility]                                                             [place], [place], [place], [address]                                                                  Phn. [phone]; Fax: [phone]                                                                             Date: 04/10/21  Reason for Referral: HFU for discitis and intra discal abscess   Assessment/plan Problem List Items Addressed This Visit      Musculoskeletal and Integument   Discitis/osteomyelitis of lumbar region - Primary   Relevant Orders   C-reactive protein   Sedimentation rate    Other Visit Diagnoses    Medication monitoring encounter         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 Coli. Repeat MRL L spine 5/26 with improved findings   Medication monitoring  Labs 03/22/21 ESR 41, CRP 19.2          04/05/21 ESR 31, CRP 18.8  Plan DC PICC line and IV cefazolin Start Amoxicillin 1g TID ( renal dosing) and fu in 3 weeks given elevated inflammatory markers Fu in 2 weeks   All questions and concerns were discussed and addressed. Patient verbalized understanding of the plan. ____________________________________________________________________________________________________________________  HPI: 70 year old female with past medical history of hypertension, hypothyroidism who is here for HFU after recent hospital admission 3/26-4/6 for Lumbar discitis/OM and possible intradiscal abscess extending into RT psoas. MRI LS 3/25 showed discitis/osteomyelitis at L1-L2 with paraspinal edema. Intradiscal abscess extending into the right psoas is not excluded.Evaluated by [facility] in the hospital and underwent CT CT-guided aspiration on 3/27 with cultures growing E. Coli.  Seen by ID, initially on daptomycin and ceftriaxone which was de-escalated to cefazolin after sensi's. Planned for 6 weeks, end date 03/11/21. She was discharged to a SNF on 02/07/21   02/14/21 Doing well. Back pain has improved from 10 to 4. Denies fevers, chills and sweats. Denies nausea, vomiting, abdominal pain and diarrhea. She is working with PT and is able to stand without support. She feels she is getting much stronger. Getting IV cefazolin as instructed. No issues with PICC line or new complains. She will follow up with me in 3 weeks before he end date of antibiotics after her MRI Lumbar spine is done. She verbalised understanding to the plan.   03/09/21 Doing well. Back pain is down to 1-2/10. She is able to walk with the help of walker. Discussed recent MRL  spine results and need to continue 2-3 more weeks of IV abx and plan to repeat MRI spine in 2 weeks before discontinuing antibiotics to which she agreed. No issues with antibiotics like nausea, vomiting, rashes and diarrhea. No issues with the PICC line. She is pleased with her progress and very appreciative of the tx.  04/10/21 Here for follow-up of lumbar discitis/osteomyelitis.  She has been getting IV cefazolin from her PICC line.  Denies any issues with the PICC line.  Denies any issues with the antibiotics like nausea vomiting diarrhea rashes.  Denies fevers chills and sweats.  Back pain is essentially improved, she has some discomfort when getting out of bed onto chair.  She is working with physical therapy and thinks her strength is getting back.  She is able to walk with the help of walker.  I discussed with her regarding her recent MRI lumbar spine findings and plan to DC PICC line and IV antibiotics and start p.o. amoxicillin.  She denies any allergies to penicillin in the past.  She will follow-up with me in 3 weeks.  ROS: 12 point ROS done with pertinent positives and negative listed above   Past Medical History:  Diagnosis Date  .  Complication of anesthesia    hard to wake up after eye surgery in 1970s  . Goiter    with thyroid nodule, FNA 09/32 benign follicular nodule  . Hypercholesterolemia   . Hypertension   . Hypothyroidism   . OA (osteoarthritis) of knee    mod.  Marland Kitchen Spondylolisthesis of lumbar region    Past Surgical History:  Procedure Laterality Date  . COLONOSCOPY    . EYE SURGERY    . THYROID SURGERY  1999   Current Outpatient Medications on File Prior to Visit  Medication Sig Dispense Refill  . amLODipine (NORVASC) 5 MG tablet Take 5 mg by mouth daily.    . ASPERCREME LIDOCAINE 4 % PTCH Apply 1 patch topically daily.    Marland Kitchen aspirin 81 MG chewable tablet Chew 81 mg by mouth every morning.    . carvedilol (COREG) 12.5 MG tablet Take 12.5 mg by mouth 2 (two) times daily.    . cholecalciferol (VITAMIN D) 1000 UNITS tablet Take 1,000 Units by mouth 2 (two) times daily.    . cyclobenzaprine (FLEXERIL) 5 MG tablet Take 5 mg by mouth 3 (three) times daily as needed for muscle spasms.    Marland Kitchen docusate sodium (COLACE) 100 MG capsule Take 100 mg by mouth 2 (two) times daily.    Marland Kitchen ezetimibe (ZETIA) 10 MG tablet Take 10 mg by mouth at bedtime.    . ferrous sulfate 325 (65 FE) MG tablet Take 325 mg by mouth daily with breakfast.    . gabapentin (NEURONTIN) 100 MG capsule Take 1 capsule (100 mg total) by mouth 3 (three) times daily. (Patient taking differently: Take 200 mg by mouth 3 (three) times daily.) 21 capsule 0  . Heparin Lock Flush (HEPARIN FLUSH) 10 UNIT/ML SOLN injection Inject 10 Units into the vein See admin instructions. Every 8 hours, start date 02/07/21 end date 03/15/21    . Infant Care Products [facility]) OINT Apply 1 application topically daily.    Marland Kitchen levothyroxine (SYNTHROID, LEVOTHROID) 100 MCG tablet Take 100 mcg by mouth daily at 6 (six) AM.    . Magnesium 250 MG TABS Take 500 mg by mouth every morning.    . olmesartan-hydrochlorothiazide (BENICAR HCT) 40-25 MG tablet Take 1 tablet by mouth daily.     Marland Kitchen  oxyCODONE (ROXICODONE) 5 MG immediate release tablet Take 1 tablet (5 mg total) by mouth every 6 (six) hours as needed for severe pain. 15 tablet 0  . PRESCRIPTION MEDICATION Inject into the vein See admin instructions. Normal saline flush- 2 flushes  IV q 8 hours PICC line, using SASH method for ABT therapy    . Skin Protectants, Misc. (MINERIN CREME) CREA Apply 1 application topically daily.    . tamsulosin (FLOMAX) 0.4 MG CAPS capsule Take 1 capsule (0.4 mg total) by mouth daily after breakfast. 30 capsule   . tetrahydrozoline 0.05 % ophthalmic solution Place 1 drop into both eyes daily as needed (dry eyes).    Marland Kitchen tiZANidine (ZANAFLEX) 2 MG tablet Take 1 tablet (2 mg total) by mouth every 8 (eight) hours as needed for muscle spasms. 21 tablet 0  . Turmeric 500 MG CAPS Take 1 capsule by mouth 2 (two) times daily.    . vitamin C (ASCORBIC ACID) 500 MG tablet Take 500 mg by mouth daily.     No current facility-administered medications on file prior to visit.    Allergies  Allergen Reactions  . Atorvastatin     Other reaction(s): muscle aches  . Influenza Vac Recombinant Ha Trivalent [Influenza Vaccine Recombinant]     Other reaction(s): severe reaction in 1970s--skin pealed  . Rosuvastatin     Other reaction(s): muslce aches    Social History   Socioeconomic History  . Marital status: Divorced    Spouse name: Not on file  . Number of children: Not on file  . Years of education: Not on file  . Highest education level: Not on file  Occupational History  . Not on file  Tobacco Use  . Smoking status: Never Smoker  . Smokeless tobacco: Never Used  Vaping Use  . Vaping Use: Never used  Substance and Sexual Activity  . Alcohol use: No  . Drug use: No  . Sexual activity: Not on file  Other Topics Concern  . Not on file  Social History Narrative  . Not on file   Social Determinants of Health   Financial Resource Strain: Not on file  Food Insecurity: Not on file   Transportation Needs: Not on file  Physical Activity: Not on file  Stress: Not on file  Social Connections: Not on file  Intimate Partner Violence: Not on file    Vitals BP (!) 148/79   Pulse 66   Temp 98 F (36.7 C)   SpO2 98% '  Examination  General - not in acute distress, comfortably sitting in  [place], no pallor and no icterus Chest - b/l clear air entry, no additional sounds CVS- Normal s1s2, RRR Abdomen - Soft, Non tender , non distended Ext- no pedal edema Neuro: grossly normal Back - WNL, no vertebral tenderness, brace at the back  Psych : calm and cooperative   Recent labs CBC Latest Ref Rng & Units 02/06/2021 02/04/2021 02/01/2021  WBC 4.0 - 10.5 K/uL 7.6 7.3 7.4  Hemoglobin 12.0 - 15.0 g/dL 8.4(L) 8.2(L) 8.2(L)  Hematocrit 36.0 - 46.0 % 26.3(L) 26.0(L) 26.0(L)  Platelets 150 - 400 K/uL 277 259 244    CMP Latest Ref Rng & Units 02/06/2021 02/04/2021 02/01/2021  Glucose 70 - 99 mg/dL 87 103(H) 107(H)  BUN 8 - 23 mg/dL 5(L) 7(L) 11  Creatinine 0.44 - 1.00 mg/dL 0.67 0.72 0.74  Sodium 135 - 145 mmol/L 138 140 139  Potassium 3.5 - 5.1 mmol/L  3.5 3.6 3.7  Chloride 98 - 111 mmol/L 103 106 107  CO2 22 - 32 mmol/L $RemoveB'28 30 27  'nOvlIfxi$ Calcium 8.9 - 10.3 mg/dL 8.7(L) 8.6(L) 8.5(L)  Total Protein 6.5 - 8.1 g/dL - - -  Total Bilirubin 0.3 - 1.2 mg/dL - - -  Alkaline Phos 38 - 126 U/L - - -  AST 15 - 41 U/L - - -  ALT 0 - 44 U/L - - -    Pertinent Microbiology Results for orders placed or performed during the hospital encounter of 01/27/21  Culture, blood (routine x 2)     Status: None   Collection Time: 01/27/21  6:33 PM   Specimen: BLOOD  Result Value Ref Range Status   Specimen Description BLOOD LEFT ANTECUBITAL  Final   Special Requests   Final    BOTTLES DRAWN AEROBIC AND ANAEROBIC Blood Culture adequate volume   Culture   Final    NO GROWTH 5 DAYS Performed at [facility], [place] [address], [place], [place] [address]    Report Status  02/01/2021 FINAL  Final  Resp Panel by RT-PCR (Flu A&B, Covid) Nasopharyngeal Swab     Status: None   Collection Time: 01/27/21  6:33 PM   Specimen: Nasopharyngeal Swab; Nasopharyngeal(NP) swabs in vial transport medium  Result Value Ref Range Status   SARS Coronavirus 2 by RT PCR NEGATIVE NEGATIVE Final    Comment: (NOTE) SARS-CoV-2 target nucleic acids are NOT DETECTED.  The SARS-CoV-2 RNA is generally detectable in upper respiratory specimens during the acute phase of infection. The lowest concentration of SARS-CoV-2 viral copies this assay can detect is 138 copies/mL. A negative result does not preclude SARS-Cov-2 infection and should not be used as the sole basis for treatment or other patient management decisions. A negative result may occur with  improper specimen collection/handling, submission of specimen other than nasopharyngeal swab, presence of viral mutation(s) within the areas targeted by this assay, and inadequate number of viral copies(<138 copies/mL). A negative result must be combined with clinical observations, patient history, and epidemiological information. The expected result is Negative.  Fact Sheet for Patients:  EntrepreneurPulse.com.au  Fact Sheet for Healthcare Providers:  IncredibleEmployment.be  This test is no t yet approved or cleared by the [place] FDA and  has been authorized for detection and/or diagnosis of SARS-CoV-2 by FDA under an Emergency Use Authorization (EUA). This EUA will remain  in effect (meaning this test can be used) for the duration of the COVID-19 declaration under Section 564(b)(1) of the Act, 21 U.S.C.section 360bbb-3(b)(1), unless the authorization is terminated  or revoked sooner.       Influenza A by PCR NEGATIVE NEGATIVE Final   Influenza B by PCR NEGATIVE NEGATIVE Final    Comment: (NOTE) The Xpert Xpress SARS-CoV-2/FLU/RSV plus assay is intended as an aid in the diagnosis of  influenza from Nasopharyngeal swab specimens and should not be used as a sole basis for treatment. Nasal washings and aspirates are unacceptable for Xpert Xpress SARS-CoV-2/FLU/RSV testing.  Fact Sheet for Patients: EntrepreneurPulse.com.au  Fact Sheet for Healthcare Providers: IncredibleEmployment.be  This test is not yet approved or cleared by the [place] FDA and has been authorized for detection and/or diagnosis of SARS-CoV-2 by FDA under an Emergency Use Authorization (EUA). This EUA will remain in effect (meaning this test can be used) for the duration of the COVID-19 declaration under Section 564(b)(1) of the Act, 21 U.S.C. section 360bbb-3(b)(1), unless the authorization is terminated or revoked.  Performed at [facility], [address], [place], [place] [address]   Urine culture     Status: Abnormal   Collection Time: 01/27/21  6:33 PM   Specimen: Urine, Random  Result Value Ref Range Status   Specimen Description URINE, RANDOM  Final   Special Requests   Final    NONE Performed at [facility], [address] [place], [place] [address]    Culture MULTIPLE SPECIES PRESENT, SUGGEST RECOLLECTION (A)  Final   Report Status 01/29/2021 FINAL  Final  Aerobic/Anaerobic Culture (surgical/deep wound)     Status: None   Collection Time: 01/28/21 10:47 AM   Specimen: Fluid; Tissue  Result Value Ref Range Status   Specimen Description FLUID L1/L2 INTERVERTEBRAL DISC AND PARASPINAL  Final   Special Requests NONE  Final   Gram Stain   Final    MODERATE WBC PRESENT, PREDOMINANTLY PMN NO ORGANISMS SEEN    Culture   Final    RARE ESCHERICHIA COLI NO ANAEROBES ISOLATED Performed at [facility], [address], [place], [place] [address]    Report Status 02/02/2021 FINAL  Final   Organism ID, Bacteria ESCHERICHIA COLI  Final      Susceptibility   Escherichia coli - MIC*    AMPICILLIN 4 SENSITIVE Sensitive      CEFAZOLIN <=4 SENSITIVE Sensitive     CEFEPIME <=0.12 SENSITIVE Sensitive     CEFTAZIDIME <=1 SENSITIVE Sensitive     CEFTRIAXONE <=0.25 SENSITIVE Sensitive     CIPROFLOXACIN <=0.25 SENSITIVE Sensitive     GENTAMICIN <=1 SENSITIVE Sensitive     IMIPENEM <=0.25 SENSITIVE Sensitive     TRIMETH/SULFA <=20 SENSITIVE Sensitive     AMPICILLIN/SULBACTAM <=2 SENSITIVE Sensitive     PIP/TAZO <=4 SENSITIVE Sensitive     * RARE ESCHERICHIA COLI  Resp Panel by RT-PCR (Flu A&B, Covid) Nasopharyngeal Swab     Status: None   Collection Time: 02/07/21  8:52 AM   Specimen: Nasopharyngeal Swab; Nasopharyngeal(NP) swabs in vial transport medium  Result Value Ref Range Status   SARS Coronavirus 2 by RT PCR NEGATIVE NEGATIVE Final    Comment: (NOTE) SARS-CoV-2 target nucleic acids are NOT DETECTED.  The SARS-CoV-2 RNA is generally detectable in upper respiratory specimens during the acute phase of infection. The lowest concentration of SARS-CoV-2 viral copies this assay can detect is 138 copies/mL. A negative result does not preclude SARS-Cov-2 infection and should not be used as the sole basis for treatment or other patient management decisions. A negative result may occur with  improper specimen collection/handling, submission of specimen other than nasopharyngeal swab, presence of viral mutation(s) within the areas targeted by this assay, and inadequate number of viral copies(<138 copies/mL). A negative result must be combined with clinical observations, patient history, and epidemiological information. The expected result is Negative.  Fact Sheet for Patients:  BloggerCourse.com  Fact Sheet for Healthcare Providers:  SeriousBroker.it  This test is no t yet approved or cleared by the [place] FDA and  has been authorized for detection and/or diagnosis of SARS-CoV-2 by FDA under an Emergency Use Authorization (EUA). This EUA will remain  in  effect (meaning this test can be used) for the duration of the COVID-19 declaration under Section 564(b)(1) of the Act, 21 U.S.C.section 360bbb-3(b)(1), unless the authorization is terminated  or revoked sooner.       Influenza A by PCR NEGATIVE NEGATIVE Final   Influenza B by PCR NEGATIVE NEGATIVE Final  Comment: (NOTE) The Xpert Xpress SARS-CoV-2/FLU/RSV plus assay is intended as an aid in the diagnosis of influenza from Nasopharyngeal swab specimens and should not be used as a sole basis for treatment. Nasal washings and aspirates are unacceptable for Xpert Xpress SARS-CoV-2/FLU/RSV testing.  Fact Sheet for Patients: EntrepreneurPulse.com.au  Fact Sheet for Healthcare Providers: IncredibleEmployment.be  This test is not yet approved or cleared by the [place] FDA and has been authorized for detection and/or diagnosis of SARS-CoV-2 by FDA under an Emergency Use Authorization (EUA). This EUA will remain in effect (meaning this test can be used) for the duration of the COVID-19 declaration under Section 564(b)(1) of the Act, 21 U.S.C. section 360bbb-3(b)(1), unless the authorization is terminated or revoked.  Performed at [facility], [place] [address] [place], [place] [address]     Pertinent Imaging All pertinent labs/Imagings/notes reviewed. All pertinent plain films and CT images have been personally visualized and interpreted; radiology reports have been reviewed. Decision making incorporated into the Impression / Recommendations.  MRI Lumbar spine WO contrast 03/29/21   FINDINGS: Segmentation: Standard segmentation is assumed. The inferior-most fully formed intervertebral disc is labeled L5-S1.  Alignment:  Mild anterolisthesis at L3-L4 and L4-L5, similar.  Vertebrae: Erosive changes are again seen at the L1-L2 endplates. Previously seen marrow edema involving the L1-L2 vertebral bodies is improved.  Discitis/osteomyelitis at this level is further discussed below.  Conus medullaris and cauda equina: Conus extends to the L1 level. Conus appears unremarkable.  Paraspinal and other soft tissues: Improved psoas edema at L1-L2, as detailed below. Left renal cyst. Nonspecific mild circumferential bladder wall thickening, partially imaged.  Disc levels:  T12-L1: Mild facet hypertrophy and right paracentral disc protrusion without significant canal or foraminal stenosis.  L1-L2: At L1-L2 there is improved abnormal STIR hyperintense signal within the disc space, compatible with improving discitis. As seen on the priors, there is extension of abnormal STIR hyperintensity into the left psoas musculature (series 7, image 12; series 8, image 9) with improved surrounding psoas edema. 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 Moderate bilateral foraminal stenosis.  L2-L3: Similar disc bulge and moderate bilateral facet hypertrophy with ligamentum flavum thickening. Similar mild bilateral foraminal stenosis and mild canal stenosis.  L3-L4: Similar grade 1 anterolisthesis of L3 on L4. Similar marked facet hypertrophy with moderate canal stenosis and mild bilateral foraminal stenosis.  L4-L5: Similar grade 1 anterolisthesis with uncovering of the disc. Similar marked bilateral facet hypertrophy. Similar mild to moderate canal stenosis and mild bilateral foraminal stenosis.  L5-S1: Severe right and moderate left facet hypertrophy. No significant canal or foraminal stenosis.  IMPRESSION: 1. Continued improvement in discitis/osteomyelitis at L1-L2. There is improved psoas edema/phlegmon with persistent edema in the disc that extends from the disc space into the adjacent left psoas (intradiscal abscess here is not excluded). Ventral epidural extension of infection is also improved with improved (now mild) canal stenosis at this  level. Postcontrast imaging could further characterize these findings if clinically indicated. 2. Similar multilevel degenerative change, detailed above and including moderate canal stenosis at L3-L4. 3. Nonspecific mild circumferential bladder wall thickening, partially imaged.  I have spent 30  minutes for this patient encounter including  review of prior medical records with greater than 50% of time in face to face counsel of the patient/discussing diagnostics and plan of care.   Electronically signed by:  Rosiland Oz, MD Infectious Disease Physician [facility] [address] [place], [place] [address] Phone: [phone]  Fax: [phone]

## 2021-04-14 DIAGNOSIS — D649 Anemia, unspecified: Secondary | ICD-10-CM | POA: Diagnosis not present

## 2021-04-14 DIAGNOSIS — M4646 Discitis, unspecified, lumbar region: Secondary | ICD-10-CM | POA: Diagnosis not present

## 2021-04-14 DIAGNOSIS — M179 Osteoarthritis of knee, unspecified: Secondary | ICD-10-CM | POA: Diagnosis not present

## 2021-04-14 DIAGNOSIS — M4626 Osteomyelitis of vertebra, lumbar region: Secondary | ICD-10-CM | POA: Diagnosis not present

## 2021-04-14 DIAGNOSIS — E039 Hypothyroidism, unspecified: Secondary | ICD-10-CM | POA: Diagnosis not present

## 2021-04-14 DIAGNOSIS — E7800 Pure hypercholesterolemia, unspecified: Secondary | ICD-10-CM | POA: Diagnosis not present

## 2021-04-14 DIAGNOSIS — E049 Nontoxic goiter, unspecified: Secondary | ICD-10-CM | POA: Diagnosis not present

## 2021-04-14 DIAGNOSIS — I10 Essential (primary) hypertension: Secondary | ICD-10-CM | POA: Diagnosis not present

## 2021-04-14 DIAGNOSIS — B9620 Unspecified Escherichia coli [E. coli] as the cause of diseases classified elsewhere: Secondary | ICD-10-CM | POA: Diagnosis not present

## 2021-04-18 DIAGNOSIS — E7800 Pure hypercholesterolemia, unspecified: Secondary | ICD-10-CM | POA: Diagnosis not present

## 2021-04-18 DIAGNOSIS — M179 Osteoarthritis of knee, unspecified: Secondary | ICD-10-CM | POA: Diagnosis not present

## 2021-04-18 DIAGNOSIS — B9620 Unspecified Escherichia coli [E. coli] as the cause of diseases classified elsewhere: Secondary | ICD-10-CM | POA: Diagnosis not present

## 2021-04-18 DIAGNOSIS — D649 Anemia, unspecified: Secondary | ICD-10-CM | POA: Diagnosis not present

## 2021-04-18 DIAGNOSIS — E049 Nontoxic goiter, unspecified: Secondary | ICD-10-CM | POA: Diagnosis not present

## 2021-04-18 DIAGNOSIS — M4646 Discitis, unspecified, lumbar region: Secondary | ICD-10-CM | POA: Diagnosis not present

## 2021-04-18 DIAGNOSIS — I10 Essential (primary) hypertension: Secondary | ICD-10-CM | POA: Diagnosis not present

## 2021-04-18 DIAGNOSIS — M4626 Osteomyelitis of vertebra, lumbar region: Secondary | ICD-10-CM | POA: Diagnosis not present

## 2021-04-18 DIAGNOSIS — E039 Hypothyroidism, unspecified: Secondary | ICD-10-CM | POA: Diagnosis not present

## 2021-04-20 DIAGNOSIS — M4626 Osteomyelitis of vertebra, lumbar region: Secondary | ICD-10-CM | POA: Diagnosis not present

## 2021-04-20 DIAGNOSIS — M179 Osteoarthritis of knee, unspecified: Secondary | ICD-10-CM | POA: Diagnosis not present

## 2021-04-20 DIAGNOSIS — E7800 Pure hypercholesterolemia, unspecified: Secondary | ICD-10-CM | POA: Diagnosis not present

## 2021-04-20 DIAGNOSIS — E049 Nontoxic goiter, unspecified: Secondary | ICD-10-CM | POA: Diagnosis not present

## 2021-04-20 DIAGNOSIS — E039 Hypothyroidism, unspecified: Secondary | ICD-10-CM | POA: Diagnosis not present

## 2021-04-20 DIAGNOSIS — B9620 Unspecified Escherichia coli [E. coli] as the cause of diseases classified elsewhere: Secondary | ICD-10-CM | POA: Diagnosis not present

## 2021-04-20 DIAGNOSIS — I10 Essential (primary) hypertension: Secondary | ICD-10-CM | POA: Diagnosis not present

## 2021-04-20 DIAGNOSIS — D649 Anemia, unspecified: Secondary | ICD-10-CM | POA: Diagnosis not present

## 2021-04-20 DIAGNOSIS — M4646 Discitis, unspecified, lumbar region: Secondary | ICD-10-CM | POA: Diagnosis not present

## 2021-04-23 DIAGNOSIS — M4646 Discitis, unspecified, lumbar region: Secondary | ICD-10-CM | POA: Diagnosis not present

## 2021-04-24 ENCOUNTER — Other Ambulatory Visit: Payer: Self-pay

## 2021-04-24 ENCOUNTER — Ambulatory Visit (INDEPENDENT_AMBULATORY_CARE_PROVIDER_SITE_OTHER): Payer: Medicare PPO | Admitting: Infectious Diseases

## 2021-04-24 ENCOUNTER — Encounter: Payer: Self-pay | Admitting: Infectious Diseases

## 2021-04-24 VITALS — BP 156/77 | HR 54 | Wt 260.8 lb

## 2021-04-24 DIAGNOSIS — M4646 Discitis, unspecified, lumbar region: Secondary | ICD-10-CM | POA: Diagnosis not present

## 2021-04-24 MED ORDER — AMOXICILLIN 500 MG PO CAPS: 1000.0000 mg | ORAL_CAPSULE | Freq: Three times a day (TID) | ORAL | 0 refills | Status: DC

## 2021-04-24 NOTE — Progress Notes (Signed)
[facility]                                                             [address], [place], [place], [address]                                                                  Phn. 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 Coli. Repeat MRL L spine 5/26 with improved findings  Completed IV cefazolin on 6/7 and started on amoxicillin  Medication monitoring    Plan Continue Amoxicillin 1g TID ( renal dosing)  Fu and duration to be determined based on labs from today   All questions and concerns were discussed and addressed. Patient verbalized understanding of the plan. ____________________________________________________________________________________________________________________  HPI: 70 year old female with past medical history of hypertension, hypothyroidism who is here for HFU after recent hospital admission 3/26-4/6 for Lumbar discitis/OM and possible intradiscal abscess extending into RT psoas. MRI LS 3/25 showed discitis/osteomyelitis at L1-L2 with paraspinal edema. Intradiscal abscess extending into the right psoas is not excluded.Evaluated by [facility] in the hospital and underwent CT CT-guided aspiration on 3/27 with cultures growing E. Coli. Seen by ID, initially on daptomycin and ceftriaxone which was de-escalated to cefazolin after sensi's. Planned for 6 weeks, end date 03/11/21. She was discharged to a SNF on 02/07/21   02/14/21 Doing well. Back pain has improved from 10 to 4. Denies fevers, chills and sweats. Denies nausea, vomiting, abdominal  pain and diarrhea. She is working with PT and is able to stand without support. She feels she is getting much stronger. Getting IV cefazolin as instructed. No issues with PICC line or new complains. She will follow up with me in 3 weeks before he end date of antibiotics after her MRI Lumbar spine is done. She verbalised understanding to the plan.   03/09/21 Doing well. Back pain is down to 1-2/10. She is able to walk with the help of walker. Discussed recent MRL spine results and need to continue 2-3 more weeks of IV abx and plan to repeat MRI spine in 2 weeks before discontinuing antibiotics to which she agreed. No issues with antibiotics like nausea, vomiting, rashes and diarrhea. No issues with the PICC line. She is pleased with her progress and very appreciative of the tx.  04/10/21 Here for follow-up of lumbar discitis/osteomyelitis.  She has been getting IV cefazolin  from her PICC line.  Denies any issues with the PICC line.  Denies any issues with the antibiotics like nausea vomiting diarrhea rashes.  Denies fevers chills and sweats.  Back pain is essentially improved, she has some discomfort when getting out of bed onto chair.  She is working with physical therapy and thinks her strength is getting back.  She is able to walk with the help of walker.  I discussed with her regarding her recent MRI lumbar spine findings and plan to DC PICC line and IV antibiotics and start p.o. amoxicillin.  She denies any allergies to penicillin in the past.  She will follow-up with me in 3 weeks.  04/24/21 Here for follow-up of lumbar discitis/osteomyelitis.  She is taking amoxicillin orally 1 g 3 times a day.  Denies missing any doses.  Denies any side effects with antibiotics like nausea vomiting abdominal diarrhea or rashes.  Denies any fever chills and sweats.  Back pain has improved to 2 out of 10.  She is able to walk briefly without the help of walker and without support.  She is discharged to home from her  facility.  She is happy with her progress and denies any complaints today.  ROS: 12 point ROS done with pertinent positives and negative listed above   Past Medical History:  Diagnosis Date   Complication of anesthesia    hard to wake up after eye surgery in 1970s   Goiter    with thyroid nodule, FNA 12/15 benign follicular nodule   Hypercholesterolemia    Hypertension    Hypothyroidism    OA (osteoarthritis) of knee    mod.   Spondylolisthesis of lumbar region    Past Surgical History:  Procedure Laterality Date   COLONOSCOPY     EYE SURGERY     THYROID SURGERY  1999   Current Outpatient Medications on File Prior to Visit  Medication Sig Dispense Refill   amLODipine (NORVASC) 5 MG tablet Take 5 mg by mouth daily.     ASPERCREME LIDOCAINE 4 % PTCH Apply 1 patch topically daily.     aspirin 81 MG chewable tablet Chew 81 mg by mouth every morning.     carvedilol (COREG) 12.5 MG tablet Take 12.5 mg by mouth 2 (two) times daily.     cholecalciferol (VITAMIN D) 1000 UNITS tablet Take 1,000 Units by mouth 2 (two) times daily.     cyclobenzaprine (FLEXERIL) 5 MG tablet Take 5 mg by mouth 3 (three) times daily as needed for muscle spasms.     docusate sodium (COLACE) 100 MG capsule Take 100 mg by mouth 2 (two) times daily.     ezetimibe (ZETIA) 10 MG tablet Take 10 mg by mouth at bedtime.     ferrous sulfate 325 (65 FE) MG tablet Take 325 mg by mouth daily with breakfast.     gabapentin (NEURONTIN) 100 MG capsule Take 1 capsule (100 mg total) by mouth 3 (three) times daily. (Patient taking differently: Take 200 mg by mouth 3 (three) times daily.) 21 capsule 0   Heparin Lock Flush (HEPARIN FLUSH) 10 UNIT/ML SOLN injection Inject 10 Units into the vein See admin instructions. Every 8 hours, start date 02/07/21 end date 03/15/21     Infant Care Products [facility]) OINT Apply 1 application topically daily.     levothyroxine (SYNTHROID, LEVOTHROID) 100 MCG tablet Take 100 mcg by mouth daily at  6 (six) AM.     Magnesium 250 MG TABS Take 500 mg by  mouth every morning.     olmesartan-hydrochlorothiazide (BENICAR HCT) 40-25 MG tablet Take 1 tablet by mouth daily.     oxyCODONE (ROXICODONE) 5 MG immediate release tablet Take 1 tablet (5 mg total) by mouth every 6 (six) hours as needed for severe pain. 15 tablet 0   PRESCRIPTION MEDICATION Inject into the vein See admin instructions. Normal saline flush- 2 flushes  IV q 8 hours PICC line, using SASH method for ABT therapy     Skin Protectants, Misc. (MINERIN CREME) CREA Apply 1 application topically daily.     tamsulosin (FLOMAX) 0.4 MG CAPS capsule Take 1 capsule (0.4 mg total) by mouth daily after breakfast. 30 capsule    tetrahydrozoline 0.05 % ophthalmic solution Place 1 drop into both eyes daily as needed (dry eyes).     tiZANidine (ZANAFLEX) 2 MG tablet Take 1 tablet (2 mg total) by mouth every 8 (eight) hours as needed for muscle spasms. 21 tablet 0   Turmeric 500 MG CAPS Take 1 capsule by mouth 2 (two) times daily.     vitamin C (ASCORBIC ACID) 500 MG tablet Take 500 mg by mouth daily.     No current facility-administered medications on file prior to visit.      Allergies  Allergen Reactions   Atorvastatin     Other reaction(s): muscle aches   Influenza Vac Recombinant Ha Trivalent [Influenza Vaccine Recombinant]     Other reaction(s): severe reaction in 1970s--skin pealed   Rosuvastatin     Other reaction(s): muslce aches    Social History   Socioeconomic History   Marital status: Divorced    Spouse name: Not on file   Number of children: Not on file   Years of education: Not on file   Highest education level: Not on file  Occupational History   Not on file  Tobacco Use   Smoking status: Never   Smokeless tobacco: Never  Vaping Use   Vaping Use: Never used  Substance and Sexual Activity   Alcohol use: No   Drug use: No   Sexual activity: Not on file  Other Topics Concern   Not on file  Social History  Narrative   Not on file   Social Determinants of Health   Financial Resource Strain: Not on file  Food Insecurity: Not on file  Transportation Needs: Not on file  Physical Activity: Not on file  Stress: Not on file  Social Connections: Not on file  Intimate Partner Violence: Not on file    Vitals BP (!) 156/77   Pulse (!) 54   Wt 260 lb 12.8 oz (118.3 kg)   BMI 42.09 kg/m    Examination  General - not in acute distress, comfortably sitting in  WHEEL CHAIR  HEENT - PEERLA, no pallor and no icterus Chest - b/l clear air entry, no additional sounds CVS- Normal s1s2, RRR Abdomen - Soft, Non tender , non distended Ext- no pedal edema Neuro: grossly normal Back - WNL, no vertebral tenderness, brace at the back  Psych : calm and cooperative   Recent labs CBC Latest Ref Rng & Units 02/06/2021 02/04/2021 02/01/2021  WBC 4.0 - 10.5 K/uL 7.6 7.3 7.4  Hemoglobin 12.0 - 15.0 g/dL 3.5(K) 5.6(Y) 5.6(L)  Hematocrit 36.0 - 46.0 % 26.3(L) 26.0(L) 26.0(L)  Platelets 150 - 400 K/uL 277 259 244    CMP Latest Ref Rng & Units 02/06/2021 02/04/2021 02/01/2021  Glucose 70 - 99 mg/dL 87 893(T) 342(A)  BUN 8 - 23  mg/dL 5(L) 7(L) 11  Creatinine 0.44 - 1.00 mg/dL 0.53 9.76 7.34  Sodium 135 - 145 mmol/L 138 140 139  Potassium 3.5 - 5.1 mmol/L 3.5 3.6 3.7  Chloride 98 - 111 mmol/L 103 106 107  CO2 22 - 32 mmol/L 28 30 27   Calcium 8.9 - 10.3 mg/dL ) 1.9(F) 7.9(K)  Total Protein 6.5 - 8.1 g/dL - - -  Total Bilirubin 0.3 - 1.2 mg/dL - - -  Alkaline Phos 38 - 126 U/L - - -  AST 15 - 41 U/L - - -  ALT 0 - 44 U/L - - -    Pertinent Microbiology Results for orders placed or performed during the hospital encounter of 01/27/21  Culture, blood (routine x 2)     Status: None   Collection Time: 01/27/21  6:33 PM   Specimen: BLOOD  Result Value Ref Range Status   Specimen Description BLOOD LEFT ANTECUBITAL  Final   Special Requests   Final    BOTTLES DRAWN AEROBIC AND ANAEROBIC Blood Culture  adequate volume   Culture   Final    NO GROWTH 5 DAYS Performed at [facility], [address], [place], [place]    Report Status 02/01/2021 FINAL  Final  Resp Panel by RT-PCR (Flu A&B, Covid) Nasopharyngeal Swab     Status: None   Collection Time: 01/27/21  6:33 PM   Specimen: Nasopharyngeal Swab; Nasopharyngeal(NP) swabs in vial transport medium  Result Value Ref Range Status   SARS Coronavirus 2 by RT PCR NEGATIVE NEGATIVE Final    Comment: (NOTE) SARS-CoV-2 target nucleic acids are NOT DETECTED.  The SARS-CoV-2 RNA is generally detectable in upper respiratory specimens during the acute phase of infection. The lowest concentration of SARS-CoV-2 viral copies this assay can detect is 138 copies/mL. A negative result does not preclude SARS-Cov-2 infection and should not be used as the sole basis for treatment or other patient management decisions. A negative result may occur with  improper specimen collection/handling, submission of specimen other than nasopharyngeal swab, presence of viral mutation(s) within the areas targeted by this assay, and inadequate number of viral copies(<138 copies/mL). A negative result must be combined with clinical observations, patient history, and epidemiological information. The expected result is Negative.  Fact Sheet for Patients:  01/29/21  Fact Sheet for Healthcare Providers:  BloggerCourse.com  This test is no t yet approved or cleared by the [place] FDA and  has been authorized for detection and/or diagnosis of SARS-CoV-2 by FDA under an Emergency Use Authorization (EUA). This EUA will remain  in effect (meaning this test can be used) for the duration of the COVID-19 declaration under Section 564(b)(1) of the Act, 21 U.S.C.section 360bbb-3(b)(1), unless the authorization is terminated  or revoked sooner.       Influenza A by PCR NEGATIVE NEGATIVE Final    Influenza B by PCR NEGATIVE NEGATIVE Final    Comment: (NOTE) The Xpert Xpress SARS-CoV-2/FLU/RSV plus assay is intended as an aid in the diagnosis of influenza from Nasopharyngeal swab specimens and should not be used as a sole basis for treatment. Nasal washings and aspirates are unacceptable for Xpert Xpress SARS-CoV-2/FLU/RSV testing.  Fact Sheet for Patients: [place]  Fact Sheet for Healthcare Providers: BloggerCourse.com  This test is not yet approved or cleared by the [place] FDA and has been authorized for detection and/or diagnosis of SARS-CoV-2 by FDA under an Emergency Use Authorization (EUA). This EUA will remain in effect (meaning this  test can be used) for the duration of the COVID-19 declaration under Section 564(b)(1) of the Act, 21 U.S.C. section 360bbb-3(b)(1), unless the authorization is terminated or revoked.  Performed at [facility], [address] [place], [place] [address]   Urine culture     Status: Abnormal   Collection Time: 01/27/21  6:33 PM   Specimen: Urine, Random  Result Value Ref Range Status   Specimen Description URINE, RANDOM  Final   Special Requests   Final    NONE Performed at [facility], [address], [place], [place] [address]    Culture MULTIPLE SPECIES PRESENT, SUGGEST RECOLLECTION (A)  Final   Report Status 01/29/2021 FINAL  Final  Aerobic/Anaerobic Culture (surgical/deep wound)     Status: None   Collection Time: 01/28/21 10:47 AM   Specimen: Fluid; Tissue  Result Value Ref Range Status   Specimen Description FLUID L1/L2 INTERVERTEBRAL DISC AND PARASPINAL  Final   Special Requests NONE  Final   Gram Stain   Final    MODERATE WBC PRESENT, PREDOMINANTLY PMN NO ORGANISMS SEEN    Culture   Final    RARE ESCHERICHIA COLI NO ANAEROBES ISOLATED Performed at [facility], [address], [place], [place] [address]    Report Status 02/02/2021  FINAL  Final   Organism ID, Bacteria ESCHERICHIA COLI  Final      Susceptibility   Escherichia coli - MIC*    AMPICILLIN 4 SENSITIVE Sensitive     CEFAZOLIN <=4 SENSITIVE Sensitive     CEFEPIME <=0.12 SENSITIVE Sensitive     CEFTAZIDIME <=1 SENSITIVE Sensitive     CEFTRIAXONE <=0.25 SENSITIVE Sensitive     CIPROFLOXACIN <=0.25 SENSITIVE Sensitive     GENTAMICIN <=1 SENSITIVE Sensitive     IMIPENEM <=0.25 SENSITIVE Sensitive     TRIMETH/SULFA <=20 SENSITIVE Sensitive     AMPICILLIN/SULBACTAM <=2 SENSITIVE Sensitive     PIP/TAZO <=4 SENSITIVE Sensitive     * RARE ESCHERICHIA COLI  Resp Panel by RT-PCR (Flu A&B, Covid) Nasopharyngeal Swab     Status: None   Collection Time: 02/07/21  8:52 AM   Specimen: Nasopharyngeal Swab; Nasopharyngeal(NP) swabs in vial transport medium  Result Value Ref Range Status   SARS Coronavirus 2 by RT PCR NEGATIVE NEGATIVE Final    Comment: (NOTE) SARS-CoV-2 target nucleic acids are NOT DETECTED.  The SARS-CoV-2 RNA is generally detectable in upper respiratory specimens during the acute phase of infection. The lowest concentration of SARS-CoV-2 viral copies this assay can detect is 138 copies/mL. A negative result does not preclude SARS-Cov-2 infection and should not be used as the sole basis for treatment or other patient management decisions. A negative result may occur with  improper specimen collection/handling, submission of specimen other than nasopharyngeal swab, presence of viral mutation(s) within the areas targeted by this assay, and inadequate number of viral copies(<138 copies/mL). A negative result must be combined with clinical observations, patient history, and epidemiological information. The expected result is Negative.  Fact Sheet for Patients:  BloggerCourse.com  Fact Sheet for Healthcare Providers:  SeriousBroker.it  This test is no t yet approved or cleared by the [place]  FDA and  has been authorized for detection and/or diagnosis of SARS-CoV-2 by FDA under an Emergency Use Authorization (EUA). This EUA will remain  in effect (meaning this test can be used) for the duration of the COVID-19 declaration under Section 564(b)(1) of the Act, 21 U.S.C.section 360bbb-3(b)(1), unless the authorization is  terminated  or revoked sooner.       Influenza A by PCR NEGATIVE NEGATIVE Final   Influenza B by PCR NEGATIVE NEGATIVE Final    Comment: (NOTE) The Xpert Xpress SARS-CoV-2/FLU/RSV plus assay is intended as an aid in the diagnosis of influenza from Nasopharyngeal swab specimens and should not be used as a sole basis for treatment. Nasal washings and aspirates are unacceptable for Xpert Xpress SARS-CoV-2/FLU/RSV testing.  Fact Sheet for Patients: BloggerCourse.comhttps://www.fda.gov/media/152166/download  Fact Sheet for Healthcare Providers: SeriousBroker.ithttps://www.fda.gov/media/152162/download  This test is not yet approved or cleared by the [place] FDA and has been authorized for detection and/or diagnosis of SARS-CoV-2 by FDA under an Emergency Use Authorization (EUA). This EUA will remain in effect (meaning this test can be used) for the duration of the COVID-19 declaration under Section 564(b)(1) of the Act, 21 U.S.C. section 360bbb-3(b)(1), unless the authorization is terminated or revoked.  Performed at [facility], [address] [place], [place] [id number]     Pertinent Imaging All pertinent labs/Imagings/notes reviewed. All pertinent plain films and CT images have been personally visualized and interpreted; radiology reports have been reviewed. Decision making incorporated into the Impression / Recommendations.  MRI Lumbar spine WO contrast 03/29/21   FINDINGS: Segmentation: Standard segmentation is assumed. The inferior-most fully formed intervertebral disc is labeled L5-S1.   Alignment:  Mild anterolisthesis at L3-L4 and L4-L5, similar.    Vertebrae: Erosive changes are again seen at the L1-L2 endplates. Previously seen marrow edema involving the L1-L2 vertebral bodies is improved. Discitis/osteomyelitis at this level is further discussed below.   Conus medullaris and cauda equina: Conus extends to the L1 level. Conus appears unremarkable.   Paraspinal and other soft tissues: Improved psoas edema at L1-L2, as detailed below. Left renal cyst. Nonspecific mild circumferential bladder wall thickening, partially imaged.   Disc levels:   T12-L1: Mild facet hypertrophy and right paracentral disc protrusion without significant canal or foraminal stenosis.   L1-L2: At L1-L2 there is improved abnormal STIR hyperintense signal within the disc space, compatible with improving discitis. As seen on the priors, there is extension of abnormal STIR hyperintensity into the left psoas musculature (series 7, image 12; series 8, image 9) with improved surrounding psoas edema. 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 Moderate bilateral foraminal stenosis.   L2-L3: Similar disc bulge and moderate bilateral facet hypertrophy with ligamentum flavum thickening. Similar mild bilateral foraminal stenosis and mild canal stenosis.   L3-L4: Similar grade 1 anterolisthesis of L3 on L4. Similar marked facet hypertrophy with moderate canal stenosis and mild bilateral foraminal stenosis.   L4-L5: Similar grade 1 anterolisthesis with uncovering of the disc. Similar marked bilateral facet hypertrophy. Similar mild to moderate canal stenosis and mild bilateral foraminal stenosis.   L5-S1: Severe right and moderate left facet hypertrophy. No significant canal or foraminal stenosis.   IMPRESSION: 1. Continued improvement in discitis/osteomyelitis at L1-L2. There is improved psoas edema/phlegmon with persistent edema in the disc that extends from the disc space into the adjacent left  psoas (intradiscal abscess here is not excluded). Ventral epidural extension of infection is also improved with improved (now mild) canal stenosis at this level. Postcontrast imaging could further characterize these findings if clinically indicated. 2. Similar multilevel degenerative change, detailed above and including moderate canal stenosis at L3-L4. 3. Nonspecific mild circumferential bladder wall thickening, partially imaged.   I have spent 30  minutes for this patient encounter including  review of prior  medical records with greater than 50% of time in face to face counsel of the patient/discussing diagnostics and plan of care.   Electronically signed by:  Odette Fraction, MD Infectious Disease Physician [facility] Infectious Disease [address] [place], [place] [address] Phone: [phone]  Fax: [phone]

## 2021-04-25 ENCOUNTER — Telehealth: Payer: Self-pay

## 2021-04-25 DIAGNOSIS — E049 Nontoxic goiter, unspecified: Secondary | ICD-10-CM | POA: Diagnosis not present

## 2021-04-25 DIAGNOSIS — E039 Hypothyroidism, unspecified: Secondary | ICD-10-CM | POA: Diagnosis not present

## 2021-04-25 DIAGNOSIS — D649 Anemia, unspecified: Secondary | ICD-10-CM | POA: Diagnosis not present

## 2021-04-25 DIAGNOSIS — E7800 Pure hypercholesterolemia, unspecified: Secondary | ICD-10-CM | POA: Diagnosis not present

## 2021-04-25 DIAGNOSIS — M4626 Osteomyelitis of vertebra, lumbar region: Secondary | ICD-10-CM | POA: Diagnosis not present

## 2021-04-25 DIAGNOSIS — B9620 Unspecified Escherichia coli [E. coli] as the cause of diseases classified elsewhere: Secondary | ICD-10-CM | POA: Diagnosis not present

## 2021-04-25 DIAGNOSIS — M4646 Discitis, unspecified, lumbar region: Secondary | ICD-10-CM

## 2021-04-25 DIAGNOSIS — M179 Osteoarthritis of knee, unspecified: Secondary | ICD-10-CM | POA: Diagnosis not present

## 2021-04-25 DIAGNOSIS — I10 Essential (primary) hypertension: Secondary | ICD-10-CM | POA: Diagnosis not present

## 2021-04-25 LAB — BASIC METABOLIC PANEL
BUN/Creatinine Ratio: 16 (calc) (ref 6–22)
BUN: 18 mg/dL (ref 7–25)
CO2: 30 mmol/L (ref 20–32)
Calcium: 9.3 mg/dL (ref 8.6–10.4)
Chloride: 100 mmol/L (ref 98–110)
Creat: 1.11 mg/dL — ABNORMAL HIGH (ref 0.60–0.93)
Glucose, Bld: 101 mg/dL — ABNORMAL HIGH (ref 65–99)
Potassium: 4.2 mmol/L (ref 3.5–5.3)
Sodium: 140 mmol/L (ref 135–146)

## 2021-04-25 LAB — SEDIMENTATION RATE: Sed Rate: 56 mm/h — ABNORMAL HIGH (ref 0–30)

## 2021-04-25 LAB — C-REACTIVE PROTEIN: CRP: 27.9 mg/L — ABNORMAL HIGH (ref <8.0)

## 2021-04-25 NOTE — Telephone Encounter (Signed)
Scheduled patient for lab appointment 05/02/21.  Sandie Ano, RN

## 2021-04-25 NOTE — Telephone Encounter (Signed)
-----   Message from Odette Fraction, MD sent at 04/25/2021  6:09 AM EDT ----- Regarding: lab results Please tell her to drop amoxicillin from 1g po tid to 1g po bid Continue Amoxicillin 2 g bid for 2 week, lab visit for BMP in a week and fu with me in 2 weeks  Thanks

## 2021-04-25 NOTE — Telephone Encounter (Signed)
Spoke with patient, advised her that Dr. Elinor Parkinson would like her to take the amoxicillin 1,000 mg twice a day instead of three times a day (2 pills twice a day) until she sees Dr. Elinor Parkinson on 05/18/21.   Relayed that her inflammatory markers are still somewhat high and that her kidney function labs are elevated. Patient verbalized understanding and has no further questions.   Advised patient to please call with any questions or concerns.   Sandie Ano, RN

## 2021-04-27 DIAGNOSIS — E039 Hypothyroidism, unspecified: Secondary | ICD-10-CM | POA: Diagnosis not present

## 2021-04-27 DIAGNOSIS — E7800 Pure hypercholesterolemia, unspecified: Secondary | ICD-10-CM | POA: Diagnosis not present

## 2021-04-27 DIAGNOSIS — M4626 Osteomyelitis of vertebra, lumbar region: Secondary | ICD-10-CM | POA: Diagnosis not present

## 2021-04-27 DIAGNOSIS — M179 Osteoarthritis of knee, unspecified: Secondary | ICD-10-CM | POA: Diagnosis not present

## 2021-04-27 DIAGNOSIS — M4646 Discitis, unspecified, lumbar region: Secondary | ICD-10-CM | POA: Diagnosis not present

## 2021-04-27 DIAGNOSIS — I10 Essential (primary) hypertension: Secondary | ICD-10-CM | POA: Diagnosis not present

## 2021-04-27 DIAGNOSIS — E049 Nontoxic goiter, unspecified: Secondary | ICD-10-CM | POA: Diagnosis not present

## 2021-04-27 DIAGNOSIS — B9620 Unspecified Escherichia coli [E. coli] as the cause of diseases classified elsewhere: Secondary | ICD-10-CM | POA: Diagnosis not present

## 2021-04-27 DIAGNOSIS — D649 Anemia, unspecified: Secondary | ICD-10-CM | POA: Diagnosis not present

## 2021-04-30 DIAGNOSIS — M4646 Discitis, unspecified, lumbar region: Secondary | ICD-10-CM | POA: Diagnosis not present

## 2021-04-30 DIAGNOSIS — I10 Essential (primary) hypertension: Secondary | ICD-10-CM | POA: Diagnosis not present

## 2021-04-30 DIAGNOSIS — E039 Hypothyroidism, unspecified: Secondary | ICD-10-CM | POA: Diagnosis not present

## 2021-04-30 DIAGNOSIS — B9620 Unspecified Escherichia coli [E. coli] as the cause of diseases classified elsewhere: Secondary | ICD-10-CM | POA: Diagnosis not present

## 2021-04-30 DIAGNOSIS — E7800 Pure hypercholesterolemia, unspecified: Secondary | ICD-10-CM | POA: Diagnosis not present

## 2021-04-30 DIAGNOSIS — M179 Osteoarthritis of knee, unspecified: Secondary | ICD-10-CM | POA: Diagnosis not present

## 2021-04-30 DIAGNOSIS — E049 Nontoxic goiter, unspecified: Secondary | ICD-10-CM | POA: Diagnosis not present

## 2021-04-30 DIAGNOSIS — M4626 Osteomyelitis of vertebra, lumbar region: Secondary | ICD-10-CM | POA: Diagnosis not present

## 2021-04-30 DIAGNOSIS — D649 Anemia, unspecified: Secondary | ICD-10-CM | POA: Diagnosis not present

## 2021-05-02 ENCOUNTER — Other Ambulatory Visit: Payer: Medicare PPO

## 2021-05-02 ENCOUNTER — Other Ambulatory Visit: Payer: Self-pay

## 2021-05-02 DIAGNOSIS — M4646 Discitis, unspecified, lumbar region: Secondary | ICD-10-CM | POA: Diagnosis not present

## 2021-05-02 LAB — BASIC METABOLIC PANEL
BUN/Creatinine Ratio: 19 (calc) (ref 6–22)
BUN: 20 mg/dL (ref 7–25)
CO2: 28 mmol/L (ref 20–32)
Calcium: 9 mg/dL (ref 8.6–10.4)
Chloride: 101 mmol/L (ref 98–110)
Creat: 1.08 mg/dL — ABNORMAL HIGH (ref 0.60–0.93)
Glucose, Bld: 90 mg/dL (ref 65–99)
Potassium: 3.9 mmol/L (ref 3.5–5.3)
Sodium: 137 mmol/L (ref 135–146)

## 2021-05-03 DIAGNOSIS — E7800 Pure hypercholesterolemia, unspecified: Secondary | ICD-10-CM | POA: Diagnosis not present

## 2021-05-03 DIAGNOSIS — I10 Essential (primary) hypertension: Secondary | ICD-10-CM | POA: Diagnosis not present

## 2021-05-03 DIAGNOSIS — B9620 Unspecified Escherichia coli [E. coli] as the cause of diseases classified elsewhere: Secondary | ICD-10-CM | POA: Diagnosis not present

## 2021-05-03 DIAGNOSIS — M179 Osteoarthritis of knee, unspecified: Secondary | ICD-10-CM | POA: Diagnosis not present

## 2021-05-03 DIAGNOSIS — M4626 Osteomyelitis of vertebra, lumbar region: Secondary | ICD-10-CM | POA: Diagnosis not present

## 2021-05-03 DIAGNOSIS — E049 Nontoxic goiter, unspecified: Secondary | ICD-10-CM | POA: Diagnosis not present

## 2021-05-03 DIAGNOSIS — M4646 Discitis, unspecified, lumbar region: Secondary | ICD-10-CM | POA: Diagnosis not present

## 2021-05-03 DIAGNOSIS — E039 Hypothyroidism, unspecified: Secondary | ICD-10-CM | POA: Diagnosis not present

## 2021-05-03 DIAGNOSIS — D649 Anemia, unspecified: Secondary | ICD-10-CM | POA: Diagnosis not present

## 2021-05-08 DIAGNOSIS — I10 Essential (primary) hypertension: Secondary | ICD-10-CM | POA: Diagnosis not present

## 2021-05-08 DIAGNOSIS — E7800 Pure hypercholesterolemia, unspecified: Secondary | ICD-10-CM | POA: Diagnosis not present

## 2021-05-08 DIAGNOSIS — M179 Osteoarthritis of knee, unspecified: Secondary | ICD-10-CM | POA: Diagnosis not present

## 2021-05-08 DIAGNOSIS — B9620 Unspecified Escherichia coli [E. coli] as the cause of diseases classified elsewhere: Secondary | ICD-10-CM | POA: Diagnosis not present

## 2021-05-08 DIAGNOSIS — E039 Hypothyroidism, unspecified: Secondary | ICD-10-CM | POA: Diagnosis not present

## 2021-05-08 DIAGNOSIS — M4646 Discitis, unspecified, lumbar region: Secondary | ICD-10-CM | POA: Diagnosis not present

## 2021-05-08 DIAGNOSIS — E049 Nontoxic goiter, unspecified: Secondary | ICD-10-CM | POA: Diagnosis not present

## 2021-05-08 DIAGNOSIS — D649 Anemia, unspecified: Secondary | ICD-10-CM | POA: Diagnosis not present

## 2021-05-08 DIAGNOSIS — M4626 Osteomyelitis of vertebra, lumbar region: Secondary | ICD-10-CM | POA: Diagnosis not present

## 2021-05-08 NOTE — Telephone Encounter (Signed)
Patient called, states she was told to stop taking her amoxicillin on 05/09/21. She is wanting to confirm end date. Per last phone conversation, patient is to continue with amoxicillin until she sees Dr. Elinor Parkinson on 05/18/21. Patient verbalized understanding and has no further questions.   Sandie Ano, RN

## 2021-05-10 DIAGNOSIS — B9620 Unspecified Escherichia coli [E. coli] as the cause of diseases classified elsewhere: Secondary | ICD-10-CM | POA: Diagnosis not present

## 2021-05-10 DIAGNOSIS — M4626 Osteomyelitis of vertebra, lumbar region: Secondary | ICD-10-CM | POA: Diagnosis not present

## 2021-05-10 DIAGNOSIS — M179 Osteoarthritis of knee, unspecified: Secondary | ICD-10-CM | POA: Diagnosis not present

## 2021-05-10 DIAGNOSIS — D649 Anemia, unspecified: Secondary | ICD-10-CM | POA: Diagnosis not present

## 2021-05-10 DIAGNOSIS — E7800 Pure hypercholesterolemia, unspecified: Secondary | ICD-10-CM | POA: Diagnosis not present

## 2021-05-10 DIAGNOSIS — E049 Nontoxic goiter, unspecified: Secondary | ICD-10-CM | POA: Diagnosis not present

## 2021-05-10 DIAGNOSIS — I10 Essential (primary) hypertension: Secondary | ICD-10-CM | POA: Diagnosis not present

## 2021-05-10 DIAGNOSIS — E039 Hypothyroidism, unspecified: Secondary | ICD-10-CM | POA: Diagnosis not present

## 2021-05-10 DIAGNOSIS — M4646 Discitis, unspecified, lumbar region: Secondary | ICD-10-CM | POA: Diagnosis not present

## 2021-05-11 DIAGNOSIS — M4646 Discitis, unspecified, lumbar region: Secondary | ICD-10-CM | POA: Diagnosis not present

## 2021-05-11 DIAGNOSIS — R591 Generalized enlarged lymph nodes: Secondary | ICD-10-CM | POA: Diagnosis not present

## 2021-05-11 DIAGNOSIS — E039 Hypothyroidism, unspecified: Secondary | ICD-10-CM | POA: Diagnosis not present

## 2021-05-11 DIAGNOSIS — D649 Anemia, unspecified: Secondary | ICD-10-CM | POA: Diagnosis not present

## 2021-05-11 DIAGNOSIS — E785 Hyperlipidemia, unspecified: Secondary | ICD-10-CM | POA: Diagnosis not present

## 2021-05-11 DIAGNOSIS — I10 Essential (primary) hypertension: Secondary | ICD-10-CM | POA: Diagnosis not present

## 2021-05-15 DIAGNOSIS — M170 Bilateral primary osteoarthritis of knee: Secondary | ICD-10-CM | POA: Diagnosis not present

## 2021-05-15 DIAGNOSIS — B9620 Unspecified Escherichia coli [E. coli] as the cause of diseases classified elsewhere: Secondary | ICD-10-CM | POA: Diagnosis not present

## 2021-05-15 DIAGNOSIS — E039 Hypothyroidism, unspecified: Secondary | ICD-10-CM | POA: Diagnosis not present

## 2021-05-15 DIAGNOSIS — E041 Nontoxic single thyroid nodule: Secondary | ICD-10-CM | POA: Diagnosis not present

## 2021-05-15 DIAGNOSIS — M4316 Spondylolisthesis, lumbar region: Secondary | ICD-10-CM | POA: Diagnosis not present

## 2021-05-15 DIAGNOSIS — M4626 Osteomyelitis of vertebra, lumbar region: Secondary | ICD-10-CM | POA: Diagnosis not present

## 2021-05-15 DIAGNOSIS — D649 Anemia, unspecified: Secondary | ICD-10-CM | POA: Diagnosis not present

## 2021-05-15 DIAGNOSIS — E7800 Pure hypercholesterolemia, unspecified: Secondary | ICD-10-CM | POA: Diagnosis not present

## 2021-05-15 DIAGNOSIS — I10 Essential (primary) hypertension: Secondary | ICD-10-CM | POA: Diagnosis not present

## 2021-05-18 ENCOUNTER — Ambulatory Visit (INDEPENDENT_AMBULATORY_CARE_PROVIDER_SITE_OTHER): Payer: Medicare PPO | Admitting: Infectious Diseases

## 2021-05-18 ENCOUNTER — Other Ambulatory Visit: Payer: Self-pay

## 2021-05-18 ENCOUNTER — Encounter: Payer: Self-pay | Admitting: Infectious Diseases

## 2021-05-18 VITALS — BP 129/76 | HR 79 | Temp 98.3°F | Wt 249.0 lb

## 2021-05-18 DIAGNOSIS — M4646 Discitis, unspecified, lumbar region: Secondary | ICD-10-CM | POA: Diagnosis not present

## 2021-05-18 MED ORDER — AMOXICILLIN 500 MG PO CAPS: 1000.0000 mg | ORAL_CAPSULE | Freq: Two times a day (BID) | ORAL | 0 refills | Status: DC

## 2021-05-18 NOTE — Progress Notes (Addendum)
[facility]                                                             [place], [place], [place], [address]                                                                  Phn. 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 Coli. Repeat MRL L spine 5/26 with improved findings  Completed IV cefazolin on 6/7 and started on amoxicillin  Medication monitoring   Plan Continue Amoxicillin 1g BID  ( renal dosing), meds refilled  Check BMP, ESR and CRP today Follow up in a month   All questions and concerns were discussed and addressed. Patient verbalized understanding of the plan. ____________________________________________________________________________________________________________________  HPI: 70 year old female with past medical history of hypertension, hypothyroidism who is here for HFU after recent hospital admission 3/26-4/6 for Lumbar discitis/OM and possible intradiscal abscess extending into RT psoas. MRI LS 3/25 showed discitis/osteomyelitis at L1-L2 with paraspinal edema. Intradiscal abscess extending into the right psoas is not excluded.Evaluated by [facility] in the hospital and underwent CT CT-guided aspiration on 3/27 with cultures growing E. Coli. Seen by ID, initially on daptomycin and ceftriaxone which was de-escalated to cefazolin after sensi's. Planned for 6 weeks, end date 03/11/21. She was discharged to a SNF on 02/07/21   02/14/21 Doing well. Back pain has improved from 10 to 4. Denies fevers, chills and sweats. Denies nausea, vomiting, abdominal  pain and diarrhea. She is working with PT and is able to stand without support. She feels she is getting much stronger. Getting IV cefazolin as instructed. No issues with PICC line or new complains. She will follow up with me in 3 weeks before he end date of antibiotics after her MRI Lumbar spine is done. She verbalised understanding to the plan.   03/09/21 Doing well. Back pain is down to 1-2/10. She is able to walk with the help of walker. Discussed recent MRL spine results and need to continue 2-3 more weeks of IV abx and plan to repeat MRI spine in 2 weeks before discontinuing antibiotics to which she agreed. No issues with antibiotics like nausea, vomiting, rashes and diarrhea. No issues with the PICC line. She is pleased with her progress and very appreciative of the tx.  04/10/21 Here for follow-up of lumbar discitis/osteomyelitis.  She has been getting IV  cefazolin from her PICC line.  Denies any issues with the PICC line.  Denies any issues with the antibiotics like nausea vomiting diarrhea rashes.  Denies fevers chills and sweats.  Back pain is essentially improved, she has some discomfort when getting out of bed onto chair.  She is working with physical therapy and thinks her strength is getting back.  She is able to walk with the help of walker.  I discussed with her regarding her recent MRI lumbar spine findings and plan to DC PICC line and IV antibiotics and start p.o. amoxicillin.  She denies any allergies to penicillin in the past.  She will follow-up with me in 3 weeks.  04/24/21 Here for follow-up of lumbar discitis/osteomyelitis.  She is taking amoxicillin orally 1 g 3 times a day.  Denies missing any doses.  Denies any side effects with antibiotics like nausea vomiting, diarrhea or rashes.  Denies any fever chills and sweats.  Back pain has improved to 2 out of 10.  She is able to walk briefly without the help of walker and without support.  She is discharged to home from her facility.  She  is happy with her progress and denies any complaints today.  05/08/21 Here for follow up of lumbar discitis/osteomyelitis. Amoxicillin dosing was changed from 1g TID dosing to  BID dosing last time given mild elevation of Cr. Last Cr on 6/29 has downtrended after BID dosing. She denies any issues with the antibiotics like nausea, vomiting and diarrhea. Back pain is almost gone and she says sometime she has back pain around 1-2/10 when she moves around. She has also started walking with a cane now. Prior she used to use a walker. She showed me a dark discoloration at her rt hand knuckle which is not itching, non tender and was asking if this is related to the antibiotics which I think is unrelated. Discussed with her last inflammatory markers still being high and plan to get inflammatory markers today.   ROS: 12 point ROS done with pertinent positives and negative listed above   Past Medical History:  Diagnosis Date   Complication of anesthesia    hard to wake up after eye surgery in 1970s   Goiter    with thyroid nodule, FNA 85/63 benign follicular nodule   Hypercholesterolemia    Hypertension    Hypothyroidism    OA (osteoarthritis) of knee    mod.   Spondylolisthesis of lumbar region    Past Surgical History:  Procedure Laterality Date   COLONOSCOPY     EYE SURGERY     THYROID SURGERY  1999   Current Outpatient Medications on File Prior to Visit  Medication Sig Dispense Refill   aspirin 81 MG chewable tablet Chew 81 mg by mouth every morning.     carvedilol (COREG) 12.5 MG tablet Take 12.5 mg by mouth 2 (two) times daily.     cholecalciferol (VITAMIN D) 1000 UNITS tablet Take 1,000 Units by mouth 2 (two) times daily.     ferrous sulfate 325 (65 FE) MG tablet Take 325 mg by mouth daily with breakfast.     levothyroxine (SYNTHROID, LEVOTHROID) 100 MCG tablet Take 100 mcg by mouth daily at 6 (six) AM.     Magnesium 250 MG TABS Take 500 mg by mouth every morning.     tamsulosin (FLOMAX)  0.4 MG CAPS capsule Take 1 capsule (0.4 mg total) by mouth daily after breakfast. 30 capsule    tetrahydrozoline 0.05 % ophthalmic  solution Place 1 drop into both eyes daily as needed (dry eyes).     tiZANidine (ZANAFLEX) 2 MG tablet Take 1 tablet (2 mg total) by mouth every 8 (eight) hours as needed for muscle spasms. 21 tablet 0   vitamin C (ASCORBIC ACID) 500 MG tablet Take 500 mg by mouth daily.     ezetimibe (ZETIA) 10 MG tablet Take 10 mg by mouth at bedtime.     No current facility-administered medications on file prior to visit.    Allergies  Allergen Reactions   Atorvastatin     Other reaction(s): muscle aches   Influenza Vac Recombinant Ha Trivalent [Influenza Vaccine Recombinant]     Other reaction(s): severe reaction in 1970s--skin pealed   Rosuvastatin     Other reaction(s): muslce aches    Social History   Socioeconomic History   Marital status: Divorced    Spouse name: Not on file   Number of children: Not on file   Years of education: Not on file   Highest education level: Not on file  Occupational History   Not on file  Tobacco Use   Smoking status: Never   Smokeless tobacco: Never  Vaping Use   Vaping Use: Never used  Substance and Sexual Activity   Alcohol use: No   Drug use: No   Sexual activity: Not on file  Other Topics Concern   Not on file  Social History Narrative   Not on file   Social Determinants of Health   Financial Resource Strain: Not on file  Food Insecurity: Not on file  Transportation Needs: Not on file  Physical Activity: Not on file  Stress: Not on file  Social Connections: Not on file  Intimate Partner Violence: Not on file    Vitals BP 129/76   Pulse 79   Temp 98.3 F (36.8 C) (Oral)   Wt 249 lb (112.9 kg)   SpO2 100%   BMI 40.19 kg/m   Examination  General - not in acute distress, comfortably sitting in  WHEEL CHAIR  HEENT - PEERLA, no pallor and no icterus Chest - b/l clear air entry, no additional  sounds CVS- Normal s1s2, RRR Abdomen - Soft, Non tender , non distended Ext- no pedal edema Neuro: grossly normal Back - WNL, no vertebral tenderness, brace at the back  Psych : calm and cooperative   Recent labs CBC Latest Ref Rng & Units 02/06/2021 02/04/2021 02/01/2021  WBC 4.0 - 10.5 K/uL 7.6 7.3 7.4  Hemoglobin 12.0 - 15.0 g/dL 8.4(L) 8.2(L) 8.2(L)  Hematocrit 36.0 - 46.0 % 26.3(L) 26.0(L) 26.0(L)  Platelets 150 - 400 K/uL 277 259 244    CMP Latest Ref Rng & Units 05/02/2021 04/24/2021 02/06/2021  Glucose 65 - 99 mg/dL 90 101(H) 87  BUN 7 - 25 mg/dL 20 18 5(L)  Creatinine 0.60 - 0.93 mg/dL 1.08(H) 1.11(H) 0.67  Sodium 135 - 146 mmol/L 137 140 138  Potassium 3.5 - 5.3 mmol/L 3.9 4.2 3.5  Chloride 98 - 110 mmol/L 101 100 103  CO2 20 - 32 mmol/L $RemoveB'28 30 28  'VyWJEUTU$ Calcium 8.6 - 10.4 mg/dL 9.0 9.3 8.7(L)  Total Protein 6.5 - 8.1 g/dL - - -  Total Bilirubin 0.3 - 1.2 mg/dL - - -  Alkaline Phos 38 - 126 U/L - - -  AST 15 - 41 U/L - - -  ALT 0 - 44 U/L - - -    Pertinent Microbiology Results for orders placed or performed during the hospital  encounter of 01/27/21  Culture, blood (routine x 2)     Status: None   Collection Time: 01/27/21  6:33 PM   Specimen: BLOOD  Result Value Ref Range Status   Specimen Description BLOOD LEFT ANTECUBITAL  Final   Special Requests   Final    BOTTLES DRAWN AEROBIC AND ANAEROBIC Blood Culture adequate volume   Culture   Final    NO GROWTH 5 DAYS Performed at [facility], [address] [place], [place] [address]    Report Status 02/01/2021 FINAL  Final  Resp Panel by RT-PCR (Flu A&B, Covid) Nasopharyngeal Swab     Status: None   Collection Time: 01/27/21  6:33 PM   Specimen: Nasopharyngeal Swab; Nasopharyngeal(NP) swabs in vial transport medium  Result Value Ref Range Status   SARS Coronavirus 2 by RT PCR NEGATIVE NEGATIVE Final    Comment: (NOTE) SARS-CoV-2 target nucleic acids are NOT DETECTED.  The SARS-CoV-2 RNA is generally detectable in  upper respiratory specimens during the acute phase of infection. The lowest concentration of SARS-CoV-2 viral copies this assay can detect is 138 copies/mL. A negative result does not preclude SARS-Cov-2 infection and should not be used as the sole basis for treatment or other patient management decisions. A negative result may occur with  improper specimen collection/handling, submission of specimen other than nasopharyngeal swab, presence of viral mutation(s) within the areas targeted by this assay, and inadequate number of viral copies(<138 copies/mL). A negative result must be combined with clinical observations, patient history, and epidemiological information. The expected result is Negative.  Fact Sheet for Patients:  EntrepreneurPulse.com.au  Fact Sheet for Healthcare Providers:  IncredibleEmployment.be  This test is no t yet approved or cleared by the [place] FDA and  has been authorized for detection and/or diagnosis of SARS-CoV-2 by FDA under an Emergency Use Authorization (EUA). This EUA will remain  in effect (meaning this test can be used) for the duration of the COVID-19 declaration under Section 564(b)(1) of the Act, 21 U.S.C.section 360bbb-3(b)(1), unless the authorization is terminated  or revoked sooner.       Influenza A by PCR NEGATIVE NEGATIVE Final   Influenza B by PCR NEGATIVE NEGATIVE Final    Comment: (NOTE) The Xpert Xpress SARS-CoV-2/FLU/RSV plus assay is intended as an aid in the diagnosis of influenza from Nasopharyngeal swab specimens and should not be used as a sole basis for treatment. Nasal washings and aspirates are unacceptable for Xpert Xpress SARS-CoV-2/FLU/RSV testing.  Fact Sheet for Patients: EntrepreneurPulse.com.au  Fact Sheet for Healthcare Providers: IncredibleEmployment.be  This test is not yet approved or cleared by the [place] FDA and has been  authorized for detection and/or diagnosis of SARS-CoV-2 by FDA under an Emergency Use Authorization (EUA). This EUA will remain in effect (meaning this test can be used) for the duration of the COVID-19 declaration under Section 564(b)(1) of the Act, 21 U.S.C. section 360bbb-3(b)(1), unless the authorization is terminated or revoked.  Performed at [facility], [place] [address], [place], [place] [address]   Urine culture     Status: Abnormal   Collection Time: 01/27/21  6:33 PM   Specimen: Urine, Random  Result Value Ref Range Status   Specimen Description URINE, RANDOM  Final   Special Requests   Final    NONE Performed at [facility], [place] [address], [place], [place] [address]    Culture MULTIPLE SPECIES PRESENT, SUGGEST RECOLLECTION (A)  Final   Report Status 01/29/2021  FINAL  Final  Aerobic/Anaerobic Culture (surgical/deep wound)     Status: None   Collection Time: 01/28/21 10:47 AM   Specimen: Fluid; Tissue  Result Value Ref Range Status   Specimen Description FLUID L1/L2 INTERVERTEBRAL DISC AND PARASPINAL  Final   Special Requests NONE  Final   Gram Stain   Final    MODERATE WBC PRESENT, PREDOMINANTLY PMN NO ORGANISMS SEEN    Culture   Final    RARE ESCHERICHIA COLI NO ANAEROBES ISOLATED Performed at [facility], [address], [place], [place] [address]    Report Status 02/02/2021 FINAL  Final   Organism ID, Bacteria ESCHERICHIA COLI  Final      Susceptibility   Escherichia coli - MIC*    AMPICILLIN 4 SENSITIVE Sensitive     CEFAZOLIN <=4 SENSITIVE Sensitive     CEFEPIME <=0.12 SENSITIVE Sensitive     CEFTAZIDIME <=1 SENSITIVE Sensitive     CEFTRIAXONE <=0.25 SENSITIVE Sensitive     CIPROFLOXACIN <=0.25 SENSITIVE Sensitive     GENTAMICIN <=1 SENSITIVE Sensitive     IMIPENEM <=0.25 SENSITIVE Sensitive     TRIMETH/SULFA <=20 SENSITIVE Sensitive     AMPICILLIN/SULBACTAM <=2 SENSITIVE Sensitive     PIP/TAZO <=4 SENSITIVE Sensitive     * RARE  ESCHERICHIA COLI  Resp Panel by RT-PCR (Flu A&B, Covid) Nasopharyngeal Swab     Status: None   Collection Time: 02/07/21  8:52 AM   Specimen: Nasopharyngeal Swab; Nasopharyngeal(NP) swabs in vial transport medium  Result Value Ref Range Status   SARS Coronavirus 2 by RT PCR NEGATIVE NEGATIVE Final    Comment: (NOTE) SARS-CoV-2 target nucleic acids are NOT DETECTED.  The SARS-CoV-2 RNA is generally detectable in upper respiratory specimens during the acute phase of infection. The lowest concentration of SARS-CoV-2 viral copies this assay can detect is 138 copies/mL. A negative result does not preclude SARS-Cov-2 infection and should not be used as the sole basis for treatment or other patient management decisions. A negative result may occur with  improper specimen collection/handling, submission of specimen other than nasopharyngeal swab, presence of viral mutation(s) within the areas targeted by this assay, and inadequate number of viral copies(<138 copies/mL). A negative result must be combined with clinical observations, patient history, and epidemiological information. The expected result is Negative.  Fact Sheet for Patients:  EntrepreneurPulse.com.au  Fact Sheet for Healthcare Providers:  IncredibleEmployment.be  This test is no t yet approved or cleared by the [place] FDA and  has been authorized for detection and/or diagnosis of SARS-CoV-2 by FDA under an Emergency Use Authorization (EUA). This EUA will remain  in effect (meaning this test can be used) for the duration of the COVID-19 declaration under Section 564(b)(1) of the Act, 21 U.S.C.section 360bbb-3(b)(1), unless the authorization is terminated  or revoked sooner.       Influenza A by PCR NEGATIVE NEGATIVE Final   Influenza B by PCR NEGATIVE NEGATIVE Final    Comment: (NOTE) The Xpert Xpress SARS-CoV-2/FLU/RSV plus assay is intended as an aid in the diagnosis of  influenza from Nasopharyngeal swab specimens and should not be used as a sole basis for treatment. Nasal washings and aspirates are unacceptable for Xpert Xpress SARS-CoV-2/FLU/RSV testing.  Fact Sheet for Patients: EntrepreneurPulse.com.au  Fact Sheet for Healthcare Providers: IncredibleEmployment.be  This test is not yet approved or cleared by the [place] FDA and has been authorized for detection and/or diagnosis of SARS-CoV-2 by FDA under an Emergency Use Authorization (EUA). This  EUA will remain in effect (meaning this test can be used) for the duration of the COVID-19 declaration under Section 564(b)(1) of the Act, 21 U.S.C. section 360bbb-3(b)(1), unless the authorization is terminated or revoked.  Performed at [facility], [place] [address] [place], [place] [address]     Pertinent Imaging All pertinent labs/Imagings/notes reviewed. All pertinent plain films and CT images have been personally visualized and interpreted; radiology reports have been reviewed. Decision making incorporated into the Impression / Recommendations.  MRI Lumbar spine WO contrast 03/29/21   FINDINGS: Segmentation: Standard segmentation is assumed. The inferior-most fully formed intervertebral disc is labeled L5-S1.   Alignment:  Mild anterolisthesis at L3-L4 and L4-L5, similar.   Vertebrae: Erosive changes are again seen at the L1-L2 endplates. Previously seen marrow edema involving the L1-L2 vertebral bodies is improved. Discitis/osteomyelitis at this level is further discussed below.   Conus medullaris and cauda equina: Conus extends to the L1 level. Conus appears unremarkable.   Paraspinal and other soft tissues: Improved psoas edema at L1-L2, as detailed below. Left renal cyst. Nonspecific mild circumferential bladder wall thickening, partially imaged.   Disc levels:   T12-L1: Mild facet hypertrophy and right paracentral disc  protrusion without significant canal or foraminal stenosis.   L1-L2: At L1-L2 there is improved abnormal STIR hyperintense signal within the disc space, compatible with improving discitis. As seen on the priors, there is extension of abnormal STIR hyperintensity into the left psoas musculature (series 7, image 12; series 8, image 9) with improved surrounding psoas edema. 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 Moderate bilateral foraminal stenosis.   L2-L3: Similar disc bulge and moderate bilateral facet hypertrophy with ligamentum flavum thickening. Similar mild bilateral foraminal stenosis and mild canal stenosis.   L3-L4: Similar grade 1 anterolisthesis of L3 on L4. Similar marked facet hypertrophy with moderate canal stenosis and mild bilateral foraminal stenosis.   L4-L5: Similar grade 1 anterolisthesis with uncovering of the disc. Similar marked bilateral facet hypertrophy. Similar mild to moderate canal stenosis and mild bilateral foraminal stenosis.   L5-S1: Severe right and moderate left facet hypertrophy. No significant canal or foraminal stenosis.   IMPRESSION: 1. Continued improvement in discitis/osteomyelitis at L1-L2. There is improved psoas edema/phlegmon with persistent edema in the disc that extends from the disc space into the adjacent left psoas (intradiscal abscess here is not excluded). Ventral epidural extension of infection is also improved with improved (now mild) canal stenosis at this level. Postcontrast imaging could further characterize these findings if clinically indicated. 2. Similar multilevel degenerative change, detailed above and including moderate canal stenosis at L3-L4. 3. Nonspecific mild circumferential bladder wall thickening, partially imaged.   I have spent 30  minutes for this patient encounter including  review of prior medical records with greater than 50% of time in face to face  counsel of the patient/discussing diagnostics and plan of care.   Electronically signed by:  Rosiland Oz, MD Infectious Disease Physician [facility] [address] [place], [place] [address] Phone: [phone]  Fax: [phone]

## 2021-05-19 LAB — BASIC METABOLIC PANEL
BUN/Creatinine Ratio: 18 (calc) (ref 6–22)
BUN: 19 mg/dL (ref 7–25)
CO2: 28 mmol/L (ref 20–32)
Calcium: 9.5 mg/dL (ref 8.6–10.4)
Chloride: 103 mmol/L (ref 98–110)
Creat: 1.04 mg/dL — ABNORMAL HIGH (ref 0.60–1.00)
Glucose, Bld: 87 mg/dL (ref 65–99)
Potassium: 4.6 mmol/L (ref 3.5–5.3)
Sodium: 141 mmol/L (ref 135–146)

## 2021-05-19 LAB — SEDIMENTATION RATE: Sed Rate: 33 mm/h — ABNORMAL HIGH (ref 0–30)

## 2021-05-19 LAB — C-REACTIVE PROTEIN: CRP: 12.7 mg/L — ABNORMAL HIGH (ref <8.0)

## 2021-05-22 DIAGNOSIS — M170 Bilateral primary osteoarthritis of knee: Secondary | ICD-10-CM | POA: Diagnosis not present

## 2021-05-22 DIAGNOSIS — M4626 Osteomyelitis of vertebra, lumbar region: Secondary | ICD-10-CM | POA: Diagnosis not present

## 2021-05-22 DIAGNOSIS — I10 Essential (primary) hypertension: Secondary | ICD-10-CM | POA: Diagnosis not present

## 2021-05-22 DIAGNOSIS — E041 Nontoxic single thyroid nodule: Secondary | ICD-10-CM | POA: Diagnosis not present

## 2021-05-22 DIAGNOSIS — B9620 Unspecified Escherichia coli [E. coli] as the cause of diseases classified elsewhere: Secondary | ICD-10-CM | POA: Diagnosis not present

## 2021-05-22 DIAGNOSIS — E039 Hypothyroidism, unspecified: Secondary | ICD-10-CM | POA: Diagnosis not present

## 2021-05-22 DIAGNOSIS — E7800 Pure hypercholesterolemia, unspecified: Secondary | ICD-10-CM | POA: Diagnosis not present

## 2021-05-22 DIAGNOSIS — D649 Anemia, unspecified: Secondary | ICD-10-CM | POA: Diagnosis not present

## 2021-05-22 DIAGNOSIS — M4316 Spondylolisthesis, lumbar region: Secondary | ICD-10-CM | POA: Diagnosis not present

## 2021-05-24 ENCOUNTER — Other Ambulatory Visit: Payer: Self-pay

## 2021-05-24 DIAGNOSIS — B9620 Unspecified Escherichia coli [E. coli] as the cause of diseases classified elsewhere: Secondary | ICD-10-CM | POA: Diagnosis not present

## 2021-05-24 DIAGNOSIS — D649 Anemia, unspecified: Secondary | ICD-10-CM | POA: Diagnosis not present

## 2021-05-24 DIAGNOSIS — I10 Essential (primary) hypertension: Secondary | ICD-10-CM | POA: Diagnosis not present

## 2021-05-24 DIAGNOSIS — M4626 Osteomyelitis of vertebra, lumbar region: Secondary | ICD-10-CM | POA: Diagnosis not present

## 2021-05-24 DIAGNOSIS — E039 Hypothyroidism, unspecified: Secondary | ICD-10-CM | POA: Diagnosis not present

## 2021-05-24 DIAGNOSIS — E041 Nontoxic single thyroid nodule: Secondary | ICD-10-CM | POA: Diagnosis not present

## 2021-05-24 DIAGNOSIS — M4646 Discitis, unspecified, lumbar region: Secondary | ICD-10-CM

## 2021-05-24 DIAGNOSIS — M4316 Spondylolisthesis, lumbar region: Secondary | ICD-10-CM | POA: Diagnosis not present

## 2021-05-24 DIAGNOSIS — M170 Bilateral primary osteoarthritis of knee: Secondary | ICD-10-CM | POA: Diagnosis not present

## 2021-05-24 DIAGNOSIS — E7800 Pure hypercholesterolemia, unspecified: Secondary | ICD-10-CM | POA: Diagnosis not present

## 2021-05-26 ENCOUNTER — Other Ambulatory Visit: Payer: Self-pay | Admitting: Infectious Diseases

## 2021-05-28 DIAGNOSIS — E041 Nontoxic single thyroid nodule: Secondary | ICD-10-CM | POA: Diagnosis not present

## 2021-05-28 DIAGNOSIS — M170 Bilateral primary osteoarthritis of knee: Secondary | ICD-10-CM | POA: Diagnosis not present

## 2021-05-28 DIAGNOSIS — D649 Anemia, unspecified: Secondary | ICD-10-CM | POA: Diagnosis not present

## 2021-05-28 DIAGNOSIS — M4626 Osteomyelitis of vertebra, lumbar region: Secondary | ICD-10-CM | POA: Diagnosis not present

## 2021-05-28 DIAGNOSIS — E039 Hypothyroidism, unspecified: Secondary | ICD-10-CM | POA: Diagnosis not present

## 2021-05-28 DIAGNOSIS — B9620 Unspecified Escherichia coli [E. coli] as the cause of diseases classified elsewhere: Secondary | ICD-10-CM | POA: Diagnosis not present

## 2021-05-28 DIAGNOSIS — M4316 Spondylolisthesis, lumbar region: Secondary | ICD-10-CM | POA: Diagnosis not present

## 2021-05-28 DIAGNOSIS — I10 Essential (primary) hypertension: Secondary | ICD-10-CM | POA: Diagnosis not present

## 2021-05-28 DIAGNOSIS — E7800 Pure hypercholesterolemia, unspecified: Secondary | ICD-10-CM | POA: Diagnosis not present

## 2021-05-31 ENCOUNTER — Other Ambulatory Visit: Payer: Medicare PPO

## 2021-05-31 ENCOUNTER — Other Ambulatory Visit: Payer: Self-pay

## 2021-05-31 DIAGNOSIS — M4646 Discitis, unspecified, lumbar region: Secondary | ICD-10-CM

## 2021-06-01 ENCOUNTER — Ambulatory Visit: Payer: Medicare PPO

## 2021-06-01 LAB — BASIC METABOLIC PANEL
BUN/Creatinine Ratio: 20 (calc) (ref 6–22)
BUN: 23 mg/dL (ref 7–25)
CO2: 29 mmol/L (ref 20–32)
Calcium: 9 mg/dL (ref 8.6–10.4)
Chloride: 102 mmol/L (ref 98–110)
Creat: 1.15 mg/dL — ABNORMAL HIGH (ref 0.60–1.00)
Glucose, Bld: 93 mg/dL (ref 65–99)
Potassium: 4.2 mmol/L (ref 3.5–5.3)
Sodium: 139 mmol/L (ref 135–146)

## 2021-06-02 DIAGNOSIS — M170 Bilateral primary osteoarthritis of knee: Secondary | ICD-10-CM | POA: Diagnosis not present

## 2021-06-02 DIAGNOSIS — E7800 Pure hypercholesterolemia, unspecified: Secondary | ICD-10-CM | POA: Diagnosis not present

## 2021-06-02 DIAGNOSIS — E041 Nontoxic single thyroid nodule: Secondary | ICD-10-CM | POA: Diagnosis not present

## 2021-06-02 DIAGNOSIS — B9620 Unspecified Escherichia coli [E. coli] as the cause of diseases classified elsewhere: Secondary | ICD-10-CM | POA: Diagnosis not present

## 2021-06-02 DIAGNOSIS — E039 Hypothyroidism, unspecified: Secondary | ICD-10-CM | POA: Diagnosis not present

## 2021-06-02 DIAGNOSIS — D649 Anemia, unspecified: Secondary | ICD-10-CM | POA: Diagnosis not present

## 2021-06-02 DIAGNOSIS — M4626 Osteomyelitis of vertebra, lumbar region: Secondary | ICD-10-CM | POA: Diagnosis not present

## 2021-06-02 DIAGNOSIS — I10 Essential (primary) hypertension: Secondary | ICD-10-CM | POA: Diagnosis not present

## 2021-06-02 DIAGNOSIS — M4316 Spondylolisthesis, lumbar region: Secondary | ICD-10-CM | POA: Diagnosis not present

## 2021-06-09 DIAGNOSIS — I10 Essential (primary) hypertension: Secondary | ICD-10-CM | POA: Diagnosis not present

## 2021-06-09 DIAGNOSIS — D649 Anemia, unspecified: Secondary | ICD-10-CM | POA: Diagnosis not present

## 2021-06-09 DIAGNOSIS — E039 Hypothyroidism, unspecified: Secondary | ICD-10-CM | POA: Diagnosis not present

## 2021-06-09 DIAGNOSIS — E041 Nontoxic single thyroid nodule: Secondary | ICD-10-CM | POA: Diagnosis not present

## 2021-06-09 DIAGNOSIS — M4316 Spondylolisthesis, lumbar region: Secondary | ICD-10-CM | POA: Diagnosis not present

## 2021-06-09 DIAGNOSIS — E7800 Pure hypercholesterolemia, unspecified: Secondary | ICD-10-CM | POA: Diagnosis not present

## 2021-06-09 DIAGNOSIS — B9620 Unspecified Escherichia coli [E. coli] as the cause of diseases classified elsewhere: Secondary | ICD-10-CM | POA: Diagnosis not present

## 2021-06-09 DIAGNOSIS — M4626 Osteomyelitis of vertebra, lumbar region: Secondary | ICD-10-CM | POA: Diagnosis not present

## 2021-06-09 DIAGNOSIS — M170 Bilateral primary osteoarthritis of knee: Secondary | ICD-10-CM | POA: Diagnosis not present

## 2021-06-11 DIAGNOSIS — E041 Nontoxic single thyroid nodule: Secondary | ICD-10-CM | POA: Diagnosis not present

## 2021-06-11 DIAGNOSIS — I10 Essential (primary) hypertension: Secondary | ICD-10-CM | POA: Diagnosis not present

## 2021-06-11 DIAGNOSIS — B9620 Unspecified Escherichia coli [E. coli] as the cause of diseases classified elsewhere: Secondary | ICD-10-CM | POA: Diagnosis not present

## 2021-06-11 DIAGNOSIS — M4316 Spondylolisthesis, lumbar region: Secondary | ICD-10-CM | POA: Diagnosis not present

## 2021-06-11 DIAGNOSIS — M170 Bilateral primary osteoarthritis of knee: Secondary | ICD-10-CM | POA: Diagnosis not present

## 2021-06-11 DIAGNOSIS — E7800 Pure hypercholesterolemia, unspecified: Secondary | ICD-10-CM | POA: Diagnosis not present

## 2021-06-11 DIAGNOSIS — D649 Anemia, unspecified: Secondary | ICD-10-CM | POA: Diagnosis not present

## 2021-06-11 DIAGNOSIS — E039 Hypothyroidism, unspecified: Secondary | ICD-10-CM | POA: Diagnosis not present

## 2021-06-11 DIAGNOSIS — M4626 Osteomyelitis of vertebra, lumbar region: Secondary | ICD-10-CM | POA: Diagnosis not present

## 2021-06-14 DIAGNOSIS — I10 Essential (primary) hypertension: Secondary | ICD-10-CM | POA: Diagnosis not present

## 2021-06-14 DIAGNOSIS — E041 Nontoxic single thyroid nodule: Secondary | ICD-10-CM | POA: Diagnosis not present

## 2021-06-14 DIAGNOSIS — E7800 Pure hypercholesterolemia, unspecified: Secondary | ICD-10-CM | POA: Diagnosis not present

## 2021-06-14 DIAGNOSIS — B9620 Unspecified Escherichia coli [E. coli] as the cause of diseases classified elsewhere: Secondary | ICD-10-CM | POA: Diagnosis not present

## 2021-06-14 DIAGNOSIS — D649 Anemia, unspecified: Secondary | ICD-10-CM | POA: Diagnosis not present

## 2021-06-14 DIAGNOSIS — M170 Bilateral primary osteoarthritis of knee: Secondary | ICD-10-CM | POA: Diagnosis not present

## 2021-06-14 DIAGNOSIS — M4316 Spondylolisthesis, lumbar region: Secondary | ICD-10-CM | POA: Diagnosis not present

## 2021-06-14 DIAGNOSIS — M4626 Osteomyelitis of vertebra, lumbar region: Secondary | ICD-10-CM | POA: Diagnosis not present

## 2021-06-14 DIAGNOSIS — E039 Hypothyroidism, unspecified: Secondary | ICD-10-CM | POA: Diagnosis not present

## 2021-06-15 DIAGNOSIS — M170 Bilateral primary osteoarthritis of knee: Secondary | ICD-10-CM | POA: Diagnosis not present

## 2021-06-15 DIAGNOSIS — E7800 Pure hypercholesterolemia, unspecified: Secondary | ICD-10-CM | POA: Diagnosis not present

## 2021-06-15 DIAGNOSIS — M4626 Osteomyelitis of vertebra, lumbar region: Secondary | ICD-10-CM | POA: Diagnosis not present

## 2021-06-15 DIAGNOSIS — D649 Anemia, unspecified: Secondary | ICD-10-CM | POA: Diagnosis not present

## 2021-06-15 DIAGNOSIS — E041 Nontoxic single thyroid nodule: Secondary | ICD-10-CM | POA: Diagnosis not present

## 2021-06-15 DIAGNOSIS — I10 Essential (primary) hypertension: Secondary | ICD-10-CM | POA: Diagnosis not present

## 2021-06-15 DIAGNOSIS — E039 Hypothyroidism, unspecified: Secondary | ICD-10-CM | POA: Diagnosis not present

## 2021-06-15 DIAGNOSIS — B9620 Unspecified Escherichia coli [E. coli] as the cause of diseases classified elsewhere: Secondary | ICD-10-CM | POA: Diagnosis not present

## 2021-06-15 DIAGNOSIS — M4316 Spondylolisthesis, lumbar region: Secondary | ICD-10-CM | POA: Diagnosis not present

## 2021-06-19 ENCOUNTER — Ambulatory Visit (INDEPENDENT_AMBULATORY_CARE_PROVIDER_SITE_OTHER): Payer: Medicare PPO | Admitting: Infectious Diseases

## 2021-06-19 ENCOUNTER — Other Ambulatory Visit: Payer: Self-pay

## 2021-06-19 ENCOUNTER — Encounter: Payer: Self-pay | Admitting: Infectious Diseases

## 2021-06-19 VITALS — BP 165/85 | HR 54 | Temp 98.2°F | Ht 66.0 in | Wt 248.0 lb

## 2021-06-19 DIAGNOSIS — Z5181 Encounter for therapeutic drug level monitoring: Secondary | ICD-10-CM | POA: Diagnosis not present

## 2021-06-19 DIAGNOSIS — B3731 Acute candidiasis of vulva and vagina: Secondary | ICD-10-CM | POA: Insufficient documentation

## 2021-06-19 DIAGNOSIS — B373 Candidiasis of vulva and vagina: Secondary | ICD-10-CM

## 2021-06-19 DIAGNOSIS — M4646 Discitis, unspecified, lumbar region: Secondary | ICD-10-CM

## 2021-06-19 MED ORDER — FLUCONAZOLE 150 MG PO TABS: 150.0000 mg | ORAL_TABLET | Freq: Every day | ORAL | 0 refills | Status: DC

## 2021-06-19 NOTE — Progress Notes (Addendum)
[facility]                                                             [address], [place], [place], [address]                                                                  Phn. [phone]; Fax: [phone]                                                                             Date: 06/19/21  Reason for follow up: discitis and intra discal abscess   Assessment/plan Problem List Items Addressed This Visit       Musculoskeletal and Integument   Discitis/osteomyelitis of lumbar region - Primary     Genitourinary   Vaginal candidiasis   Relevant Medications   fluconazole (DIFLUCAN) 150 MG tablet     Other   Medication monitoring encounter    Discitis/osteomyelitis at L1-L2 with paraspinal edema Possible intradiscal abscess extending into the right psoas -Status post CT-guided aspiration on 3/27 with cultures growing E. Coli. Repeat MRL L spine 5/26 with improved findings  -Completed IV cefazolin on 6/7 and started on amoxicillin thereafter which she has been taking till today -Back pain is resolved and inflammatory markers are down-trending   Plan Will DC amoxicillin Will prescribe Fluconazole for possible vulvovaginal candidiasis Follow up with us as needed   All questions and concerns were discussed and addressed. Patient verbalized understanding of the plan. ____________________________________________________________________________________________________________________  HPI: 70 year old female with past medical history of hypertension, hypothyroidism who is here for HFU after recent hospital admission 3/26-4/6 for Lumbar discitis/OM and possible intradiscal abscess extending into RT psoas. MRI LS 3/25 showed discitis/osteomyelitis at L1-L2 with paraspinal edema. Intradiscal abscess extending into the right psoas is not excluded.Evaluated by [facility] in the hospital  and underwent CT CT-guided aspiration on 3/27 with cultures growing E. Coli. Seen by ID, initially on daptomycin and ceftriaxone which was de-escalated to cefazolin after sensi's. Planned for 6 weeks, end date 03/11/21. She was discharged to a SNF on 02/07/21   02/14/21 Doing well. Back pain has improved from 10 to 4. Denies fevers, chills and sweats. Denies nausea, vomiting, abdominal pain and diarrhea. She is working with PT and is able to stand without support. She feels she is getting much stronger. Getting IV cefazolin as instructed. No issues with PICC line or new complains. She will follow up with me in 3 weeks before he end date of antibiotics after her MRI Lumbar spine is done. She verbalised understanding to the plan.   03/09/21 Doing well. Back pain is down to 1-2/10. She is able to walk with the help of walker. Discussed recent MRL spine results and need to continue  2-3 more weeks of IV abx and plan to repeat MRI spine in 2 weeks before discontinuing antibiotics to which she agreed. No issues with antibiotics like nausea, vomiting, rashes and diarrhea. No issues with the PICC line. She is pleased with her progress and very appreciative of the tx.  04/10/21 Here for follow-up of lumbar discitis/osteomyelitis.  She has been getting IV cefazolin from her PICC line.  Denies any issues with the PICC line.  Denies any issues with the antibiotics like nausea vomiting diarrhea rashes.  Denies fevers chills and sweats.  Back pain is essentially improved, she has some discomfort when getting out of bed onto chair.  She is working with physical therapy and thinks her strength is getting back.  She is able to walk with the help of walker.  I discussed with her regarding her recent MRI lumbar spine findings and plan to DC PICC line and IV antibiotics and start p.o. amoxicillin.  She denies any allergies to penicillin in the past.  She will follow-up with me in 3 weeks.  04/24/21 Here for follow-up of lumbar  discitis/osteomyelitis.  She is taking amoxicillin orally 1 g 3 times a day.  Denies missing any doses.  Denies any side effects with antibiotics like nausea vomiting, diarrhea or rashes.  Denies any fever chills and sweats.  Back pain has improved to 2 out of 10.  She is able to walk briefly without the help of walker and without support.  She is discharged to home from her facility.  She is happy with her progress and denies any complaints today.  05/08/21 Here for follow up of lumbar discitis/osteomyelitis. Amoxicillin dosing was changed from 1g TID dosing to  BID dosing last time given mild elevation of Cr. Last Cr on 6/29 has downtrended after BID dosing. She denies any issues with the antibiotics like nausea, vomiting and diarrhea. Back pain is almost gone and she says sometime she has back pain around 1-2/10 when she moves around. She has also started walking with a cane now. Prior she used to use a walker. She showed me a dark discoloration at her rt hand knuckle which is not itching, non tender and was asking if this is related to the antibiotics which I think is unrelated. Discussed with her last inflammatory markers still being high and plan to get inflammatory markers today.   06/19/21 Doing well. Back pain has improved. She occasionally has back pain when she moves around but otherwise no back pain.  Taking amoxicillin twice a day. Denies nausea, vomiting, abdominal pain and diarrhea. Started having itching in the vagina. She is working with PT and her strength has improved. She is staying with her cousin. Overall feels well and no complaints today.   ROS: 12 point ROS done with pertinent positives and negative listed above   Past Medical History:  Diagnosis Date   Complication of anesthesia    hard to wake up after eye surgery in 1970s   Goiter    with thyroid nodule, FNA 12/15 benign follicular nodule   Hypercholesterolemia    Hypertension    Hypothyroidism    OA (osteoarthritis) of  knee    mod.   Spondylolisthesis of lumbar region    Past Surgical History:  Procedure Laterality Date   COLONOSCOPY     EYE SURGERY     THYROID SURGERY  1999   Current Outpatient Medications on File Prior to Visit  Medication Sig Dispense Refill   aspirin 81 MG chewable  tablet Chew 81 mg by mouth every morning.     CALCIUM PO Take 1,000 mg by mouth.     carvedilol (COREG) 12.5 MG tablet Take 12.5 mg by mouth 2 (two) times daily.     cholecalciferol (VITAMIN D) 1000 UNITS tablet Take 1,000 Units by mouth 2 (two) times daily.     ezetimibe (ZETIA) 10 MG tablet Take 10 mg by mouth at bedtime.     ferrous sulfate 325 (65 FE) MG tablet Take 325 mg by mouth daily with breakfast.     furosemide (LASIX) 40 MG tablet Take 40 mg by mouth.     levothyroxine (SYNTHROID, LEVOTHROID) 100 MCG tablet Take 100 mcg by mouth daily at 6 (six) AM.     Magnesium 250 MG TABS Take 500 mg by mouth every morning.     potassium chloride (KLOR-CON) 10 MEQ tablet Take 20 mEq by mouth daily.     tetrahydrozoline 0.05 % ophthalmic solution Place 1 drop into both eyes daily as needed (dry eyes).     tiZANidine (ZANAFLEX) 2 MG tablet Take 1 tablet (2 mg total) by mouth every 8 (eight) hours as needed for muscle spasms. 21 tablet 0   vitamin C (ASCORBIC ACID) 500 MG tablet Take 500 mg by mouth daily.     No current facility-administered medications on file prior to visit.    Allergies  Allergen Reactions   Atorvastatin     Other reaction(s): muscle aches   Influenza Vac Recombinant Ha Trivalent [Influenza Vaccine Recombinant]     Other reaction(s): severe reaction in 1970s--skin pealed   Rosuvastatin     Other reaction(s): muslce aches    Social History   Socioeconomic History   Marital status: Divorced    Spouse name: Not on file   Number of children: Not on file   Years of education: Not on file   Highest education level: Not on file  Occupational History   Not on file  Tobacco Use   Smoking  status: Never   Smokeless tobacco: Never  Vaping Use   Vaping Use: Never used  Substance and Sexual Activity   Alcohol use: No   Drug use: No   Sexual activity: Not on file  Other Topics Concern   Not on file  Social History Narrative   Not on file   Social Determinants of Health   Financial Resource Strain: Not on file  Food Insecurity: Not on file  Transportation Needs: Not on file  Physical Activity: Not on file  Stress: Not on file  Social Connections: Not on file  Intimate Partner Violence: Not on file    Vitals Ht  (1.676 m)   Wt 248 lb (112.5 kg)   BMI 40.03 kg/m '  Examination  General - not in acute distress, comfortably sitting in  chair HEENT - PEERLA, no pallor and no icterus Chest - b/l clear air entry, no additional sounds CVS- Normal s1s2, RRR Abdomen - Soft, Non tender , non distended Ext- no pedal edema Neuro: grossly normal Back - WNL, no vertebral tenderness, brace at the back  Psych : calm and cooperative   Recent labs CBC Latest Ref Rng & Units 02/06/2021 02/04/2021 02/01/2021  WBC 4.0 - 10.5 K/uL 7.6 7.3 7.4  Hemoglobin 12.0 - 15.0 g/dL 4.5(W) 0.9(W) 1.1(B)  Hematocrit 36.0 - 46.0 % 26.3(L) 26.0(L) 26.0(L)  Platelets 150 - 400 K/uL 277 259 244    CMP Latest Ref Rng & Units 05/31/2021 05/18/2021 05/02/2021  Glucose 65 - 99 mg/dL 93 87 90  BUN 7 - 25 mg/dL Creatinine 0.60 - 1.00 mg/dL 1.61(W) 9.60(A) 5.40(J)  Sodium 135 - 146 mmol/L 139 141 137  Potassium 3.5 - 5.3 mmol/L 4.2 4.6 3.9  Chloride 98 - 110 mmol/L 102 103 101  CO2 20 - 32 mmol/L Calcium 8.6 - 10.4 mg/dL 9.0 9.5 9.0  Total Protein 6.5 - 8.1 g/dL - - -  Total Bilirubin 0.3 - 1.2 mg/dL - - -  Alkaline Phos 38 - 126 U/L - - -  AST 15 - 41 U/L - - -  ALT 0 - 44 U/L - - -    Pertinent Microbiology Results for orders placed or performed during the hospital encounter of 01/27/21  Culture, blood (routine x 2)     Status: None   Collection Time: 01/27/21  6:33  PM   Specimen: BLOOD  Result Value Ref Range Status   Specimen Description BLOOD LEFT ANTECUBITAL  Final   Special Requests   Final    BOTTLES DRAWN AEROBIC AND ANAEROBIC Blood Culture adequate volume   Culture   Final    NO GROWTH 5 DAYS Performed at [facility], [address] [place], [place] [address]    Report Status 02/01/2021 FINAL  Final  Resp Panel by RT-PCR (Flu A&B, Covid) Nasopharyngeal Swab     Status: None   Collection Time: 01/27/21  6:33 PM   Specimen: Nasopharyngeal Swab; Nasopharyngeal(NP) swabs in vial transport medium  Result Value Ref Range Status   SARS Coronavirus 2 by RT PCR NEGATIVE NEGATIVE Final    Comment: (NOTE) SARS-CoV-2 target nucleic acids are NOT DETECTED.  The SARS-CoV-2 RNA is generally detectable in upper respiratory specimens during the acute phase of infection. The lowest concentration of SARS-CoV-2 viral copies this assay can detect is 138 copies/mL. A negative result does not preclude SARS-Cov-2 infection and should not be used as the sole basis for treatment or other patient management decisions. A negative result may occur with  improper specimen collection/handling, submission of specimen other than nasopharyngeal swab, presence of viral mutation(s) within the areas targeted by this assay, and inadequate number of viral copies(<138 copies/mL). A negative result must be combined with clinical observations, patient history, and epidemiological information. The expected result is Negative.  Fact Sheet for Patients:  BloggerCourse.com  Fact Sheet for Healthcare Providers:  SeriousBroker.it  This test is no t yet approved or cleared by the [place] FDA and  has been authorized for detection and/or diagnosis of SARS-CoV-2 by FDA under an Emergency Use Authorization (EUA). This EUA will remain  in effect (meaning this test can be used) for the duration of the COVID-19  declaration under Section 564(b)(1) of the Act, 21 U.S.C.section 360bbb-3(b)(1), unless the authorization is terminated  or revoked sooner.       Influenza A by PCR NEGATIVE NEGATIVE Final   Influenza B by PCR NEGATIVE NEGATIVE Final    Comment: (NOTE) The Xpert Xpress SARS-CoV-2/FLU/RSV plus assay is intended as an aid in the diagnosis of influenza from Nasopharyngeal swab specimens and should not be used as a sole basis for treatment. Nasal washings and aspirates are unacceptable for Xpert Xpress SARS-CoV-2/FLU/RSV testing.  Fact Sheet for Patients: BloggerCourse.com  Fact Sheet for Healthcare Providers: SeriousBroker.it  This test is not yet approved or cleared by the [place] FDA and has been authorized for detection and/or diagnosis of SARS-CoV-2 by FDA under  an Emergency Use Authorization (EUA). This EUA will remain in effect (meaning this test can be used) for the duration of the COVID-19 declaration under Section 564(b)(1) of the Act, 21 U.S.C. section 360bbb-3(b)(1), unless the authorization is terminated or revoked.  Performed at [facility], [address], [place], [place] [address]   Urine culture     Status: Abnormal   Collection Time: 01/27/21  6:33 PM   Specimen: Urine, Random  Result Value Ref Range Status   Specimen Description URINE, RANDOM  Final   Special Requests   Final    NONE Performed at [facility], [address], [place], [place] [address]    Culture MULTIPLE SPECIES PRESENT, SUGGEST RECOLLECTION (A)  Final   Report Status 01/29/2021 FINAL  Final  Aerobic/Anaerobic Culture (surgical/deep wound)     Status: None   Collection Time: 01/28/21 10:47 AM   Specimen: Fluid; Tissue  Result Value Ref Range Status   Specimen Description FLUID L1/L2 INTERVERTEBRAL DISC AND PARASPINAL  Final   Special Requests NONE  Final   Gram Stain   Final    MODERATE WBC PRESENT, PREDOMINANTLY  PMN NO ORGANISMS SEEN    Culture   Final    RARE ESCHERICHIA COLI NO ANAEROBES ISOLATED Performed at [facility], [address], [place], [place] [address]    Report Status 02/02/2021 FINAL  Final   Organism ID, Bacteria ESCHERICHIA COLI  Final      Susceptibility   Escherichia coli - MIC*    AMPICILLIN 4 SENSITIVE Sensitive     CEFAZOLIN <=4 SENSITIVE Sensitive     CEFEPIME <=0.12 SENSITIVE Sensitive     CEFTAZIDIME <=1 SENSITIVE Sensitive     CEFTRIAXONE <=0.25 SENSITIVE Sensitive     CIPROFLOXACIN <=0.25 SENSITIVE Sensitive     GENTAMICIN <=1 SENSITIVE Sensitive     IMIPENEM <=0.25 SENSITIVE Sensitive     TRIMETH/SULFA <=20 SENSITIVE Sensitive     AMPICILLIN/SULBACTAM <=2 SENSITIVE Sensitive     PIP/TAZO <=4 SENSITIVE Sensitive     * RARE ESCHERICHIA COLI  Resp Panel by RT-PCR (Flu A&B, Covid) Nasopharyngeal Swab     Status: None   Collection Time: 02/07/21  8:52 AM   Specimen: Nasopharyngeal Swab; Nasopharyngeal(NP) swabs in vial transport medium  Result Value Ref Range Status   SARS Coronavirus 2 by RT PCR NEGATIVE NEGATIVE Final    Comment: (NOTE) SARS-CoV-2 target nucleic acids are NOT DETECTED.  The SARS-CoV-2 RNA is generally detectable in upper respiratory specimens during the acute phase of infection. The lowest concentration of SARS-CoV-2 viral copies this assay can detect is 138 copies/mL. A negative result does not preclude SARS-Cov-2 infection and should not be used as the sole basis for treatment or other patient management decisions. A negative result may occur with  improper specimen collection/handling, submission of specimen other than nasopharyngeal swab, presence of viral mutation(s) within the areas targeted by this assay, and inadequate number of viral copies(<138 copies/mL). A negative result must be combined with clinical observations, patient history, and epidemiological information. The expected result is Negative.  Fact Sheet for  Patients:  BloggerCourse.com  Fact Sheet for Healthcare Providers:  SeriousBroker.it  This test is no t yet approved or cleared by the [place] FDA and  has been authorized for detection and/or diagnosis of SARS-CoV-2 by FDA under an Emergency Use Authorization (EUA). This EUA will remain  in effect (meaning this test can be used) for the duration of the COVID-19 declaration  under Section 564(b)(1) of the Act, 21 U.S.C.section 360bbb-3(b)(1), unless the authorization is terminated  or revoked sooner.       Influenza A by PCR NEGATIVE NEGATIVE Final   Influenza B by PCR NEGATIVE NEGATIVE Final    Comment: (NOTE) The Xpert Xpress SARS-CoV-2/FLU/RSV plus assay is intended as an aid in the diagnosis of influenza from Nasopharyngeal swab specimens and should not be used as a sole basis for treatment. Nasal washings and aspirates are unacceptable for Xpert Xpress SARS-CoV-2/FLU/RSV testing.  Fact Sheet for Patients: BloggerCourse.com  Fact Sheet for Healthcare Providers: SeriousBroker.it  This test is not yet approved or cleared by the [place] FDA and has been authorized for detection and/or diagnosis of SARS-CoV-2 by FDA under an Emergency Use Authorization (EUA). This EUA will remain in effect (meaning this test can be used) for the duration of the COVID-19 declaration under Section 564(b)(1) of the Act, 21 U.S.C. section 360bbb-3(b)(1), unless the authorization is terminated or revoked.  Performed at [facility], [address], [place], [place] [address]     Pertinent Imaging All pertinent labs/Imagings/notes reviewed. All pertinent plain films and CT images have been personally visualized and interpreted; radiology reports have been reviewed. Decision making incorporated into the Impression / Recommendations.  I have spent 30  minutes for this patient  encounter including  review of prior medical records with greater than 50% of time in face to face counsel of the patient/discussing diagnostics and plan of care.   Electronically signed by:  Odette Fraction, MD Infectious Disease Physician [facility] [address] [place], [place] [address] Phone: [phone]  Fax: [phone]

## 2021-06-20 DIAGNOSIS — E039 Hypothyroidism, unspecified: Secondary | ICD-10-CM | POA: Diagnosis not present

## 2021-06-20 DIAGNOSIS — D649 Anemia, unspecified: Secondary | ICD-10-CM | POA: Diagnosis not present

## 2021-06-20 DIAGNOSIS — E041 Nontoxic single thyroid nodule: Secondary | ICD-10-CM | POA: Diagnosis not present

## 2021-06-20 DIAGNOSIS — M4626 Osteomyelitis of vertebra, lumbar region: Secondary | ICD-10-CM | POA: Diagnosis not present

## 2021-06-20 DIAGNOSIS — B9620 Unspecified Escherichia coli [E. coli] as the cause of diseases classified elsewhere: Secondary | ICD-10-CM | POA: Diagnosis not present

## 2021-06-20 DIAGNOSIS — M170 Bilateral primary osteoarthritis of knee: Secondary | ICD-10-CM | POA: Diagnosis not present

## 2021-06-20 DIAGNOSIS — E7800 Pure hypercholesterolemia, unspecified: Secondary | ICD-10-CM | POA: Diagnosis not present

## 2021-06-20 DIAGNOSIS — I10 Essential (primary) hypertension: Secondary | ICD-10-CM | POA: Diagnosis not present

## 2021-06-20 DIAGNOSIS — M4316 Spondylolisthesis, lumbar region: Secondary | ICD-10-CM | POA: Diagnosis not present

## 2021-06-26 DIAGNOSIS — M170 Bilateral primary osteoarthritis of knee: Secondary | ICD-10-CM | POA: Diagnosis not present

## 2021-06-26 DIAGNOSIS — M4316 Spondylolisthesis, lumbar region: Secondary | ICD-10-CM | POA: Diagnosis not present

## 2021-06-26 DIAGNOSIS — B9620 Unspecified Escherichia coli [E. coli] as the cause of diseases classified elsewhere: Secondary | ICD-10-CM | POA: Diagnosis not present

## 2021-06-26 DIAGNOSIS — E041 Nontoxic single thyroid nodule: Secondary | ICD-10-CM | POA: Diagnosis not present

## 2021-06-26 DIAGNOSIS — D649 Anemia, unspecified: Secondary | ICD-10-CM | POA: Diagnosis not present

## 2021-06-26 DIAGNOSIS — E7800 Pure hypercholesterolemia, unspecified: Secondary | ICD-10-CM | POA: Diagnosis not present

## 2021-06-26 DIAGNOSIS — E039 Hypothyroidism, unspecified: Secondary | ICD-10-CM | POA: Diagnosis not present

## 2021-06-26 DIAGNOSIS — M4626 Osteomyelitis of vertebra, lumbar region: Secondary | ICD-10-CM | POA: Diagnosis not present

## 2021-06-26 DIAGNOSIS — I10 Essential (primary) hypertension: Secondary | ICD-10-CM | POA: Diagnosis not present

## 2021-06-27 DIAGNOSIS — Z6839 Body mass index (BMI) 39.0-39.9, adult: Secondary | ICD-10-CM | POA: Diagnosis not present

## 2021-06-27 DIAGNOSIS — M4620 Osteomyelitis of vertebra, site unspecified: Secondary | ICD-10-CM | POA: Diagnosis not present

## 2021-06-27 DIAGNOSIS — M4646 Discitis, unspecified, lumbar region: Secondary | ICD-10-CM | POA: Diagnosis not present

## 2021-06-27 DIAGNOSIS — I10 Essential (primary) hypertension: Secondary | ICD-10-CM | POA: Diagnosis not present

## 2021-07-03 DIAGNOSIS — M170 Bilateral primary osteoarthritis of knee: Secondary | ICD-10-CM | POA: Diagnosis not present

## 2021-07-03 DIAGNOSIS — I10 Essential (primary) hypertension: Secondary | ICD-10-CM | POA: Diagnosis not present

## 2021-07-03 DIAGNOSIS — E039 Hypothyroidism, unspecified: Secondary | ICD-10-CM | POA: Diagnosis not present

## 2021-07-03 DIAGNOSIS — B9620 Unspecified Escherichia coli [E. coli] as the cause of diseases classified elsewhere: Secondary | ICD-10-CM | POA: Diagnosis not present

## 2021-07-03 DIAGNOSIS — E7800 Pure hypercholesterolemia, unspecified: Secondary | ICD-10-CM | POA: Diagnosis not present

## 2021-07-03 DIAGNOSIS — D649 Anemia, unspecified: Secondary | ICD-10-CM | POA: Diagnosis not present

## 2021-07-03 DIAGNOSIS — M4316 Spondylolisthesis, lumbar region: Secondary | ICD-10-CM | POA: Diagnosis not present

## 2021-07-03 DIAGNOSIS — E041 Nontoxic single thyroid nodule: Secondary | ICD-10-CM | POA: Diagnosis not present

## 2021-07-03 DIAGNOSIS — M4626 Osteomyelitis of vertebra, lumbar region: Secondary | ICD-10-CM | POA: Diagnosis not present

## 2021-07-10 DIAGNOSIS — M4316 Spondylolisthesis, lumbar region: Secondary | ICD-10-CM | POA: Diagnosis not present

## 2021-07-10 DIAGNOSIS — M170 Bilateral primary osteoarthritis of knee: Secondary | ICD-10-CM | POA: Diagnosis not present

## 2021-07-10 DIAGNOSIS — E039 Hypothyroidism, unspecified: Secondary | ICD-10-CM | POA: Diagnosis not present

## 2021-07-10 DIAGNOSIS — I10 Essential (primary) hypertension: Secondary | ICD-10-CM | POA: Diagnosis not present

## 2021-07-10 DIAGNOSIS — E041 Nontoxic single thyroid nodule: Secondary | ICD-10-CM | POA: Diagnosis not present

## 2021-07-10 DIAGNOSIS — B9620 Unspecified Escherichia coli [E. coli] as the cause of diseases classified elsewhere: Secondary | ICD-10-CM | POA: Diagnosis not present

## 2021-07-10 DIAGNOSIS — M4626 Osteomyelitis of vertebra, lumbar region: Secondary | ICD-10-CM | POA: Diagnosis not present

## 2021-07-10 DIAGNOSIS — D649 Anemia, unspecified: Secondary | ICD-10-CM | POA: Diagnosis not present

## 2021-07-10 DIAGNOSIS — E7800 Pure hypercholesterolemia, unspecified: Secondary | ICD-10-CM | POA: Diagnosis not present

## 2021-07-12 DIAGNOSIS — M4646 Discitis, unspecified, lumbar region: Secondary | ICD-10-CM | POA: Diagnosis not present

## 2021-07-12 DIAGNOSIS — Z7409 Other reduced mobility: Secondary | ICD-10-CM | POA: Diagnosis not present

## 2021-07-19 DIAGNOSIS — Z7409 Other reduced mobility: Secondary | ICD-10-CM | POA: Diagnosis not present

## 2021-07-19 DIAGNOSIS — M4646 Discitis, unspecified, lumbar region: Secondary | ICD-10-CM | POA: Diagnosis not present

## 2021-07-24 DIAGNOSIS — Z7409 Other reduced mobility: Secondary | ICD-10-CM | POA: Diagnosis not present

## 2021-07-24 DIAGNOSIS — M4646 Discitis, unspecified, lumbar region: Secondary | ICD-10-CM | POA: Diagnosis not present

## 2021-07-31 DIAGNOSIS — M4646 Discitis, unspecified, lumbar region: Secondary | ICD-10-CM | POA: Diagnosis not present

## 2021-07-31 DIAGNOSIS — Z7409 Other reduced mobility: Secondary | ICD-10-CM | POA: Diagnosis not present

## 2021-08-02 DIAGNOSIS — M4646 Discitis, unspecified, lumbar region: Secondary | ICD-10-CM | POA: Diagnosis not present

## 2021-08-02 DIAGNOSIS — Z7409 Other reduced mobility: Secondary | ICD-10-CM | POA: Diagnosis not present

## 2021-08-06 DIAGNOSIS — M4646 Discitis, unspecified, lumbar region: Secondary | ICD-10-CM | POA: Diagnosis not present

## 2021-08-06 DIAGNOSIS — R531 Weakness: Secondary | ICD-10-CM | POA: Diagnosis not present

## 2021-08-06 DIAGNOSIS — Z7409 Other reduced mobility: Secondary | ICD-10-CM | POA: Diagnosis not present

## 2021-08-07 ENCOUNTER — Other Ambulatory Visit: Payer: Self-pay | Admitting: Family Medicine

## 2021-08-07 DIAGNOSIS — Z1231 Encounter for screening mammogram for malignant neoplasm of breast: Secondary | ICD-10-CM

## 2021-08-08 DIAGNOSIS — R531 Weakness: Secondary | ICD-10-CM | POA: Diagnosis not present

## 2021-08-08 DIAGNOSIS — Z7409 Other reduced mobility: Secondary | ICD-10-CM | POA: Diagnosis not present

## 2021-08-08 DIAGNOSIS — M4646 Discitis, unspecified, lumbar region: Secondary | ICD-10-CM | POA: Diagnosis not present

## 2021-08-13 DIAGNOSIS — R531 Weakness: Secondary | ICD-10-CM | POA: Diagnosis not present

## 2021-08-13 DIAGNOSIS — Z7409 Other reduced mobility: Secondary | ICD-10-CM | POA: Diagnosis not present

## 2021-08-13 DIAGNOSIS — M4646 Discitis, unspecified, lumbar region: Secondary | ICD-10-CM | POA: Diagnosis not present

## 2021-08-15 DIAGNOSIS — E039 Hypothyroidism, unspecified: Secondary | ICD-10-CM | POA: Diagnosis not present

## 2021-08-15 DIAGNOSIS — Z0000 Encounter for general adult medical examination without abnormal findings: Secondary | ICD-10-CM | POA: Diagnosis not present

## 2021-08-15 DIAGNOSIS — M4646 Discitis, unspecified, lumbar region: Secondary | ICD-10-CM | POA: Diagnosis not present

## 2021-08-15 DIAGNOSIS — D509 Iron deficiency anemia, unspecified: Secondary | ICD-10-CM | POA: Diagnosis not present

## 2021-08-15 DIAGNOSIS — I10 Essential (primary) hypertension: Secondary | ICD-10-CM | POA: Diagnosis not present

## 2021-08-15 DIAGNOSIS — E785 Hyperlipidemia, unspecified: Secondary | ICD-10-CM | POA: Diagnosis not present

## 2021-08-21 DIAGNOSIS — R531 Weakness: Secondary | ICD-10-CM | POA: Diagnosis not present

## 2021-08-21 DIAGNOSIS — M4646 Discitis, unspecified, lumbar region: Secondary | ICD-10-CM | POA: Diagnosis not present

## 2021-08-21 DIAGNOSIS — Z7409 Other reduced mobility: Secondary | ICD-10-CM | POA: Diagnosis not present

## 2021-08-24 DIAGNOSIS — R531 Weakness: Secondary | ICD-10-CM | POA: Diagnosis not present

## 2021-08-24 DIAGNOSIS — M4646 Discitis, unspecified, lumbar region: Secondary | ICD-10-CM | POA: Diagnosis not present

## 2021-08-24 DIAGNOSIS — Z7409 Other reduced mobility: Secondary | ICD-10-CM | POA: Diagnosis not present

## 2021-08-28 DIAGNOSIS — M4646 Discitis, unspecified, lumbar region: Secondary | ICD-10-CM | POA: Diagnosis not present

## 2021-08-28 DIAGNOSIS — R531 Weakness: Secondary | ICD-10-CM | POA: Diagnosis not present

## 2021-08-28 DIAGNOSIS — Z7409 Other reduced mobility: Secondary | ICD-10-CM | POA: Diagnosis not present

## 2021-08-30 DIAGNOSIS — R531 Weakness: Secondary | ICD-10-CM | POA: Diagnosis not present

## 2021-08-30 DIAGNOSIS — M4646 Discitis, unspecified, lumbar region: Secondary | ICD-10-CM | POA: Diagnosis not present

## 2021-08-30 DIAGNOSIS — Z7409 Other reduced mobility: Secondary | ICD-10-CM | POA: Diagnosis not present

## 2021-09-04 DIAGNOSIS — Z7409 Other reduced mobility: Secondary | ICD-10-CM | POA: Diagnosis not present

## 2021-09-04 DIAGNOSIS — R531 Weakness: Secondary | ICD-10-CM | POA: Diagnosis not present

## 2021-09-04 DIAGNOSIS — M4646 Discitis, unspecified, lumbar region: Secondary | ICD-10-CM | POA: Diagnosis not present

## 2021-09-05 ENCOUNTER — Other Ambulatory Visit: Payer: Self-pay

## 2021-09-05 ENCOUNTER — Ambulatory Visit
Admission: RE | Admit: 2021-09-05 | Discharge: 2021-09-05 | Disposition: A | Discharge status: Home / Self Care | Payer: Medicare PPO | Source: Ambulatory Visit | Attending: Family Medicine | Admitting: Family Medicine

## 2021-09-05 DIAGNOSIS — Z1231 Encounter for screening mammogram for malignant neoplasm of breast: Secondary | ICD-10-CM | POA: Diagnosis not present

## 2021-09-06 DIAGNOSIS — R531 Weakness: Secondary | ICD-10-CM | POA: Diagnosis not present

## 2021-09-06 DIAGNOSIS — Z7409 Other reduced mobility: Secondary | ICD-10-CM | POA: Diagnosis not present

## 2021-09-06 DIAGNOSIS — M4646 Discitis, unspecified, lumbar region: Secondary | ICD-10-CM | POA: Diagnosis not present

## 2021-09-11 DIAGNOSIS — Z7409 Other reduced mobility: Secondary | ICD-10-CM | POA: Diagnosis not present

## 2021-09-11 DIAGNOSIS — R531 Weakness: Secondary | ICD-10-CM | POA: Diagnosis not present

## 2021-09-11 DIAGNOSIS — M4646 Discitis, unspecified, lumbar region: Secondary | ICD-10-CM | POA: Diagnosis not present

## 2021-09-13 DIAGNOSIS — Z7409 Other reduced mobility: Secondary | ICD-10-CM | POA: Diagnosis not present

## 2021-09-13 DIAGNOSIS — M4646 Discitis, unspecified, lumbar region: Secondary | ICD-10-CM | POA: Diagnosis not present

## 2021-09-13 DIAGNOSIS — R531 Weakness: Secondary | ICD-10-CM | POA: Diagnosis not present

## 2021-09-18 DIAGNOSIS — R531 Weakness: Secondary | ICD-10-CM | POA: Diagnosis not present

## 2021-09-18 DIAGNOSIS — Z7409 Other reduced mobility: Secondary | ICD-10-CM | POA: Diagnosis not present

## 2021-09-18 DIAGNOSIS — M4646 Discitis, unspecified, lumbar region: Secondary | ICD-10-CM | POA: Diagnosis not present

## 2021-09-20 DIAGNOSIS — R531 Weakness: Secondary | ICD-10-CM | POA: Diagnosis not present

## 2021-09-20 DIAGNOSIS — Z7409 Other reduced mobility: Secondary | ICD-10-CM | POA: Diagnosis not present

## 2021-09-20 DIAGNOSIS — M4646 Discitis, unspecified, lumbar region: Secondary | ICD-10-CM | POA: Diagnosis not present

## 2021-09-24 DIAGNOSIS — M4646 Discitis, unspecified, lumbar region: Secondary | ICD-10-CM | POA: Diagnosis not present

## 2021-09-24 DIAGNOSIS — M4620 Osteomyelitis of vertebra, site unspecified: Secondary | ICD-10-CM | POA: Diagnosis not present

## 2021-09-24 DIAGNOSIS — M5136 Other intervertebral disc degeneration, lumbar region: Secondary | ICD-10-CM | POA: Diagnosis not present

## 2021-09-24 DIAGNOSIS — M47816 Spondylosis without myelopathy or radiculopathy, lumbar region: Secondary | ICD-10-CM | POA: Diagnosis not present

## 2021-09-25 DIAGNOSIS — R531 Weakness: Secondary | ICD-10-CM | POA: Diagnosis not present

## 2021-09-25 DIAGNOSIS — Z7409 Other reduced mobility: Secondary | ICD-10-CM | POA: Diagnosis not present

## 2021-09-25 DIAGNOSIS — M4646 Discitis, unspecified, lumbar region: Secondary | ICD-10-CM | POA: Diagnosis not present

## 2021-09-28 DIAGNOSIS — M4646 Discitis, unspecified, lumbar region: Secondary | ICD-10-CM | POA: Diagnosis not present

## 2021-09-28 DIAGNOSIS — Z7409 Other reduced mobility: Secondary | ICD-10-CM | POA: Diagnosis not present

## 2021-09-28 DIAGNOSIS — R531 Weakness: Secondary | ICD-10-CM | POA: Diagnosis not present

## 2021-10-01 DIAGNOSIS — R531 Weakness: Secondary | ICD-10-CM | POA: Diagnosis not present

## 2021-10-01 DIAGNOSIS — M4646 Discitis, unspecified, lumbar region: Secondary | ICD-10-CM | POA: Diagnosis not present

## 2021-10-01 DIAGNOSIS — Z7409 Other reduced mobility: Secondary | ICD-10-CM | POA: Diagnosis not present

## 2021-12-26 DIAGNOSIS — M47816 Spondylosis without myelopathy or radiculopathy, lumbar region: Secondary | ICD-10-CM | POA: Diagnosis not present

## 2022-01-15 DIAGNOSIS — Z1211 Encounter for screening for malignant neoplasm of colon: Secondary | ICD-10-CM | POA: Diagnosis not present

## 2022-01-15 DIAGNOSIS — Z86010 Personal history of colonic polyps: Secondary | ICD-10-CM | POA: Diagnosis not present

## 2022-01-15 DIAGNOSIS — K5730 Diverticulosis of large intestine without perforation or abscess without bleeding: Secondary | ICD-10-CM | POA: Diagnosis not present

## 2022-02-18 DIAGNOSIS — E785 Hyperlipidemia, unspecified: Secondary | ICD-10-CM | POA: Diagnosis not present

## 2022-02-18 DIAGNOSIS — D509 Iron deficiency anemia, unspecified: Secondary | ICD-10-CM | POA: Diagnosis not present

## 2022-02-18 DIAGNOSIS — I10 Essential (primary) hypertension: Secondary | ICD-10-CM | POA: Diagnosis not present

## 2022-02-18 DIAGNOSIS — E049 Nontoxic goiter, unspecified: Secondary | ICD-10-CM | POA: Diagnosis not present

## 2022-02-18 DIAGNOSIS — R1030 Lower abdominal pain, unspecified: Secondary | ICD-10-CM | POA: Diagnosis not present

## 2022-02-18 DIAGNOSIS — E039 Hypothyroidism, unspecified: Secondary | ICD-10-CM | POA: Diagnosis not present

## 2022-03-07 DIAGNOSIS — N3090 Cystitis, unspecified without hematuria: Secondary | ICD-10-CM | POA: Diagnosis not present

## 2022-03-25 DIAGNOSIS — M545 Low back pain, unspecified: Secondary | ICD-10-CM | POA: Diagnosis not present

## 2022-03-25 DIAGNOSIS — M4620 Osteomyelitis of vertebra, site unspecified: Secondary | ICD-10-CM | POA: Diagnosis not present

## 2022-03-25 DIAGNOSIS — M5136 Other intervertebral disc degeneration, lumbar region: Secondary | ICD-10-CM | POA: Diagnosis not present

## 2022-03-27 DIAGNOSIS — N3090 Cystitis, unspecified without hematuria: Secondary | ICD-10-CM | POA: Diagnosis not present

## 2022-04-09 DIAGNOSIS — N3020 Other chronic cystitis without hematuria: Secondary | ICD-10-CM | POA: Diagnosis not present

## 2022-04-16 DIAGNOSIS — N3020 Other chronic cystitis without hematuria: Secondary | ICD-10-CM | POA: Diagnosis not present

## 2022-04-17 DIAGNOSIS — E079 Disorder of thyroid, unspecified: Secondary | ICD-10-CM | POA: Diagnosis not present

## 2022-04-17 DIAGNOSIS — S0990XA Unspecified injury of head, initial encounter: Secondary | ICD-10-CM | POA: Diagnosis not present

## 2022-04-17 DIAGNOSIS — S6991XA Unspecified injury of right wrist, hand and finger(s), initial encounter: Secondary | ICD-10-CM | POA: Diagnosis not present

## 2022-04-17 DIAGNOSIS — M25531 Pain in right wrist: Secondary | ICD-10-CM | POA: Diagnosis not present

## 2022-04-17 DIAGNOSIS — R296 Repeated falls: Secondary | ICD-10-CM | POA: Diagnosis not present

## 2022-04-17 DIAGNOSIS — Z743 Need for continuous supervision: Secondary | ICD-10-CM | POA: Diagnosis not present

## 2022-04-17 DIAGNOSIS — Z7982 Long term (current) use of aspirin: Secondary | ICD-10-CM | POA: Diagnosis not present

## 2022-04-17 DIAGNOSIS — R58 Hemorrhage, not elsewhere classified: Secondary | ICD-10-CM | POA: Diagnosis not present

## 2022-04-17 DIAGNOSIS — E785 Hyperlipidemia, unspecified: Secondary | ICD-10-CM | POA: Diagnosis not present

## 2022-04-17 DIAGNOSIS — M7989 Other specified soft tissue disorders: Secondary | ICD-10-CM | POA: Diagnosis not present

## 2022-04-17 DIAGNOSIS — W19XXXA Unspecified fall, initial encounter: Secondary | ICD-10-CM | POA: Diagnosis not present

## 2022-04-17 DIAGNOSIS — I10 Essential (primary) hypertension: Secondary | ICD-10-CM | POA: Diagnosis not present

## 2022-04-17 DIAGNOSIS — M1990 Unspecified osteoarthritis, unspecified site: Secondary | ICD-10-CM | POA: Diagnosis not present

## 2022-04-17 DIAGNOSIS — S0121XA Laceration without foreign body of nose, initial encounter: Secondary | ICD-10-CM | POA: Diagnosis not present

## 2022-04-17 DIAGNOSIS — T148XXA Other injury of unspecified body region, initial encounter: Secondary | ICD-10-CM | POA: Diagnosis not present

## 2022-04-17 DIAGNOSIS — Z880 Allergy status to penicillin: Secondary | ICD-10-CM | POA: Diagnosis not present

## 2022-04-17 DIAGNOSIS — S0993XA Unspecified injury of face, initial encounter: Secondary | ICD-10-CM | POA: Diagnosis not present

## 2022-04-17 DIAGNOSIS — S0083XA Contusion of other part of head, initial encounter: Secondary | ICD-10-CM | POA: Diagnosis not present

## 2022-04-17 DIAGNOSIS — M25562 Pain in left knee: Secondary | ICD-10-CM | POA: Diagnosis not present

## 2022-04-17 DIAGNOSIS — S0181XA Laceration without foreign body of other part of head, initial encounter: Secondary | ICD-10-CM | POA: Diagnosis not present

## 2022-05-22 DIAGNOSIS — R3914 Feeling of incomplete bladder emptying: Secondary | ICD-10-CM | POA: Diagnosis not present

## 2022-05-22 DIAGNOSIS — E049 Nontoxic goiter, unspecified: Secondary | ICD-10-CM | POA: Diagnosis not present

## 2022-05-22 DIAGNOSIS — E785 Hyperlipidemia, unspecified: Secondary | ICD-10-CM | POA: Diagnosis not present

## 2022-05-22 DIAGNOSIS — N3020 Other chronic cystitis without hematuria: Secondary | ICD-10-CM | POA: Diagnosis not present

## 2022-05-22 DIAGNOSIS — D509 Iron deficiency anemia, unspecified: Secondary | ICD-10-CM | POA: Diagnosis not present

## 2022-05-22 DIAGNOSIS — I10 Essential (primary) hypertension: Secondary | ICD-10-CM | POA: Diagnosis not present

## 2022-05-22 DIAGNOSIS — R29818 Other symptoms and signs involving the nervous system: Secondary | ICD-10-CM | POA: Diagnosis not present

## 2022-05-22 DIAGNOSIS — E039 Hypothyroidism, unspecified: Secondary | ICD-10-CM | POA: Diagnosis not present

## 2022-05-23 DIAGNOSIS — R7309 Other abnormal glucose: Secondary | ICD-10-CM | POA: Diagnosis not present

## 2022-05-23 DIAGNOSIS — Z6841 Body Mass Index (BMI) 40.0 and over, adult: Secondary | ICD-10-CM | POA: Diagnosis not present

## 2022-05-23 DIAGNOSIS — R0602 Shortness of breath: Secondary | ICD-10-CM | POA: Diagnosis not present

## 2022-05-23 DIAGNOSIS — Z1331 Encounter for screening for depression: Secondary | ICD-10-CM | POA: Diagnosis not present

## 2022-05-23 DIAGNOSIS — E669 Obesity, unspecified: Secondary | ICD-10-CM | POA: Diagnosis not present

## 2022-05-23 DIAGNOSIS — I10 Essential (primary) hypertension: Secondary | ICD-10-CM | POA: Diagnosis not present

## 2022-05-23 DIAGNOSIS — M170 Bilateral primary osteoarthritis of knee: Secondary | ICD-10-CM | POA: Diagnosis not present

## 2022-05-23 DIAGNOSIS — R5383 Other fatigue: Secondary | ICD-10-CM | POA: Diagnosis not present

## 2022-06-20 DIAGNOSIS — I10 Essential (primary) hypertension: Secondary | ICD-10-CM | POA: Diagnosis not present

## 2022-06-20 DIAGNOSIS — M170 Bilateral primary osteoarthritis of knee: Secondary | ICD-10-CM | POA: Diagnosis not present

## 2022-06-20 DIAGNOSIS — E669 Obesity, unspecified: Secondary | ICD-10-CM | POA: Diagnosis not present

## 2022-06-20 DIAGNOSIS — Z6841 Body Mass Index (BMI) 40.0 and over, adult: Secondary | ICD-10-CM | POA: Diagnosis not present

## 2022-07-16 DIAGNOSIS — E669 Obesity, unspecified: Secondary | ICD-10-CM | POA: Diagnosis not present

## 2022-07-16 DIAGNOSIS — Z6841 Body Mass Index (BMI) 40.0 and over, adult: Secondary | ICD-10-CM | POA: Diagnosis not present

## 2022-07-16 DIAGNOSIS — M170 Bilateral primary osteoarthritis of knee: Secondary | ICD-10-CM | POA: Diagnosis not present

## 2022-07-16 DIAGNOSIS — I10 Essential (primary) hypertension: Secondary | ICD-10-CM | POA: Diagnosis not present

## 2022-08-05 ENCOUNTER — Other Ambulatory Visit: Payer: Self-pay | Admitting: Family Medicine

## 2022-08-05 DIAGNOSIS — Z1231 Encounter for screening mammogram for malignant neoplasm of breast: Secondary | ICD-10-CM

## 2022-09-06 ENCOUNTER — Ambulatory Visit: Payer: Medicare PPO

## 2022-09-06 DIAGNOSIS — N3020 Other chronic cystitis without hematuria: Secondary | ICD-10-CM | POA: Diagnosis not present

## 2022-09-06 DIAGNOSIS — R3914 Feeling of incomplete bladder emptying: Secondary | ICD-10-CM | POA: Diagnosis not present

## 2022-10-11 ENCOUNTER — Ambulatory Visit
Admission: RE | Admit: 2022-10-11 | Discharge: 2022-10-11 | Disposition: A | Discharge status: Home / Self Care | Payer: Medicare PPO | Source: Ambulatory Visit | Attending: Family Medicine | Admitting: Family Medicine

## 2022-10-11 DIAGNOSIS — Z1231 Encounter for screening mammogram for malignant neoplasm of breast: Secondary | ICD-10-CM | POA: Diagnosis not present

## 2022-10-22 DIAGNOSIS — G720 Drug-induced myopathy: Secondary | ICD-10-CM | POA: Diagnosis not present

## 2022-10-22 DIAGNOSIS — I10 Essential (primary) hypertension: Secondary | ICD-10-CM | POA: Diagnosis not present

## 2022-10-22 DIAGNOSIS — E049 Nontoxic goiter, unspecified: Secondary | ICD-10-CM | POA: Diagnosis not present

## 2022-10-22 DIAGNOSIS — Z0000 Encounter for general adult medical examination without abnormal findings: Secondary | ICD-10-CM | POA: Diagnosis not present

## 2022-10-22 DIAGNOSIS — E785 Hyperlipidemia, unspecified: Secondary | ICD-10-CM | POA: Diagnosis not present

## 2022-10-22 DIAGNOSIS — D509 Iron deficiency anemia, unspecified: Secondary | ICD-10-CM | POA: Diagnosis not present

## 2022-10-22 DIAGNOSIS — M170 Bilateral primary osteoarthritis of knee: Secondary | ICD-10-CM | POA: Diagnosis not present

## 2022-10-22 DIAGNOSIS — E039 Hypothyroidism, unspecified: Secondary | ICD-10-CM | POA: Diagnosis not present

## 2022-11-21 IMAGING — CR DG CHEST 2V
2 series · 2 of 2 positions shown · non-contrast
Comparison: None.

CLINICAL DATA: Chest pain.

EXAM:
CHEST - 2 VIEW

[chest lat]
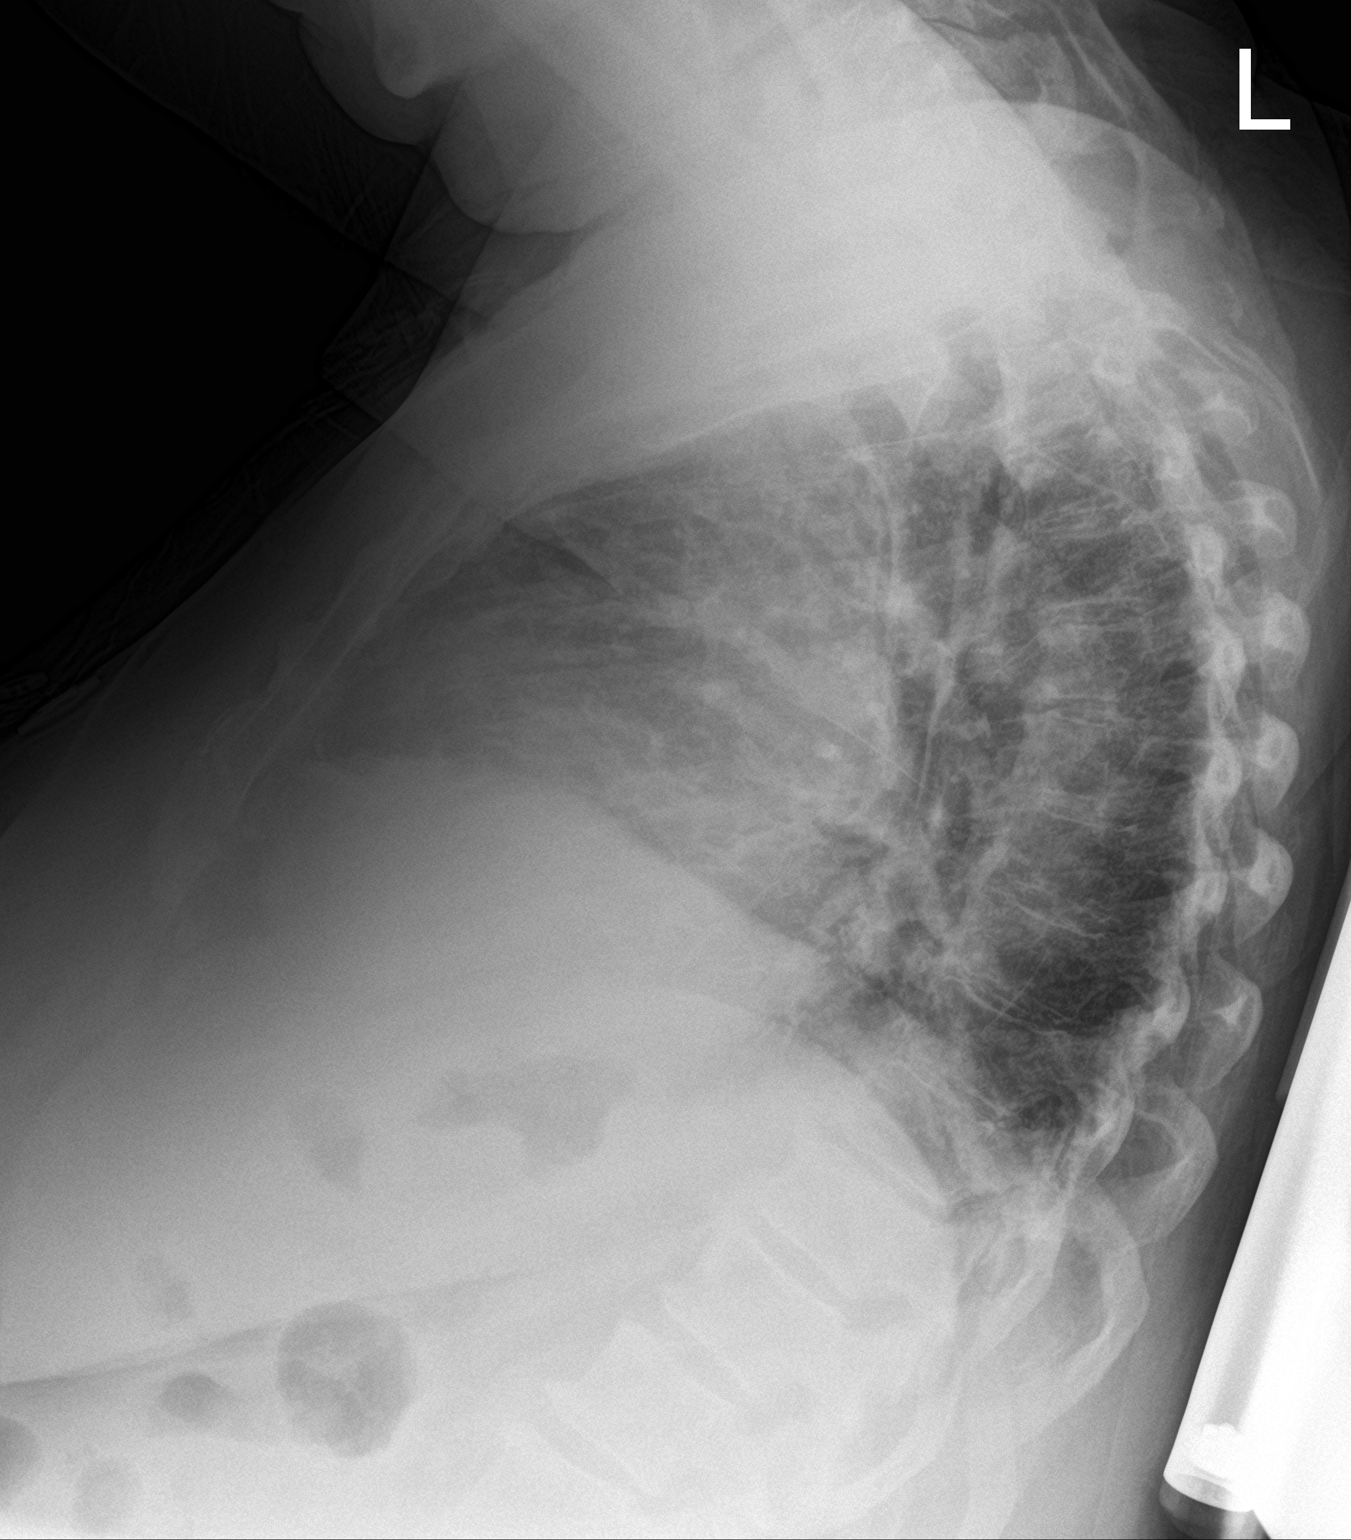

[chest ap]
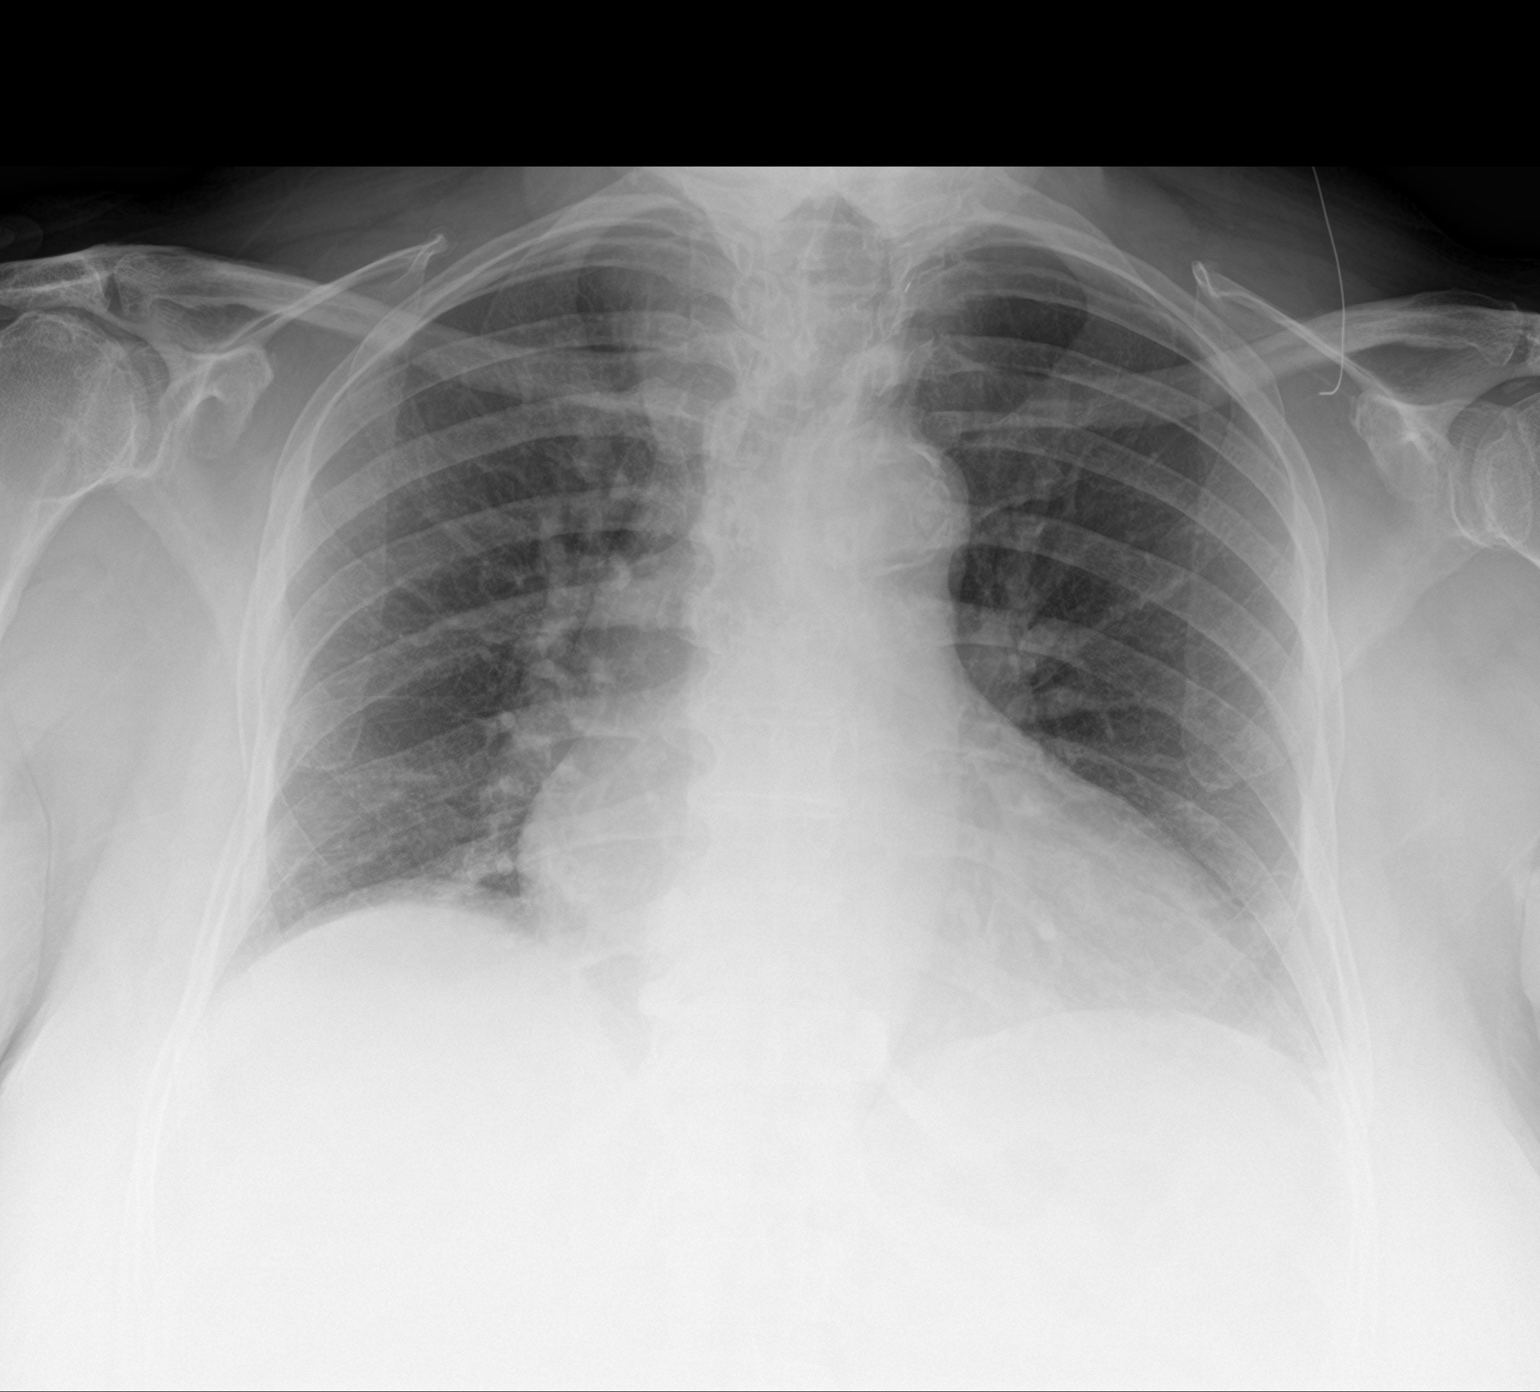

[2 of 2 positions shown; findings below may reference images not displayed]

FINDINGS: The heart size and mediastinal contours are within normal limits.
Both lungs are clear. No pneumothorax or pleural effusion is noted.
The visualized skeletal structures are unremarkable.
IMPRESSION: No active cardiopulmonary disease.

Aortic Atherosclerosis ([id number]).

## 2022-11-23 IMAGING — CR DG CHEST 2V
2 series · 2 of 2 positions shown · non-contrast
Comparison: 12/04/2020

CLINICAL DATA: Short of breath and chest pain

EXAM:
CHEST - 2 VIEW

[chest lat]
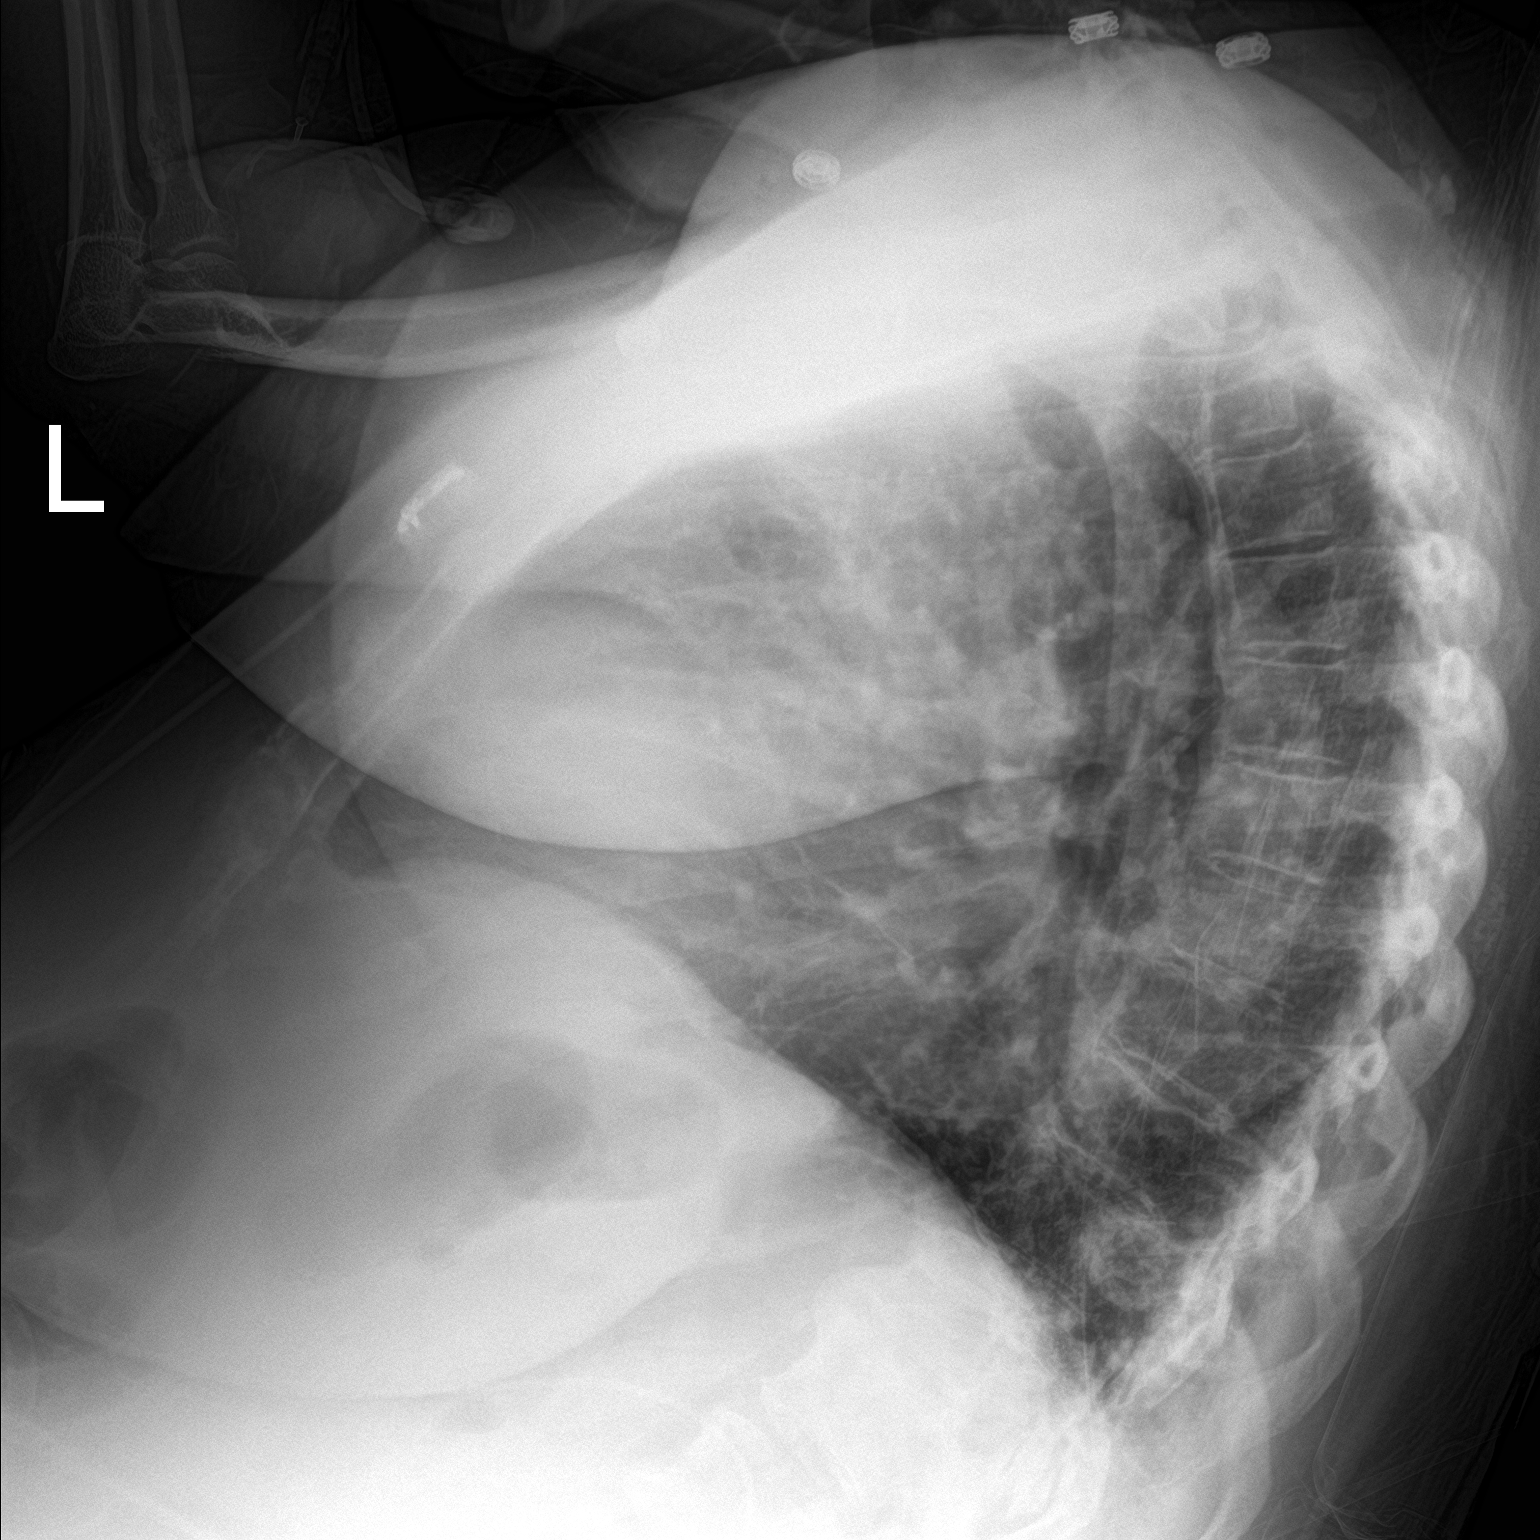

[chest ap]
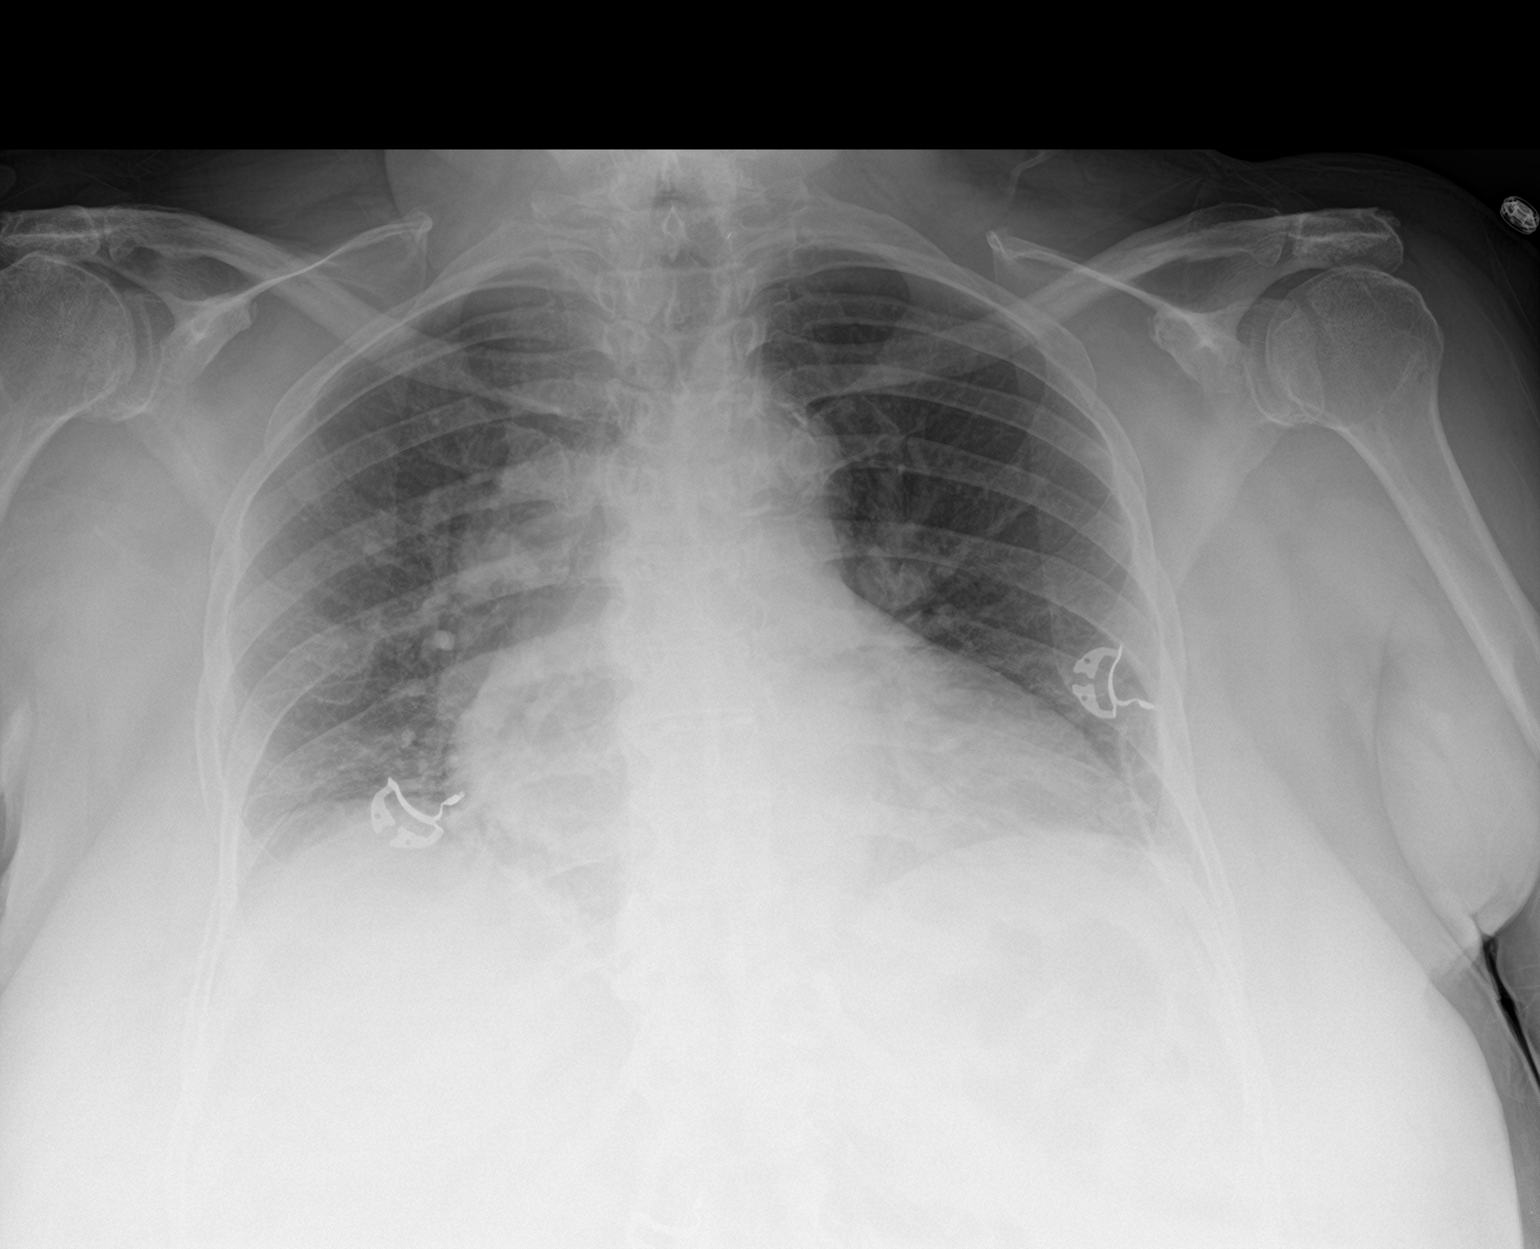

[2 of 2 positions shown; findings below may reference images not displayed]

FINDINGS: Cardiac enlargement.  Negative for heart failure or edema.

Decreased lung volume. Increased bibasilar atelectasis. No
significant effusion.
IMPRESSION: Increased bibasilar atelectasis with hypoventilation.

## 2022-12-04 ENCOUNTER — Ambulatory Visit: Payer: Medicare PPO | Attending: Interventional Cardiology | Admitting: Interventional Cardiology

## 2022-12-04 VITALS — BP 182/102 | HR 75 | Ht 65.0 in | Wt 293.2 lb

## 2022-12-04 DIAGNOSIS — I10 Essential (primary) hypertension: Secondary | ICD-10-CM | POA: Diagnosis not present

## 2022-12-04 DIAGNOSIS — R600 Localized edema: Secondary | ICD-10-CM | POA: Diagnosis not present

## 2022-12-04 DIAGNOSIS — R0602 Shortness of breath: Secondary | ICD-10-CM

## 2022-12-04 DIAGNOSIS — R609 Edema, unspecified: Secondary | ICD-10-CM | POA: Diagnosis not present

## 2022-12-04 NOTE — Progress Notes (Signed)
Cardiology Office Note   Date:  12/04/2022   ID:  Cassidy Austin, DOB 1951/01/10, MRN [id number]  PCP:  Harlan Stains, MD    No chief complaint on file.  LE edema  Wt Readings from Last 3 Encounters:  12/04/22 293 lb 3.2 oz (133 kg)  06/19/21 248 lb (112.5 kg)  05/18/21 249 lb (112.9 kg)       History of Present Illness: Cassidy Austin is a 72 y.o. female who is being seen today for the evaluation of LE edema at the request of Harlan Stains, MD.   Negative ETT about 10 years ago per her report.    First cousin with CAD.  Mother with CAD. Several other first cousins with CABG. Brother with CHF.   Spinal infection in 2022 from UTI: "Left ventricular ejection fraction, by estimation, is 60 to 65%. The  left ventricle has normal function. The left ventricle has no regional  wall motion abnormalities. There is mild concentric left ventricular  hypertrophy. Left ventricular diastolic  parameters are consistent with Grade I diastolic dysfunction (impaired  relaxation). The average left ventricular global longitudinal strain is  -15.8 %. The global longitudinal strain is abnormal.   2. Right ventricular systolic function is normal. The right ventricular  size is normal.   3. The mitral valve is normal in structure. No evidence of mitral valve  regurgitation. No evidence of mitral stenosis.   4. The aortic valve is normal in structure. Aortic valve regurgitation is  not visualized. No aortic stenosis is present.   5. The inferior vena cava is normal in size with greater than 50%  respiratory variability, suggesting right atrial pressure of 3 mmHg. "  Noted LE on occasion .  For several years however in the last month,It has gotten worse and more persistent.  Mostly in both ankles.  R>L.  Had prior to the right ankle injury in the early 90s.    She reports some shortness of breath for the past few years.  DOE noted when she walked and tried to exercise.  Denies : Chest  pain. Dizziness. Nitroglycerin use.  Palpitations. Paroxysmal nocturnal dyspnea. . Syncope.     Past Medical History:  Diagnosis Date   Complication of anesthesia    hard to wake up after eye surgery in 1970s   Edema    Goiter    with thyroid nodule, FNA 63/01 benign follicular nodule   Hypercholesterolemia    Hypertension    Hypothyroidism    Iron deficiency    Keloid scar    OA (osteoarthritis) of knee    mod.   Spondylolisthesis of lumbar region     Past Surgical History:  Procedure Laterality Date   COLONOSCOPY     EYE SURGERY     THYROID SURGERY  1999     Current Outpatient Medications  Medication Sig Dispense Refill   amLODipine (NORVASC) 2.5 MG tablet Take 2.5 mg by mouth daily.     CALCIUM PO Take 1,000 mg by mouth.     carvedilol (COREG) 12.5 MG tablet Take 12.5 mg by mouth 2 (two) times daily.     cholecalciferol (VITAMIN D) 1000 UNITS tablet Take 1,000 Units by mouth 2 (two) times daily.     CRANBERRY PO Take by mouth.     ezetimibe (ZETIA) 10 MG tablet Take 10 mg by mouth at bedtime.     ferrous sulfate 325 (65 FE) MG tablet Take 325 mg by mouth daily with breakfast.  furosemide (LASIX) 40 MG tablet Take 40 mg by mouth.     KLOR-CON M20 20 MEQ tablet Take 20 mEq by mouth daily.     Lactobacillus Acidophilus POWD Take by mouth.     levothyroxine (SYNTHROID, LEVOTHROID) 100 MCG tablet Take 100 mcg by mouth daily at 6 (six) AM.     Magnesium 250 MG TABS Take 500 mg by mouth every morning.     olmesartan-hydrochlorothiazide (BENICAR HCT) 40-25 MG tablet Take 1 tablet by mouth daily.     potassium chloride (KLOR-CON) 10 MEQ tablet Take 20 mEq by mouth daily.     tetrahydrozoline 0.05 % ophthalmic solution Place 1 drop into both eyes daily as needed (dry eyes).     VITAMIN B COMPLEX-C PO Take by mouth.     vitamin C (ASCORBIC ACID) 500 MG tablet Take 500 mg by mouth daily.     aspirin 81 MG chewable tablet Chew 81 mg by mouth every morning. (Patient not taking:  Reported on 12/04/2022)     fluconazole (DIFLUCAN) 150 MG tablet Take 1 tablet (150 mg total) by mouth daily. Can take every 72 hrs upto 3 doses if continues to have symptoms (Patient not taking: Reported on 12/04/2022) 10 tablet 0   tiZANidine (ZANAFLEX) 2 MG tablet Take 1 tablet (2 mg total) by mouth every 8 (eight) hours as needed for muscle spasms. (Patient not taking: Reported on 12/04/2022) 21 tablet 0   No current facility-administered medications for this visit.    Allergies:   Penicillins, Atorvastatin, Influenza vac recombinant ha trivalent [influenza vaccine recombinant], and Rosuvastatin    Social History:  The patient  reports that she has never smoked. She has never used smokeless tobacco. She reports that she does not drink alcohol and does not use drugs.   Family History:  The patient's family history includes Heart failure in her mother.    ROS:  Please see the history of present illness.   Otherwise, review of systems are positive for LE edema.   All other systems are reviewed and negative.    PHYSICAL EXAM: VS:  BP (!) 182/102   Pulse 75   Ht 5\' 5"  (1.651 m)   Wt 293 lb 3.2 oz (133 kg)   SpO2 98%   BMI 48.79 kg/m  , BMI Body mass index is 48.79 kg/m. GEN: Well nourished, well developed, in no acute distress HEENT: normal Neck: no JVD, carotid bruits, or masses Cardiac: RRR; no murmurs, rubs, or gallops,no edema  Respiratory:  clear to auscultation bilaterally, normal work of breathing GI: soft, nontender, nondistended, + BS MS: no deformity or atrophy Skin: warm and dry, no rash Neuro:  Strength and sensation are intact Psych: euthymic mood, full affect   EKG:   The ekg ordered today demonstrates NSR, no ST changes   Recent Labs: No results found for requested labs within last 365 days.   Lipid Panel    Component Value Date/Time   CHOL 171 12/05/2020 0424   TRIG 205 (H) 12/05/2020 0424   HDL 14 (L) 12/05/2020 0424   CHOLHDL 12.2 12/05/2020 0424    VLDL 41 (H) 12/05/2020 0424   LDLCALC 116 (H) 12/05/2020 0424     Other studies Reviewed: Additional studies/ records that were reviewed today with results demonstrating: LDL 151 in Dec 2023.   ASSESSMENT AND PLAN:  DOE: check echo to look for structural heart disease.  No obvious signs of left-sided heart failure on exam.  Lungs are clear.  She  does report feeling better sleeping more upright, but does not report severe shortness of breath if she lies flat.  She likely has some degree of diastolic heart failure.  Will check bmet and BNP. LE edema: improved recently with less salt in the diet and  With compression stockings.  THis makes it more likely that the swelling is related to venous insufficiency.  Elevate legs Morbid obesity: , Eating healthy trying to increase walking to lose weight will also be helpful for several aspects of health.   HTN: The current medical regimen is effective;  continue present plan and medications.  Systolics typically in the 130-140 range.  Family h/o CAD: first cousin with CAD.  Mother with CAD. Several other first cousins with CABG.    Current medicines are reviewed at length with the patient today.  The patient concerns regarding her medicines were addressed.  The following changes have been made:  No change  Labs/ tests ordered today include:  No orders of the defined types were placed in this encounter.   Recommend 150 minutes/week of aerobic exercise Low fat, low carb, high fiber diet recommended  Disposition:   FU based on echo   Signed, Larae Grooms, MD  12/04/2022 4:13 PM    [facility] [address], [place], [place]  [address] Phone: [phone]; Fax: [phone]

## 2022-12-04 NOTE — Patient Instructions (Signed)
Medication Instructions:  Your physician recommends that you continue on your current medications as directed. Please refer to the Current Medication list given to you today.  *If you need a refill on your cardiac medications before your next appointment, please call your pharmacy*   Lab Work: Lab work to be done today--BMP and BNP If you have labs (blood work) drawn today and your tests are completely normal, you will receive your results only by: [place] (if you have MyChart) OR A paper copy in the mail If you have any lab test that is abnormal or we need to change your treatment, we will call you to review the results.   Testing/Procedures: Your physician has requested that you have an echocardiogram. Echocardiography is a painless test that uses sound waves to create images of your heart. It provides your doctor with information about the size and shape of your heart and how well your heart's chambers and valves are working. This procedure takes approximately one hour. There are no restrictions for this procedure. Please do NOT wear cologne, perfume, aftershave, or lotions (deodorant is allowed). Please arrive 15 minutes prior to your appointment time.    Follow-Up: At [facility], you and your health needs are our priority.  As part of our continuing mission to provide you with exceptional heart care, we have created designated Provider Care Teams.  These Care Teams include your primary Cardiologist (physician) and Advanced Practice Providers (APPs -  Physician Assistants and Nurse Practitioners) who all work together to provide you with the care you need, when you need it.  We recommend signing up for the patient portal called "MyChart".  Sign up information is provided on this After Visit Summary.  MyChart is used to connect with patients for Virtual Visits (Telemedicine).  Patients are able to view lab/test results, encounter notes, upcoming appointments, etc.   Non-urgent messages can be sent to your provider as well.   To learn more about what you can do with MyChart, go to [email or web address].    Your next appointment:   Based on results  Provider:   Larae Grooms, MD

## 2022-12-06 LAB — BASIC METABOLIC PANEL
BUN/Creatinine Ratio: 20 (ref 12–28)
BUN: 20 mg/dL (ref 8–27)
CO2: 25 mmol/L (ref 20–29)
Calcium: 9.1 mg/dL (ref 8.7–10.3)
Chloride: 100 mmol/L (ref 96–106)
Creatinine, Ser: 1 mg/dL (ref 0.57–1.00)
Glucose: 95 mg/dL (ref 70–99)
Potassium: 4.1 mmol/L (ref 3.5–5.2)
Sodium: 141 mmol/L (ref 134–144)
eGFR: 59 mL/min/{1.73_m2} — ABNORMAL LOW (ref >59)

## 2022-12-06 LAB — PRO B NATRIURETIC PEPTIDE: NT-Pro BNP: 129 pg/mL (ref 0–301)

## 2022-12-10 DIAGNOSIS — Z79899 Other long term (current) drug therapy: Secondary | ICD-10-CM | POA: Diagnosis not present

## 2022-12-10 DIAGNOSIS — I10 Essential (primary) hypertension: Secondary | ICD-10-CM | POA: Diagnosis not present

## 2022-12-10 DIAGNOSIS — E785 Hyperlipidemia, unspecified: Secondary | ICD-10-CM | POA: Diagnosis not present

## 2022-12-18 DIAGNOSIS — E785 Hyperlipidemia, unspecified: Secondary | ICD-10-CM | POA: Diagnosis not present

## 2022-12-18 DIAGNOSIS — G720 Drug-induced myopathy: Secondary | ICD-10-CM | POA: Diagnosis not present

## 2022-12-18 DIAGNOSIS — Z6841 Body Mass Index (BMI) 40.0 and over, adult: Secondary | ICD-10-CM | POA: Diagnosis not present

## 2022-12-18 DIAGNOSIS — R609 Edema, unspecified: Secondary | ICD-10-CM | POA: Diagnosis not present

## 2022-12-18 DIAGNOSIS — Z9989 Dependence on other enabling machines and devices: Secondary | ICD-10-CM | POA: Diagnosis not present

## 2022-12-18 DIAGNOSIS — I10 Essential (primary) hypertension: Secondary | ICD-10-CM | POA: Diagnosis not present

## 2022-12-31 ENCOUNTER — Ambulatory Visit (HOSPITAL_COMMUNITY): Payer: Medicare PPO | Attending: Internal Medicine

## 2022-12-31 DIAGNOSIS — R0602 Shortness of breath: Secondary | ICD-10-CM | POA: Diagnosis not present

## 2022-12-31 DIAGNOSIS — R600 Localized edema: Secondary | ICD-10-CM

## 2022-12-31 LAB — ECHOCARDIOGRAM COMPLETE
Area-P 1/2: 3.42 cm2
S' Lateral: 2.8 cm

## 2022-12-31 MED ORDER — PERFLUTREN LIPID MICROSPHERE
1.0000 mL | INTRAVENOUS | Status: AC | PRN
  Administered 2022-12-31: 2 mL via INTRAVENOUS

## 2023-01-13 IMAGING — MR MR LUMBAR SPINE W/O CM
5 series · 31 of 48 positions shown · non-contrast
Comparison: Correlation made with CT lumbar spine 12/11/2020

CLINICAL DATA: Lumbar radiculopathy; fall in [REDACTED]

EXAM:
MRI LUMBAR SPINE WITHOUT CONTRAST
TECHNIQUE: Multiplanar, multisequence MR imaging of the lumbar spine was
performed. No intravenous contrast was administered.

[Series 5: T1 · sagittal · 4.0mm · 0.81mm/px · 7 of 17 slices shown (1 of 2)]
[im 1/17]
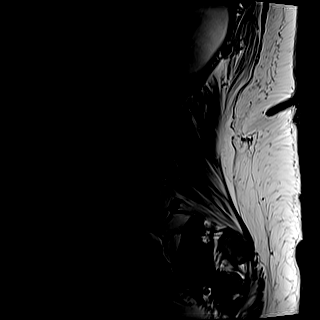
[im 3/17]
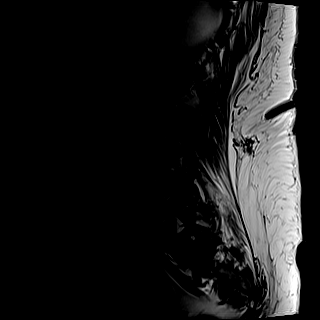
[im 6/17]
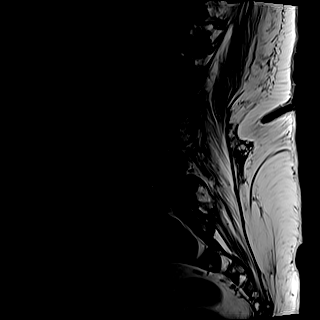
[im 9/17]
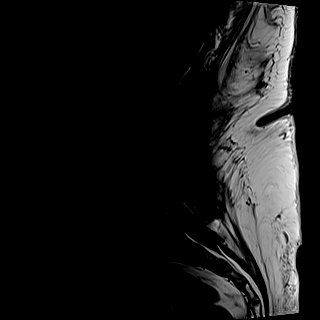
[im 11/17]
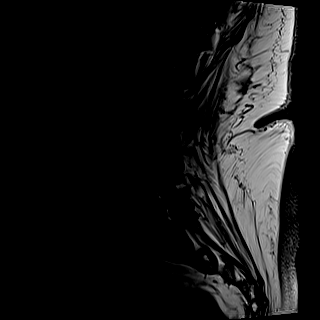
[im 14/17]
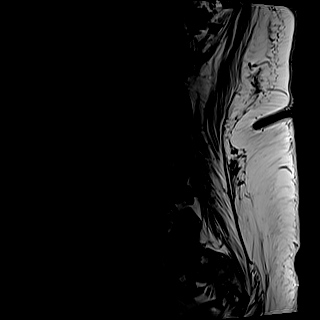
[im 17/17]
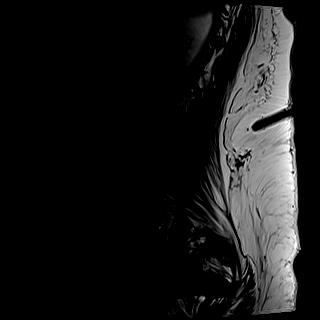

[Series 6: T2 · sagittal · 4.0mm · 0.81mm/px · 7 of 17 slices shown (1 of 2)]
[im 1/17]
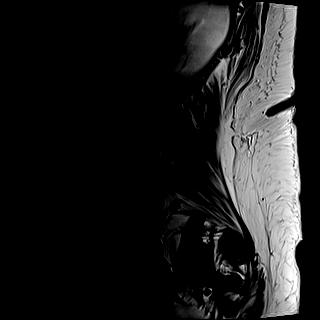
[im 3/17]
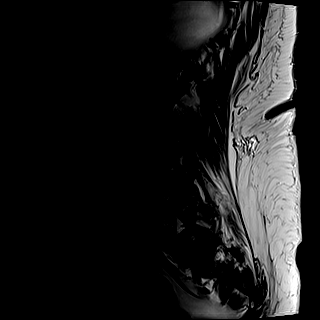
[im 6/17]
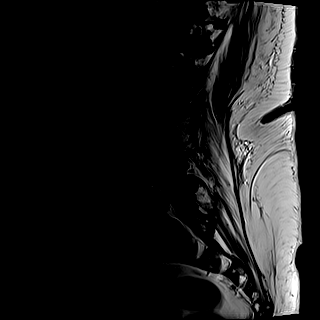
[im 9/17]
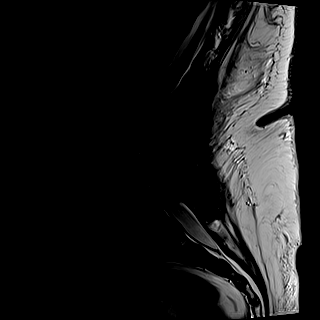
[im 11/17]
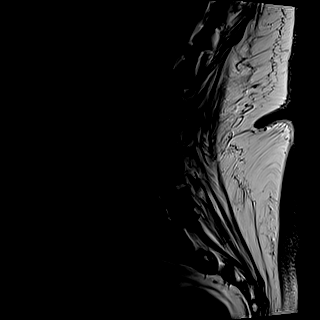
[im 14/17]
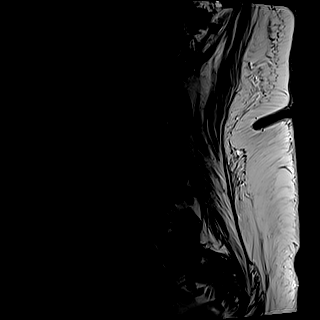
[im 17/17]
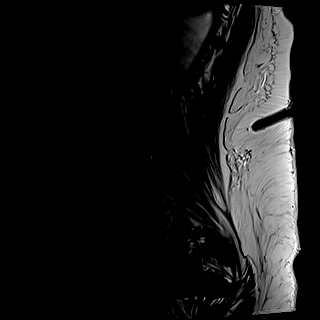

[Series 7: STIR · sagittal · 4.0mm · 0.51mm/px · 1 of 17 slices shown]
[im 1/17]
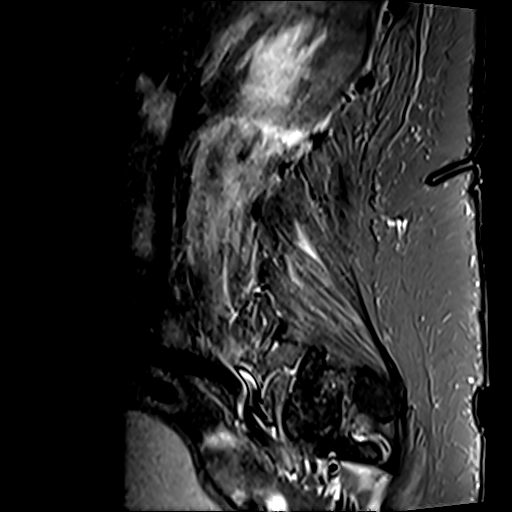

[Series 8: T2 · axial · 4.0mm · 0.62mm/px · z∈[-41,+155]mm · 8 of 38 slices shown (2 of 2)]
[im 1/38]
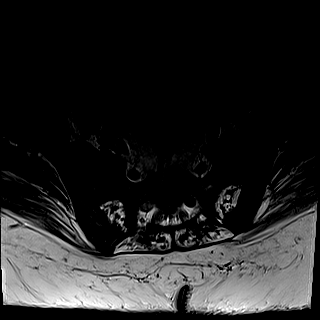
[im 6/38]
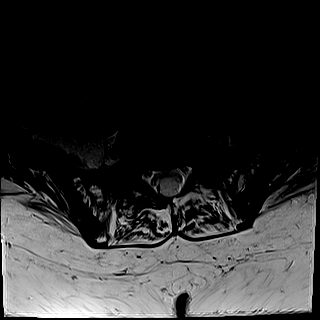
[im 12/38]
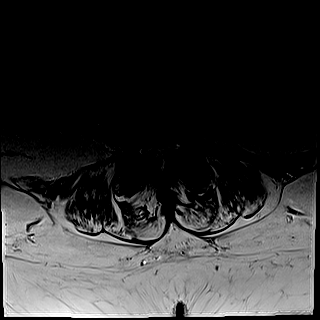
[im 18/38]
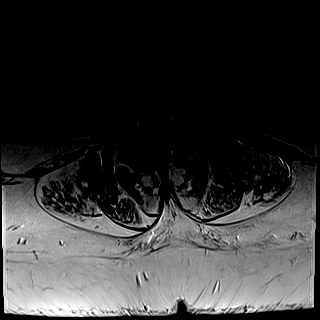
[im 20/38]
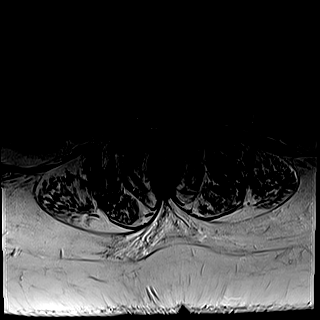
[im 26/38]
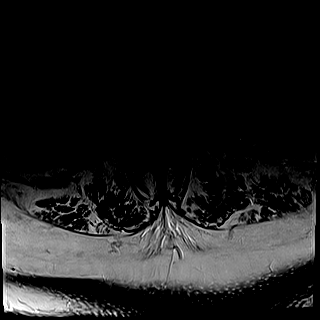
[im 32/38]
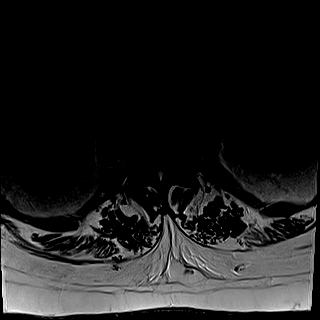
[im 38/38]
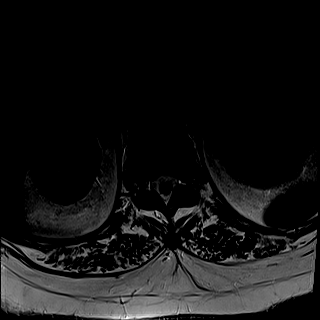

[Series 9: T1 · axial · 4.0mm · 0.39mm/px · z∈[-41,+155]mm · 8 of 38 slices shown (2 of 2)]
[im 1/38]
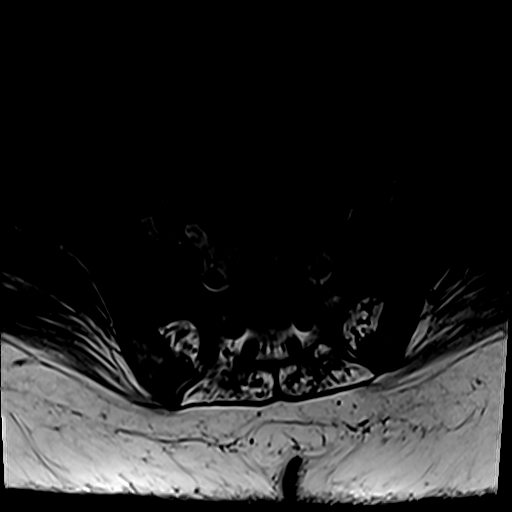
[im 6/38]
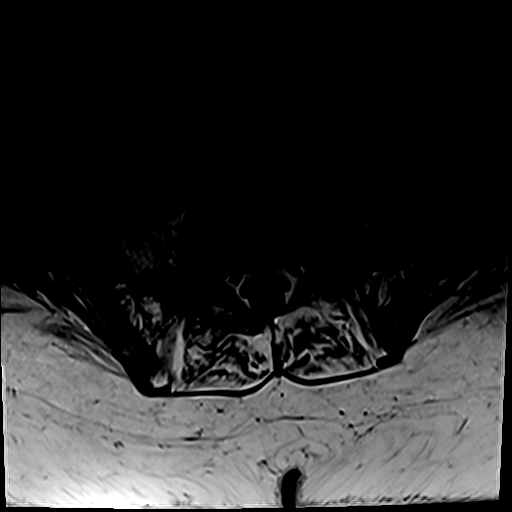
[im 12/38]
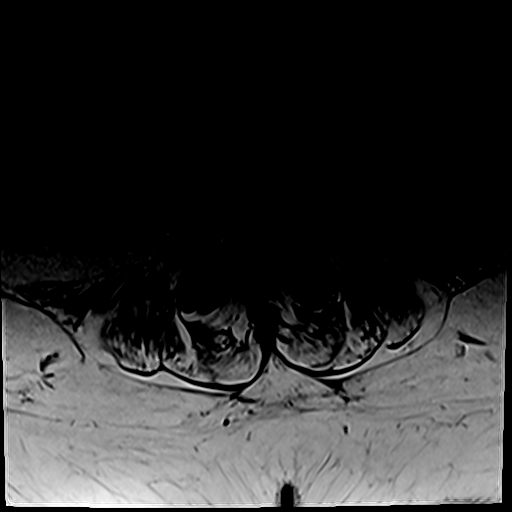
[im 18/38]
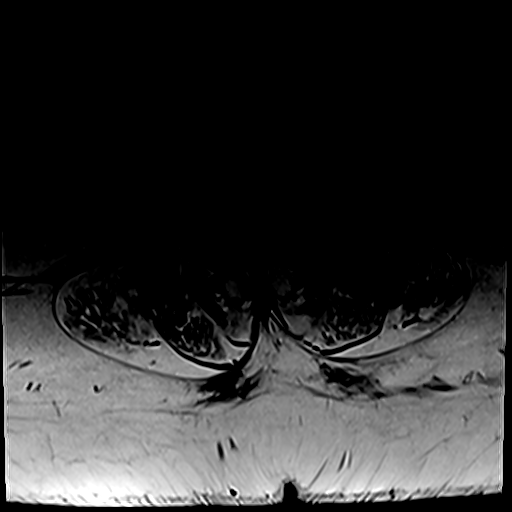
[im 20/38]
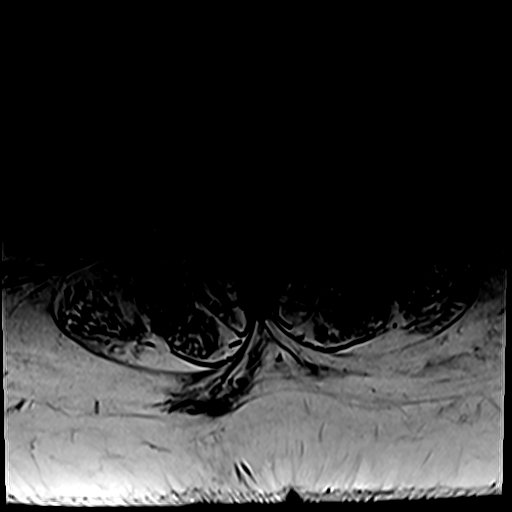
[im 26/38]
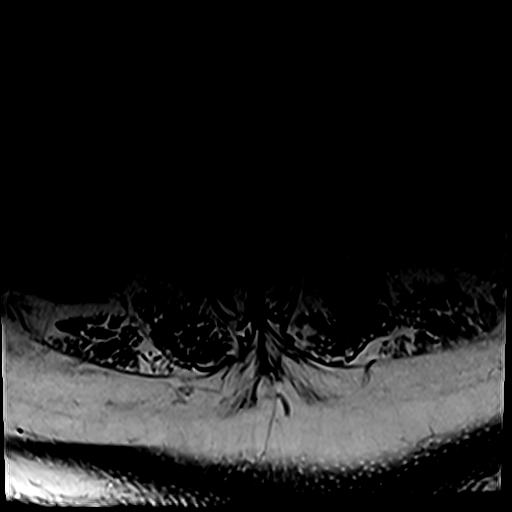
[im 32/38]
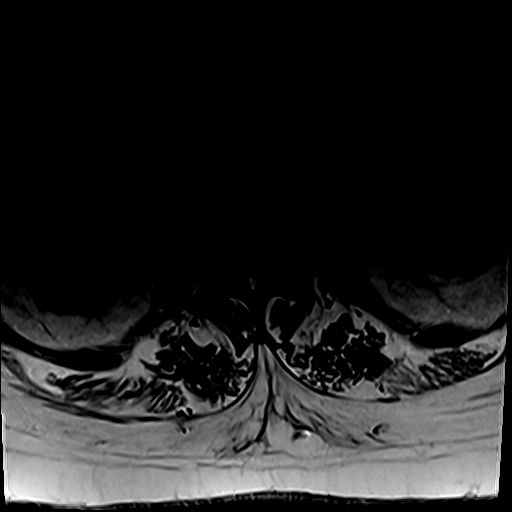
[im 38/38]
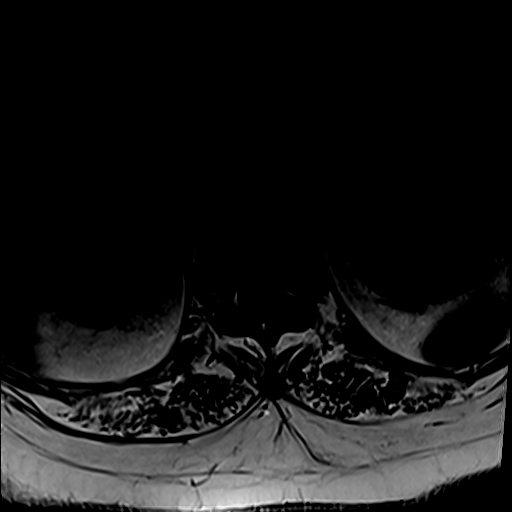

[31 of 48 positions shown; findings below may reference images not displayed]

FINDINGS: Segmentation:  Standard.

Alignment:  Mild anterolisthesis at L3-L4 and L4-L5.

Vertebrae: There is new irregularity and erosion at the L1-L2
opposing endplates with abnormal marrow signal. Vertebral body
heights are otherwise maintained. Background diffusely decreased T1
marrow signal may reflect anemia.

Conus medullaris and cauda equina: Conus extends to the
approximately L1 level. Cauda equina unremarkable apart from level
of stenosis.

Paraspinal and other soft tissues: Paraspinal edema centered at L1
and L2 with involvement of the psoas muscles. Presence of
intradiscal abscess extending into the right psoas is not excluded.

Disc levels:

T12-L1: Mild facet arthropathy.  No stenosis.

L1-L2: T2 hyperintense soft tissue extends from the disc into the
ventral epidural space and slightly below disc level. Moderate facet
arthropathy with ligamentum flavum infolding. Marked canal stenosis.
Moderate to marked foraminal stenosis.

L2-L3: Disc bulge. Moderate facet arthropathy with ligamentum flavum
infolding. Minor canal stenosis. Minor right and mild left foraminal
stenosis.

L3-L4: Anterolisthesis with uncovering of disc bulge. Marked facet
arthropathy with ligamentum flavum infolding. Moderate to marked
canal stenosis. Mild to moderate foraminal stenosis.

L4-L5: Anterolisthesis with uncovering of disc bulge. Marked facet
arthropathy with ligamentum flavum infolding. Moderate canal
stenosis. Mild foraminal stenosis.

L5-S1: Marked right and mild left facet arthropathy. No canal or
right foraminal stenosis. Mild left foraminal stenosis.
IMPRESSION: Evidence of discitis/osteomyelitis at L1-L2 with paraspinal edema.
Intradiscal abscess extending into the right psoas is not excluded.
Ventral epidural extension resulting in marked canal stenosis
together with facet degeneration.

Multilevel degenerative changes at other levels. Degenerative canal
narrowing greatest at L3-L4.

These results will be called to the ordering clinician or
representative by the Radiologist Assistant, and communication
documented in the PACS or [REDACTED].

## 2023-03-11 DIAGNOSIS — N3020 Other chronic cystitis without hematuria: Secondary | ICD-10-CM | POA: Diagnosis not present

## 2023-03-11 DIAGNOSIS — R3914 Feeling of incomplete bladder emptying: Secondary | ICD-10-CM | POA: Diagnosis not present

## 2023-05-22 DIAGNOSIS — R3989 Other symptoms and signs involving the genitourinary system: Secondary | ICD-10-CM | POA: Diagnosis not present

## 2023-05-22 DIAGNOSIS — N390 Urinary tract infection, site not specified: Secondary | ICD-10-CM | POA: Diagnosis not present

## 2023-05-22 DIAGNOSIS — N950 Postmenopausal bleeding: Secondary | ICD-10-CM | POA: Diagnosis not present

## 2023-05-22 DIAGNOSIS — R8271 Bacteriuria: Secondary | ICD-10-CM | POA: Diagnosis not present

## 2023-05-29 DIAGNOSIS — R9389 Abnormal findings on diagnostic imaging of other specified body structures: Secondary | ICD-10-CM | POA: Diagnosis not present

## 2023-05-29 DIAGNOSIS — N9489 Other specified conditions associated with female genital organs and menstrual cycle: Secondary | ICD-10-CM | POA: Diagnosis not present

## 2023-05-29 DIAGNOSIS — N950 Postmenopausal bleeding: Secondary | ICD-10-CM | POA: Diagnosis not present

## 2023-06-30 DIAGNOSIS — R9389 Abnormal findings on diagnostic imaging of other specified body structures: Secondary | ICD-10-CM | POA: Diagnosis not present

## 2023-06-30 DIAGNOSIS — N950 Postmenopausal bleeding: Secondary | ICD-10-CM | POA: Diagnosis not present

## 2023-06-30 DIAGNOSIS — N9489 Other specified conditions associated with female genital organs and menstrual cycle: Secondary | ICD-10-CM | POA: Diagnosis not present

## 2023-08-07 DIAGNOSIS — N3941 Urge incontinence: Secondary | ICD-10-CM | POA: Diagnosis not present

## 2023-08-07 DIAGNOSIS — R3914 Feeling of incomplete bladder emptying: Secondary | ICD-10-CM | POA: Diagnosis not present

## 2023-08-07 DIAGNOSIS — N3020 Other chronic cystitis without hematuria: Secondary | ICD-10-CM | POA: Diagnosis not present

## 2023-08-14 ENCOUNTER — Other Ambulatory Visit: Payer: Self-pay | Admitting: Obstetrics and Gynecology

## 2023-08-14 DIAGNOSIS — N950 Postmenopausal bleeding: Secondary | ICD-10-CM | POA: Diagnosis not present

## 2023-08-14 DIAGNOSIS — N9489 Other specified conditions associated with female genital organs and menstrual cycle: Secondary | ICD-10-CM | POA: Diagnosis not present

## 2023-08-14 DIAGNOSIS — N840 Polyp of corpus uteri: Secondary | ICD-10-CM | POA: Diagnosis not present

## 2023-08-14 DIAGNOSIS — R9389 Abnormal findings on diagnostic imaging of other specified body structures: Secondary | ICD-10-CM | POA: Diagnosis not present

## 2023-08-15 LAB — SURGICAL PATHOLOGY

## 2023-08-20 ENCOUNTER — Other Ambulatory Visit: Payer: Self-pay | Admitting: Family Medicine

## 2023-08-20 DIAGNOSIS — Z1231 Encounter for screening mammogram for malignant neoplasm of breast: Secondary | ICD-10-CM

## 2023-09-09 DIAGNOSIS — R9389 Abnormal findings on diagnostic imaging of other specified body structures: Secondary | ICD-10-CM | POA: Diagnosis not present

## 2023-09-09 DIAGNOSIS — N950 Postmenopausal bleeding: Secondary | ICD-10-CM | POA: Diagnosis not present

## 2023-09-09 DIAGNOSIS — N840 Polyp of corpus uteri: Secondary | ICD-10-CM | POA: Diagnosis not present

## 2023-10-14 ENCOUNTER — Ambulatory Visit: Payer: Medicare PPO

## 2023-11-13 DIAGNOSIS — E785 Hyperlipidemia, unspecified: Secondary | ICD-10-CM | POA: Diagnosis not present

## 2023-11-13 DIAGNOSIS — N950 Postmenopausal bleeding: Secondary | ICD-10-CM | POA: Diagnosis not present

## 2023-11-13 DIAGNOSIS — I10 Essential (primary) hypertension: Secondary | ICD-10-CM | POA: Diagnosis not present

## 2023-11-13 DIAGNOSIS — N390 Urinary tract infection, site not specified: Secondary | ICD-10-CM | POA: Diagnosis not present

## 2023-11-13 DIAGNOSIS — E039 Hypothyroidism, unspecified: Secondary | ICD-10-CM | POA: Diagnosis not present

## 2023-11-13 DIAGNOSIS — M5136 Other intervertebral disc degeneration, lumbar region with discogenic back pain only: Secondary | ICD-10-CM | POA: Diagnosis not present

## 2023-11-13 DIAGNOSIS — M170 Bilateral primary osteoarthritis of knee: Secondary | ICD-10-CM | POA: Diagnosis not present

## 2023-11-13 DIAGNOSIS — D509 Iron deficiency anemia, unspecified: Secondary | ICD-10-CM | POA: Diagnosis not present

## 2023-11-13 DIAGNOSIS — G720 Drug-induced myopathy: Secondary | ICD-10-CM | POA: Diagnosis not present

## 2023-11-13 DIAGNOSIS — Z0000 Encounter for general adult medical examination without abnormal findings: Secondary | ICD-10-CM | POA: Diagnosis not present

## 2023-11-19 ENCOUNTER — Other Ambulatory Visit: Payer: Self-pay | Admitting: Family Medicine

## 2023-11-19 DIAGNOSIS — E2839 Other primary ovarian failure: Secondary | ICD-10-CM

## 2023-11-20 ENCOUNTER — Ambulatory Visit
Admission: RE | Admit: 2023-11-20 | Discharge: 2023-11-20 | Disposition: A | Discharge status: Home / Self Care | Payer: Medicare PPO | Source: Ambulatory Visit | Attending: Family Medicine | Admitting: Family Medicine

## 2023-11-20 DIAGNOSIS — R109 Unspecified abdominal pain: Secondary | ICD-10-CM | POA: Diagnosis not present

## 2023-11-20 DIAGNOSIS — Z1231 Encounter for screening mammogram for malignant neoplasm of breast: Secondary | ICD-10-CM

## 2023-11-20 DIAGNOSIS — D649 Anemia, unspecified: Secondary | ICD-10-CM | POA: Diagnosis not present

## 2023-12-08 ENCOUNTER — Encounter: Payer: Self-pay | Admitting: Internal Medicine

## 2023-12-08 ENCOUNTER — Other Ambulatory Visit: Payer: Self-pay | Admitting: Family Medicine

## 2023-12-08 DIAGNOSIS — R928 Other abnormal and inconclusive findings on diagnostic imaging of breast: Secondary | ICD-10-CM

## 2023-12-08 DIAGNOSIS — Z1231 Encounter for screening mammogram for malignant neoplasm of breast: Secondary | ICD-10-CM

## 2023-12-23 DIAGNOSIS — N289 Disorder of kidney and ureter, unspecified: Secondary | ICD-10-CM | POA: Diagnosis not present

## 2023-12-31 ENCOUNTER — Ambulatory Visit
Admission: RE | Admit: 2023-12-31 | Discharge: 2023-12-31 | Disposition: A | Discharge status: Home / Self Care | Payer: Medicare PPO | Source: Ambulatory Visit | Attending: Family Medicine | Admitting: Family Medicine

## 2023-12-31 ENCOUNTER — Ambulatory Visit: Payer: Medicare PPO

## 2023-12-31 DIAGNOSIS — R928 Other abnormal and inconclusive findings on diagnostic imaging of breast: Secondary | ICD-10-CM | POA: Diagnosis not present

## 2024-01-28 ENCOUNTER — Other Ambulatory Visit: Payer: Self-pay | Admitting: Gastroenterology

## 2024-01-28 DIAGNOSIS — D649 Anemia, unspecified: Secondary | ICD-10-CM | POA: Insufficient documentation

## 2024-01-28 DIAGNOSIS — R109 Unspecified abdominal pain: Secondary | ICD-10-CM | POA: Insufficient documentation

## 2024-02-05 DIAGNOSIS — N840 Polyp of corpus uteri: Secondary | ICD-10-CM | POA: Diagnosis not present

## 2024-02-05 DIAGNOSIS — N950 Postmenopausal bleeding: Secondary | ICD-10-CM | POA: Diagnosis not present

## 2024-02-05 DIAGNOSIS — R1900 Intra-abdominal and pelvic swelling, mass and lump, unspecified site: Secondary | ICD-10-CM | POA: Diagnosis not present

## 2024-02-06 ENCOUNTER — Other Ambulatory Visit: Payer: Self-pay | Admitting: Obstetrics and Gynecology

## 2024-02-06 ENCOUNTER — Encounter: Payer: Self-pay | Admitting: Obstetrics and Gynecology

## 2024-02-06 DIAGNOSIS — R1900 Intra-abdominal and pelvic swelling, mass and lump, unspecified site: Secondary | ICD-10-CM

## 2024-02-16 ENCOUNTER — Ambulatory Visit
Admission: RE | Admit: 2024-02-16 | Discharge: 2024-02-16 | Disposition: A | Discharge status: Home / Self Care | Source: Ambulatory Visit | Attending: Obstetrics and Gynecology | Admitting: Obstetrics and Gynecology

## 2024-02-16 DIAGNOSIS — K429 Umbilical hernia without obstruction or gangrene: Secondary | ICD-10-CM | POA: Diagnosis not present

## 2024-02-16 DIAGNOSIS — K8020 Calculus of gallbladder without cholecystitis without obstruction: Secondary | ICD-10-CM | POA: Diagnosis not present

## 2024-02-16 DIAGNOSIS — N323 Diverticulum of bladder: Secondary | ICD-10-CM | POA: Diagnosis not present

## 2024-02-16 DIAGNOSIS — R1900 Intra-abdominal and pelvic swelling, mass and lump, unspecified site: Secondary | ICD-10-CM

## 2024-02-16 DIAGNOSIS — N261 Atrophy of kidney (terminal): Secondary | ICD-10-CM | POA: Diagnosis not present

## 2024-02-16 MED ORDER — IOPAMIDOL (ISOVUE-300) INJECTION 61%
90.0000 mL | Freq: Once | INTRAVENOUS | Status: AC | PRN
  Administered 2024-02-16: 90 mL via INTRAVENOUS

## 2024-02-23 NOTE — Anesthesia Preprocedure Evaluation (Addendum)
 Anesthesia Evaluation  Patient identified by MRN, date of birth, ID band Patient awake    Reviewed: Allergy & Precautions, NPO status , Patient's Chart, lab work & pertinent test results, reviewed documented beta blocker date and time   History of Anesthesia Complications (+) PROLONGED EMERGENCE and history of anesthetic complications (1970s prolonged emergence)  Airway Mallampati: III  TM Distance: >3 FB Neck ROM: Full    Dental  (+) Teeth Intact, Dental Advisory Given   Pulmonary  Snores at night, has never had sleep study    Pulmonary exam normal breath sounds clear to auscultation       Cardiovascular hypertension (156/52 preop, per pt normally 140s SBP), Pt. on medications and Pt. on home beta blockers Normal cardiovascular exam Rhythm:Regular Rate:Normal     Neuro/Psych negative neurological ROS  negative psych ROS   GI/Hepatic negative GI ROS, Neg liver ROS,,,  Endo/Other  Hypothyroidism  Class 4 obesityBMI 53  Renal/GU negative Renal ROS  negative genitourinary   Musculoskeletal  (+) Arthritis , Osteoarthritis,    Abdominal  (+) + obese  Peds  Hematology negative hematology ROS (+)   Anesthesia Other Findings   Reproductive/Obstetrics negative OB ROS                             Anesthesia Physical Anesthesia Plan  ASA: 3  Anesthesia Plan: General   Post-op Pain Management: Tylenol  PO (pre-op)*   Induction: Intravenous  PONV Risk Score and Plan: 3 and Ondansetron , Treatment may vary due to age or medical condition and Dexamethasone   Airway Management Planned: LMA  Additional Equipment: None  Intra-op Plan:   Post-operative Plan: Extubation in OR  Informed Consent: I have reviewed the patients History and Physical, chart, labs and discussed the procedure including the risks, benefits and alternatives for the proposed anesthesia with the patient or authorized  representative who has indicated his/her understanding and acceptance.     Dental advisory given  Plan Discussed with: CRNA  Anesthesia Plan Comments:        Anesthesia Quick Evaluation

## 2024-02-24 ENCOUNTER — Other Ambulatory Visit: Payer: Self-pay | Admitting: Obstetrics and Gynecology

## 2024-02-24 ENCOUNTER — Other Ambulatory Visit: Payer: Self-pay

## 2024-02-24 ENCOUNTER — Encounter (HOSPITAL_COMMUNITY): Payer: Self-pay | Admitting: Obstetrics and Gynecology

## 2024-02-24 DIAGNOSIS — N950 Postmenopausal bleeding: Secondary | ICD-10-CM

## 2024-02-24 NOTE — Progress Notes (Signed)
 SDW CALL  Patient was given pre-op instructions over the phone. The opportunity was given for the patient to ask questions. No further questions asked. Patient verbalized understanding of instructions given.   PCP - Victorio Grave, MD Cardiologist - Sinclair Due seen 11/2022  PPM/ICD - denies Device Orders -  Rep Notified -   Chest x-ray - na EKG - 12/04/22- will obtain DOS Stress Test - denies ECHO - 2/27//24 Cardiac Cath - denies  Sleep Study - denies CPAP -   Fasting Blood Sugar - na Checks Blood Sugar _____ times a day  Blood Thinner Instructions:na Aspirin  Instructions:na  ERAS Protcol -no PRE-SURGERY Ensure or G2-   COVID TEST- na   Anesthesia review: no  Patient denies shortness of breath, fever, cough and chest pain over the phone call    Oral Hygiene is also important to reduce your risk of infection.  Remember - BRUSH YOUR TEETH THE MORNING OF SURGERY WITH YOUR REGULAR TOOTHPASTE

## 2024-02-24 NOTE — H&P (Deleted)
   The note originally documented on this encounter has been moved the the encounter in which it belongs.

## 2024-02-24 NOTE — H&P (Signed)
 eason for Appointment   1.  Preop vt/ Postmenopausal bleeding/ Endometrial polyp  History of Present Illness    General:  73 y/o presents for pre-op visit. Pt is schedule for hysteroscopy D and C and polypectomy with myosure on 02/25/2024 for the management of PMB and endometrial polyp.   IN REVIEW:  EMB on 08/14/2023 revealed a benign endometrial polyp.  --- From Visit 06/30/2023 s he reports one episode of bleeding  with wiping that occurred early morning in July. This was  single episode of bleeding. She has not had any bleeding since.  she declined hysteroscopy D&C with possible polypectomy and opted for em biopsy to rule out hyperplasia or malignancy instead.  --- Visit on 05/29/2023, patient reports no additional vaginal bleeding. She was advised last visit to hold vaginal estradiol due to recurrent postmenopausal bleeding, as we cannot rule out malignancy. TVUS on 05/29/2023 notable for uterus measuring 6.52 x 3.87 x 4.62 cm. Uterus contains four small fibroids ranging from 1.4 to 2.1 cm, stable compared to previous ultrasound. Endometrium thickened (1.32 cm) with multiple tiny cystic areas and questionable hyperechoic mass with tiny cystic areas measuring 1.6 cm - no blood flow noted. Bilateral ovaries normal, and no adnexal mass seen.  Current Medications Taking Ezetimibe  10 MG Tablet TAKE 1 TABLET BY MOUTH EVERY DAY FOR 90 DAYS amLODIPine Besylate 2.5 MG Tablet TAKE 1 TABLET BY MOUTH EVERY DAY FOR 30 DAYS , Notes to Pharmacist: Fill if due, otherwise put on file Carvedilol  25 MG Tablet Take 1 tablet Orally Twice a day , Notes to Pharmacist: Fill if due, otherwise put on file Ferrous Sulfate 325 (65 Fe) MG Tablet TAKE 1 TABLET BY MOUTH EVERY DAY , Notes to Pharmacist: Fill if due, otherwise put on file Furosemide 40 MG Tablet 1 tablet Orally Once a day, may take a 2nd one if needed for swelling , Notes to Pharmacist: Fill if due, otherwise put on file Olmesartan Medoxomil-HCTZ 40-25 MG  Tablet 1 tablet Orally Dx: I10 Once a day , Notes to Pharmacist: Fill if due, otherwise put on file Potassium Chloride  Crys ER 20 MEQ Tablet Extended Release TAKE 1 TABLET BY MOUTH EVERY DAY , Notes to Pharmacist: Fill if due, otherwise put on file Levothyroxine  Sodium 100 MCG Tablet 1 tablet in the morning on an empty stomach Orally Once a day , Notes to Pharmacist: Fill if due, otherwise put on file MiraLax (Polyethylene Glycol 3350 ) 17 GM/SCOOP Powder 1 scoop mixed with 8 ounces of fluid Orally Once a day As needed Garlique(Garlic) 400 MG Tablet Delayed Release Take 1 tablet by mouth once daily. Coricidin HBP 10-325-2 MG Tablet 2 tablets every 4 hours as needed Orally Five times a day Advil  Dual Action(Ibuprofen -Acetaminophen ) 125-250 MG Tablet 2 tablets as needed Orally once a day Magnesium 250 MG Tablet 2 tablets with a meal Orally Once a day Vitamin B Complex - Tablet Take 1 tablets by mouth once daily. Vitamin C 500 MG Tablet Chewable Take 2 tablets by mouth once daily. Vitamin D3 50 MCG (2000 UT) Capsule 1 capsule Orally once daily Turmeric 500 MG Capsule 2 capsules Orally twice a day Probiotic - Tablet Delayed Release Take 1 capsule by mouth once daily. Cranberry 125 MG Tablet Take 2 capsules by mouth once daily. Salonpas Lidocaine  Plus(Lidocaine  HCl-Benzyl Alcohol) 4-10 % Cream Apply topically as needed for pain. Tums(Calcium Carbonate Antacid) 500 MG Tablet Chewable Chew 2 tablets by mouth daily as needed. Medication List reviewed and reconciled with the patient Past Medical  History Hypothyroidism. Hypertension. adematous polyp--Dr [id number], 3/23, 5 yrs. Hypercholesterolemia. goiter with thyroid  nodule, FNA 12/15--benign follicular nodule. mod OA of knees. Eye--[facility] at [place]. lumbar disciitis, 1/22, Dr Gillian Lacrosse, Dr Nigel Bart. Diverticulosis on colonoscopy 3/23. Fall 6/23, laceration above nose and below nose. Kidney stones. recurrent UTI, Dr Dulcy Gibney. cardiologist, Dr  Jacquelynn Matter, 2019, follows yearly. lumbar DDD, Arch Ko, NP. grade 1 diastolic dysfunction on ECHO 2/24. Iron deficiency anemia. Surgical History Left partial thyroidectomy - goiter 00 tonsilectomy eye surgery x 2 - lazy eye D&C and polypectomy 2013 hysteroscopy 2013 lumbar disciits, then in rehab til 6/22 3/22 Family History Father: deceased 88 yrs, prostate cancer, diagnosed with Prostate CA, Hypertension, Diabetes Mother: deceased 71 yrs, hyperlipidemia, diagnosed with Hypertension, Coronary artery disease, Diabetes Paternal Grand Father: deceased, died of asthma in his 70s/80s Paternal Grand Mother: deceased Maternal Grand Father: deceased Maternal Grand Mother: deceased, diagnosed with Coronary artery disease Brother 1: alive 47 yrs, hypertension, diabetes, prostate cancer, diagnosed with Prostate CA, Hypertension, Diabetes Brother2: alive 20 yrs, prostate Cancer Sister 1: deceased 59 yrs, hypertension, breast cancer @ 40, hyperlipidemia, died of CHF, diagnosed with Hypertension, Breast cancer 2 brother(s) , 1 sister(s) . No Family History of Colon Cancer, Polyps, or Liver Disease . Social History    General:  Tobacco use  cigarettes:  Never smoked, Tobacco history last updated  02/05/2024, Vaping  No. EXPOSURE TO PASSIVE SMOKE: no. Alcohol: no. Caffeine: yes, coffee, tea. Recreational drug use: no, never. Exercise: peddler daily. Marital Status: Divorced, lives in 1 story house (lives with nephew with recovering from hospitalization, since 10/22). Children: none. 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 Religion: [employer] [facility] [employer] [employer] Gyn History Sexual activity not currently sexually active.  Periods : postmenopausal.  LMP spotting on 05/20/2023.  Denies H/O Birth control.  Last pap smear date 02/2016.  Last mammogram date 12/31/2023.  Abnormal pap smear yes.  Menarche 10.  OB History Number of pregnancies  1.  Pregnancy # 1  miscarriage.  Allergies Lipitor:  muscle aches - Side Effects Influenza Vac Recombinant HA: severe reaction in 1970s--skin pealed - Allergy Rosuvastatin: muslce aches - Side Effects Penicillin: rash/hives - Side Effects Bactrim DS: angioedema, diarrhea - Allergy - Criticality High Hospitalization/Major Diagnostic Procedure see surgery list none in the last year 08/2019 sepsis due to E Coli UTI, acute kidney injury, nephrolithisis, demand ischemia 2/22 none within the past yr 02/2024 Review of Systems    CONSTITUTIONAL:  Chills No.  Fatigue , YES.  Fever No.  Night sweats No.  Recent travel outside [place]  No.  Sweats No.  Weight change No.     OPHTHALMOLOGY:  Blurring of vision no.  Change in vision no.  Double vision no.     ENT:  Dizziness no.  Nose bleeds no.  Sore throat no.  Teeth pain no.     ALLERGY:  Hives no.     CARDIOLOGY:  Chest pain no.  High blood pressure , YES.  Irregular heart beat no.  Leg edema yes.  Palpitations no.     RESPIRATORY:  Shortness of breath no.  Cough no.  Wheezing no.     UROLOGY:  Pain with urination no.  Urinary urgency , yes.  Urinary frequency yes.  Urinary incontinence no.  Difficulty urinating No.  Blood in urine No.     GASTROENTEROLOGY:  Abdominal pain no.  Appetite change no.  Bloating/belching no.  Blood in stool or on toilet paper no.  Change in bowel movements no.  Constipation no.  Diarrhea no.  Difficulty swallowing no.  Nausea no.     FEMALE REPRODUCTIVE:  Vulvar pain no.  Vulvar rash no.  Abnormal vaginal bleeding postmenopausal bleeding .  Breast pain no.  Nipple discharge no.  Pain with intercourse no.  Pelvic pain no.  Unusual vaginal discharge no.  Vaginal itching no.     MUSCULOSKELETAL:  Muscle aches no.     NEUROLOGY:  Headache no.  Tingling/numbness no.  Weakness no.     PSYCHOLOGY:  Depression no.  Anxiety no.  Nervousness no.  Sleep disturbances no.  Suicidal ideation no .     ENDOCRINOLOGY:  Excessive thirst no.  Excessive urination no.  Hair loss no.  Heat or  cold intolerance no.     HEMATOLOGY/LYMPH:  Abnormal bleeding no.  Easy bruising no.  Swollen glands no.     DERMATOLOGY:  New/changing skin lesion no.  Rash no.  Sores no.  Vital Signs Wt: 310.8, Wt change: 8.8 lbs, Ht: 64, BMI: 53.34, Pulse sitting: 82, BP sitting: 147/70. Examination    General Examination: CONSTITUTIONAL:  alert, oriented, NAD.  SKIN:  moist, warm.  EYES:  Conjunctiva clear.  LUNGS:  good I:E efffort noted, clear to auscultation bilaterally.  HEART:  regular rate and rhythm.  ABDOMEN:  soft, non-tender/non-distended, bowel sounds present.  FEMALE GENITOURINARY:  normal external genitalia, labia - unremarkable, vagina - pink moist mucosa, no lesions or abnormal discharge, cervix - no discharge or lesions or CMT, adnexa - no masses or tenderness, uterus - nontender and normal size on palpation.  EXTREMITIES:  edema bilaterally .  PSYCH:  affect normal, good eye contact.  Physical Examination    Chaperone present:  Chaperone present  [place] Butter 02/05/2024 03:02:15 PM > late entry, for pelvic exam.  Assessments 1. Postmenopausal bleeding - N95.0 (Primary)    2. Endometrial polyp - N84.0    3. Abdominal mass - R19.00    Specify : supraumbilical     Treatment 1. Postmenopausal bleeding        Notes: Planning hysteroscopy D/C possible polypectomy with myosure. Pt is advised she will be able to return home the same day if she is doing well. Discussed risks of hysteroscopy including but not limited to infection, bleeding, possible perforation of the uterus, with the need for further surgery. Pt advised to avoid NSAIDs (Aspirin , Aleve, Advil , Ibuprofen , Motrin ) from now until surgery given risk of bleeding during surgery. She may take Tylenol  for pain management. She is advised to avoid eating or drinking starting midnight prior to surgery. Discussed post-surgery avoidance of driving for 24 hours or intercourse for 2 weeks after procedure.   2. Endometrial polyp         Notes: Planning hysteroscopy D/C possible polypectomy with myosure. Pt is advised she will be able to return home the same day if she is doing well. Discussed risks of hysteroscopy including but not limited to infection, bleeding, possible perforation of the uterus, with the need for further surgery. Pt advised to avoid NSAIDs (Aspirin , Aleve, Advil , Ibuprofen , Motrin ) from now until surgery given risk of bleeding during surgery. She may take Tylenol  for pain management. She is advised to avoid eating or drinking starting midnight prior to surgery. Discussed post-surgery avoidance of driving for 24 hours or intercourse for 2 weeks after procedure.   3. Abdominal mass        Notes: pt states she noticed this mass in January . she has protrusion of her umbilicus that started January 2025  Referral ZO:[id number]               Reason:please order a ct scan of the abdomen with contrast to evaluate abdominal mass and assess for hernia

## 2024-02-25 ENCOUNTER — Ambulatory Visit (HOSPITAL_BASED_OUTPATIENT_CLINIC_OR_DEPARTMENT_OTHER): Payer: Self-pay | Admitting: Anesthesiology

## 2024-02-25 ENCOUNTER — Encounter (HOSPITAL_COMMUNITY): Payer: Self-pay | Admitting: Obstetrics and Gynecology

## 2024-02-25 ENCOUNTER — Encounter (HOSPITAL_COMMUNITY)
Admission: RE | Disposition: A | Discharge status: Home / Self Care | Payer: Self-pay | Source: Home / Self Care | Attending: Obstetrics and Gynecology

## 2024-02-25 ENCOUNTER — Other Ambulatory Visit: Payer: Self-pay

## 2024-02-25 ENCOUNTER — Ambulatory Visit (HOSPITAL_COMMUNITY)
Admission: RE | Admit: 2024-02-25 | Discharge: 2024-02-25 | Disposition: A | Discharge status: Home / Self Care | Payer: Medicare PPO | Attending: Obstetrics and Gynecology | Admitting: Obstetrics and Gynecology

## 2024-02-25 ENCOUNTER — Ambulatory Visit (HOSPITAL_COMMUNITY): Payer: Self-pay | Admitting: Anesthesiology

## 2024-02-25 DIAGNOSIS — E6689 Other obesity not elsewhere classified: Secondary | ICD-10-CM | POA: Insufficient documentation

## 2024-02-25 DIAGNOSIS — Z6841 Body Mass Index (BMI) 40.0 and over, adult: Secondary | ICD-10-CM | POA: Diagnosis not present

## 2024-02-25 DIAGNOSIS — E039 Hypothyroidism, unspecified: Secondary | ICD-10-CM | POA: Insufficient documentation

## 2024-02-25 DIAGNOSIS — I10 Essential (primary) hypertension: Secondary | ICD-10-CM | POA: Insufficient documentation

## 2024-02-25 DIAGNOSIS — M1990 Unspecified osteoarthritis, unspecified site: Secondary | ICD-10-CM | POA: Insufficient documentation

## 2024-02-25 DIAGNOSIS — R0683 Snoring: Secondary | ICD-10-CM | POA: Insufficient documentation

## 2024-02-25 DIAGNOSIS — Z79899 Other long term (current) drug therapy: Secondary | ICD-10-CM | POA: Diagnosis not present

## 2024-02-25 DIAGNOSIS — E7800 Pure hypercholesterolemia, unspecified: Secondary | ICD-10-CM | POA: Diagnosis not present

## 2024-02-25 DIAGNOSIS — R9389 Abnormal findings on diagnostic imaging of other specified body structures: Secondary | ICD-10-CM | POA: Insufficient documentation

## 2024-02-25 DIAGNOSIS — N950 Postmenopausal bleeding: Secondary | ICD-10-CM | POA: Diagnosis present

## 2024-02-25 DIAGNOSIS — N840 Polyp of corpus uteri: Secondary | ICD-10-CM

## 2024-02-25 HISTORY — PX: DILATATION & CURRETTAGE/HYSTEROSCOPY WITH RESECTOCOPE: SHX5572

## 2024-02-25 HISTORY — PX: MYOSURE RESECTION: SHX7611

## 2024-02-25 LAB — POCT I-STAT, CHEM 8
BUN: 31 mg/dL — ABNORMAL HIGH (ref 8–23)
Calcium, Ion: 1.07 mmol/L — ABNORMAL LOW (ref 1.15–1.40)
Chloride: 101 mmol/L (ref 98–111)
Creatinine, Ser: 1.4 mg/dL — ABNORMAL HIGH (ref 0.44–1.00)
Glucose, Bld: 105 mg/dL — ABNORMAL HIGH (ref 70–99)
HCT: 34 % — ABNORMAL LOW (ref 36.0–46.0)
Hemoglobin: 11.6 g/dL — ABNORMAL LOW (ref 12.0–15.0)
Potassium: 3.4 mmol/L — ABNORMAL LOW (ref 3.5–5.1)
Sodium: 140 mmol/L (ref 135–145)
TCO2: 27 mmol/L (ref 22–32)

## 2024-02-25 LAB — TYPE AND SCREEN
ABO/RH(D): O POS
Antibody Screen: NEGATIVE

## 2024-02-25 LAB — BASIC METABOLIC PANEL WITH GFR
Anion gap: 15 (ref 5–15)
BUN: 28 mg/dL — ABNORMAL HIGH (ref 8–23)
CO2: 24 mmol/L (ref 22–32)
Calcium: 8.7 mg/dL — ABNORMAL LOW (ref 8.9–10.3)
Chloride: 99 mmol/L (ref 98–111)
Creatinine, Ser: 1.18 mg/dL — ABNORMAL HIGH (ref 0.44–1.00)
GFR, Estimated: 48 mL/min — ABNORMAL LOW (ref >60)
Glucose, Bld: 102 mg/dL — ABNORMAL HIGH (ref 70–99)
Potassium: 3.4 mmol/L — ABNORMAL LOW (ref 3.5–5.1)
Sodium: 138 mmol/L (ref 135–145)

## 2024-02-25 LAB — CBC
HCT: 33.1 % — ABNORMAL LOW (ref 36.0–46.0)
Hemoglobin: 10.6 g/dL — ABNORMAL LOW (ref 12.0–15.0)
MCH: 28 pg (ref 26.0–34.0)
MCHC: 32 g/dL (ref 30.0–36.0)
MCV: 87.3 fL (ref 80.0–100.0)
Platelets: 223 10*3/uL (ref 150–400)
RBC: 3.79 MIL/uL — ABNORMAL LOW (ref 3.87–5.11)
RDW: 16.1 % — ABNORMAL HIGH (ref 11.5–15.5)
WBC: 6.9 10*3/uL (ref 4.0–10.5)
nRBC: 0 % (ref 0.0–0.2)

## 2024-02-25 LAB — ABO/RH: ABO/RH(D): O POS

## 2024-02-25 SURGERY — DILATATION & CURETTAGE/HYSTEROSCOPY WITH RESECTOCOPE
Anesthesia: General | Site: Uterus

## 2024-02-25 MED ORDER — CHLORHEXIDINE GLUCONATE 0.12 % MT SOLN
OROMUCOSAL | Status: AC
Start: 2024-02-25 — End: 2024-02-25
  Administered 2024-02-25: 15 mL via OROMUCOSAL
  Filled 2024-02-25: qty 15

## 2024-02-25 MED ORDER — OXYCODONE HCL 5 MG PO TABS: 5.0000 mg | ORAL_TABLET | Freq: Once | ORAL | Status: DC | PRN

## 2024-02-25 MED ORDER — POVIDONE-IODINE 10 % EX SWAB
2.0000 | Freq: Once | CUTANEOUS | Status: AC
  Administered 2024-02-25: 2 via TOPICAL

## 2024-02-25 MED ORDER — LACTATED RINGERS IV SOLN: INTRAVENOUS | Status: DC

## 2024-02-25 MED ORDER — PROPOFOL 10 MG/ML IV BOLUS
INTRAVENOUS | Status: DC | PRN
  Administered 2024-02-25: 50 mg via INTRAVENOUS
  Administered 2024-02-25: 200 mg via INTRAVENOUS

## 2024-02-25 MED ORDER — FENTANYL CITRATE (PF) 250 MCG/5ML IJ SOLN
INTRAMUSCULAR | Status: DC | PRN
Start: 2024-02-25 — End: 2024-02-25
  Administered 2024-02-25: 100 ug via INTRAVENOUS
  Administered 2024-02-25: 50 ug via INTRAVENOUS

## 2024-02-25 MED ORDER — BUPIVACAINE HCL (PF) 0.25 % IJ SOLN
INTRAMUSCULAR | Status: DC | PRN
  Administered 2024-02-25: 20 mL

## 2024-02-25 MED ORDER — OXYCODONE HCL 5 MG/5ML PO SOLN: 5.0000 mg | Freq: Once | ORAL | Status: DC | PRN

## 2024-02-25 MED ORDER — AMISULPRIDE (ANTIEMETIC) 5 MG/2ML IV SOLN: 10.0000 mg | Freq: Once | INTRAVENOUS | Status: DC | PRN

## 2024-02-25 MED ORDER — ACETAMINOPHEN 500 MG PO TABS: 1000.0000 mg | ORAL_TABLET | Freq: Once | ORAL | Status: DC

## 2024-02-25 MED ORDER — PROPOFOL 10 MG/ML IV BOLUS
INTRAVENOUS | Status: AC
  Filled 2024-02-25: qty 20

## 2024-02-25 MED ORDER — PHENYLEPHRINE 80 MCG/ML (10ML) SYRINGE FOR IV PUSH (FOR BLOOD PRESSURE SUPPORT)
PREFILLED_SYRINGE | INTRAVENOUS | Status: DC | PRN
  Administered 2024-02-25: 80 ug via INTRAVENOUS
  Administered 2024-02-25: 160 ug via INTRAVENOUS

## 2024-02-25 MED ORDER — SILVER NITRATE-POT NITRATE 75-25 % EX MISC
CUTANEOUS | Status: DC | PRN
  Administered 2024-02-25: 2 via TOPICAL

## 2024-02-25 MED ORDER — ACETAMINOPHEN 500 MG PO TABS
ORAL_TABLET | ORAL | Status: AC
  Filled 2024-02-25: qty 2

## 2024-02-25 MED ORDER — OXYCODONE HCL 5 MG PO TABS: 5.0000 mg | ORAL_TABLET | Freq: Four times a day (QID) | ORAL | 0 refills | Status: DC | PRN

## 2024-02-25 MED ORDER — ONDANSETRON HCL 4 MG/2ML IJ SOLN
INTRAMUSCULAR | Status: AC
  Filled 2024-02-25: qty 2

## 2024-02-25 MED ORDER — DEXMEDETOMIDINE HCL IN NACL 80 MCG/20ML IV SOLN
INTRAVENOUS | Status: AC
  Filled 2024-02-25: qty 20

## 2024-02-25 MED ORDER — ACETAMINOPHEN 500 MG PO TABS: 1000.0000 mg | ORAL_TABLET | ORAL | Status: DC

## 2024-02-25 MED ORDER — IBUPROFEN 600 MG PO TABS: 600.0000 mg | ORAL_TABLET | Freq: Four times a day (QID) | ORAL | 0 refills | Status: AC | PRN

## 2024-02-25 MED ORDER — SODIUM CHLORIDE 0.9 % IR SOLN
Status: DC | PRN
  Administered 2024-02-25: 3000 mL

## 2024-02-25 MED ORDER — ONDANSETRON HCL 4 MG/2ML IJ SOLN
INTRAMUSCULAR | Status: DC | PRN
Start: 2024-02-25 — End: 2024-02-25
  Administered 2024-02-25: 4 mg via INTRAVENOUS

## 2024-02-25 MED ORDER — ONDANSETRON HCL 4 MG/2ML IJ SOLN: 4.0000 mg | Freq: Once | INTRAMUSCULAR | Status: DC | PRN

## 2024-02-25 MED ORDER — CHLORHEXIDINE GLUCONATE 0.12 % MT SOLN: 15.0000 mL | Freq: Once | OROMUCOSAL | Status: AC

## 2024-02-25 MED ORDER — DEXAMETHASONE SODIUM PHOSPHATE 10 MG/ML IJ SOLN
INTRAMUSCULAR | Status: AC
  Filled 2024-02-25: qty 1

## 2024-02-25 MED ORDER — LIDOCAINE 2% (20 MG/ML) 5 ML SYRINGE
INTRAMUSCULAR | Status: DC | PRN
  Administered 2024-02-25: 60 mg via INTRAVENOUS

## 2024-02-25 MED ORDER — HYDROMORPHONE HCL 1 MG/ML IJ SOLN: 0.2500 mg | INTRAMUSCULAR | Status: DC | PRN

## 2024-02-25 MED ORDER — FENTANYL CITRATE (PF) 250 MCG/5ML IJ SOLN
INTRAMUSCULAR | Status: AC
  Filled 2024-02-25: qty 5

## 2024-02-25 MED ORDER — ORAL CARE MOUTH RINSE: 15.0000 mL | Freq: Once | OROMUCOSAL | Status: AC

## 2024-02-25 MED ORDER — DEXAMETHASONE SODIUM PHOSPHATE 10 MG/ML IJ SOLN
INTRAMUSCULAR | Status: DC | PRN
  Administered 2024-02-25: 10 mg via INTRAVENOUS

## 2024-02-25 SURGICAL SUPPLY — 16 items
CANISTER SUCT 3000ML PPV (MISCELLANEOUS) ×1 IMPLANT
CATH ROBINSON RED A/P 16FR (CATHETERS) ×2 IMPLANT
DEVICE MYOSURE LITE (MISCELLANEOUS) IMPLANT
DEVICE MYOSURE REACH (MISCELLANEOUS) IMPLANT
DILATOR CANAL MILEX (MISCELLANEOUS) IMPLANT
GLOVE BIOGEL PI IND STRL 6.5 (GLOVE) ×1 IMPLANT
GLOVE SURG ENC TEXT LTX SZ6.5 (GLOVE) ×2 IMPLANT
GLOVE SURG UNDER POLY LF SZ7 (GLOVE) ×1 IMPLANT
GOWN STRL REUS W/ TWL LRG LVL3 (GOWN DISPOSABLE) ×2 IMPLANT
KIT PROCEDURE FLUENT (KITS) ×1 IMPLANT
KIT TURNOVER KIT B (KITS) ×1 IMPLANT
PACK VAGINAL MINOR WOMEN LF (CUSTOM PROCEDURE TRAY) ×1 IMPLANT
PAD OB MATERNITY 11 LF (PERSONAL CARE ITEMS) ×1 IMPLANT
SEAL ROD LENS SCOPE MYOSURE (ABLATOR) ×2 IMPLANT
TOWEL GREEN STERILE FF (TOWEL DISPOSABLE) ×2 IMPLANT
UNDERPAD 30X36 HEAVY ABSORB (UNDERPADS AND DIAPERS) ×1 IMPLANT

## 2024-02-25 NOTE — Anesthesia Procedure Notes (Signed)
 Procedure Name: LMA Insertion Date/Time: 02/25/2024 12:34 PM  Performed by: Jamas Maywood, CRNAPre-anesthesia Checklist: Patient identified, Emergency Drugs available, Suction available and Patient being monitored Patient Re-evaluated:Patient Re-evaluated prior to induction Oxygen Delivery Method: Circle system utilized Preoxygenation: Pre-oxygenation with 100% oxygen Induction Type: IV induction Ventilation: Mask ventilation without difficulty LMA: LMA inserted LMA Size: 4.0 Tube type: Oral Number of attempts: 1 Placement Confirmation: positive ETCO2 and breath sounds checked- equal and bilateral Tube secured with: Tape Dental Injury: Teeth and Oropharynx as per pre-operative assessment

## 2024-02-25 NOTE — Op Note (Signed)
 02/25/2024  1:06 PM  PATIENT:  Cassidy Austin  73 y.o. female  PRE-OPERATIVE DIAGNOSIS:  Endometrial polyp, Endometrial thickening on ultrasound, Postmenopausal bleeding  POST-OPERATIVE DIAGNOSIS:  Endometrial polyp, Endometrial thickening on ultrasound, Postmenopausal bleeding  PROCEDURE:  Procedure(s): DILATATION & CURETTAGE/HYSTEROSCOPY WITH RESECTOCOPE (N/A) MYOSURE RESECTION (N/A)  SURGEON:  Surgeons and Role:    Arlee Lace, MD - Primary  PHYSICIAN ASSISTANT:   ASSISTANTS: none   ANESTHESIA:   general  EBL:  5 mL   BLOOD ADMINISTERED:none  DRAINS: none   LOCAL MEDICATIONS USED:  MARCAINE      SPECIMEN:  Source of Specimen:  Endometrial polyps and curettings   DISPOSITION OF SPECIMEN:  PATHOLOGY  COUNTS:  YES  TOURNIQUET:  * No tourniquets in log *  DICTATION: .Note written in EPIC  PLAN OF CARE: Discharge to home after PACU  PATIENT DISPOSITION:  PACU - hemodynamically stable.   Delay start of Pharmacological VTE agent (>24hrs) due to surgical blood loss or risk of bleeding: not applicable  Findings: normal external genitalia, vaginal mucosa and cervix. Multiple endometrial polyps.   Procedure: Patient was taken to the operating room [facility] at [facility]  where she was placed under general anesthesia. She was placed in the dorsal lithotomy position. She was prepped and draped in the usual sterile fashion. A speculum was placed into the vaginal vault. The anterior lip of the cervix was grasped with a single-tooth tenaculum. Quarter percent Marcaine  was injected at the 4 and 8:00 positions of the cervix. The uterus  was then sounded to 7 cm. The cervix was dilated to approximately 6 mm. Mysosure operative  hysteroscope was inserted. The findings noted above. Myosure reach  blade was introduced throught the hysteroscope. The endometrial polyps and curettings were obtained  within 5 minutes.  There was no evidence of perforation. Hysteroscope was then  removed. The single-tooth tenaculum was removed from the anterior lip of the cervix. Patient was noted to have bleeding from the tenaculum site. Silver  nitrate was applied and excellent hemostasis was noted. The speculum was removed from the patient's vagina. She was awakened from anesthesia taken care  To the recovery  room awake and in stable condition. Sponge lap and needle counts were correct x 2.   Saline deficit was 120  cc.

## 2024-02-25 NOTE — Discharge Instructions (Addendum)

## 2024-02-25 NOTE — Transfer of Care (Signed)
 Immediate Anesthesia Transfer of Care Note  Patient: Cassidy Austin  Procedure(s) Performed: DILATATION & CURETTAGE/HYSTEROSCOPY WITH RESECTOCOPE (Uterus) MYOSURE RESECTION (Uterus)  Patient Location: PACU  Anesthesia Type:General  Level of Consciousness: drowsy and patient cooperative  Airway & Oxygen Therapy: Patient Spontanous Breathing and Patient connected to face mask oxygen  Post-op Assessment: Report given to RN and Post -op Vital signs reviewed and stable  Post vital signs: Reviewed and stable  Last Vitals:  Vitals Value Taken Time  BP 150/88 02/25/24 1316  Temp    Pulse 54 02/25/24 1319  Resp 18 02/25/24 1319  SpO2 100 % 02/25/24 1319  Vitals shown include unfiled device data.  Last Pain:  Vitals:   02/25/24 1109  TempSrc: Oral  PainSc: 7          Complications: No notable events documented.

## 2024-02-25 NOTE — H&P (Signed)
 Date of Initial H&P: 02/24/2024  History reviewed, patient examined, no change in status, stable for surgery.

## 2024-02-26 ENCOUNTER — Encounter (HOSPITAL_COMMUNITY): Payer: Self-pay | Admitting: Obstetrics and Gynecology

## 2024-02-26 LAB — SURGICAL PATHOLOGY

## 2024-02-26 NOTE — Anesthesia Postprocedure Evaluation (Signed)
 Anesthesia Post Note  Patient: Cassidy Austin  Procedure(s) Performed: DILATATION & CURETTAGE/HYSTEROSCOPY WITH RESECTOCOPE (Uterus) MYOSURE RESECTION (Uterus)     Patient location during evaluation: PACU Anesthesia Type: General Level of consciousness: awake and alert Pain management: pain level controlled Vital Signs Assessment: post-procedure vital signs reviewed and stable Respiratory status: spontaneous breathing, nonlabored ventilation, respiratory function stable and patient connected to nasal cannula oxygen Cardiovascular status: blood pressure returned to baseline and stable Postop Assessment: no apparent nausea or vomiting Anesthetic complications: no   No notable events documented.  Last Vitals:  Vitals:   02/25/24 1345 02/25/24 1427  BP: (!) 165/62 (!) 162/69  Pulse: (!) 58 62  Resp: 17 18  Temp:  36.4 C  SpO2: 94% 97%    Last Pain:  Vitals:   02/25/24 1315  TempSrc:   PainSc: 0-No pain                 Leslye Rast

## 2024-03-10 ENCOUNTER — Encounter (HOSPITAL_COMMUNITY): Payer: Self-pay | Admitting: Gastroenterology

## 2024-03-10 DIAGNOSIS — Z9889 Other specified postprocedural states: Secondary | ICD-10-CM | POA: Diagnosis not present

## 2024-03-10 NOTE — Progress Notes (Signed)
 Attempted to obtain medical history for pre op call via telephone, unable to reach at this time. HIPAA compliant voicemail message left requesting return call to pre surgical testing department.

## 2024-03-16 ENCOUNTER — Encounter (HOSPITAL_COMMUNITY): Payer: Self-pay | Admitting: Gastroenterology

## 2024-03-16 ENCOUNTER — Encounter (HOSPITAL_COMMUNITY)
Admission: RE | Disposition: A | Discharge status: Home / Self Care | Payer: Self-pay | Source: Home / Self Care | Attending: Gastroenterology

## 2024-03-16 ENCOUNTER — Ambulatory Visit (HOSPITAL_COMMUNITY)
Admission: RE | Admit: 2024-03-16 | Discharge: 2024-03-16 | Disposition: A | Discharge status: Home / Self Care | Attending: Gastroenterology | Admitting: Gastroenterology

## 2024-03-16 ENCOUNTER — Ambulatory Visit (HOSPITAL_COMMUNITY): Admitting: Anesthesiology

## 2024-03-16 ENCOUNTER — Other Ambulatory Visit: Payer: Self-pay

## 2024-03-16 DIAGNOSIS — Z79899 Other long term (current) drug therapy: Secondary | ICD-10-CM | POA: Diagnosis not present

## 2024-03-16 DIAGNOSIS — K3189 Other diseases of stomach and duodenum: Secondary | ICD-10-CM | POA: Diagnosis not present

## 2024-03-16 DIAGNOSIS — D649 Anemia, unspecified: Secondary | ICD-10-CM | POA: Insufficient documentation

## 2024-03-16 DIAGNOSIS — R109 Unspecified abdominal pain: Secondary | ICD-10-CM | POA: Insufficient documentation

## 2024-03-16 DIAGNOSIS — E039 Hypothyroidism, unspecified: Secondary | ICD-10-CM | POA: Insufficient documentation

## 2024-03-16 DIAGNOSIS — I10 Essential (primary) hypertension: Secondary | ICD-10-CM | POA: Insufficient documentation

## 2024-03-16 DIAGNOSIS — D500 Iron deficiency anemia secondary to blood loss (chronic): Secondary | ICD-10-CM | POA: Diagnosis not present

## 2024-03-16 DIAGNOSIS — E6689 Other obesity not elsewhere classified: Secondary | ICD-10-CM | POA: Insufficient documentation

## 2024-03-16 DIAGNOSIS — D509 Iron deficiency anemia, unspecified: Secondary | ICD-10-CM | POA: Diagnosis not present

## 2024-03-16 DIAGNOSIS — Z8249 Family history of ischemic heart disease and other diseases of the circulatory system: Secondary | ICD-10-CM | POA: Diagnosis not present

## 2024-03-16 DIAGNOSIS — Z6841 Body Mass Index (BMI) 40.0 and over, adult: Secondary | ICD-10-CM | POA: Diagnosis not present

## 2024-03-16 DIAGNOSIS — M1990 Unspecified osteoarthritis, unspecified site: Secondary | ICD-10-CM | POA: Diagnosis not present

## 2024-03-16 DIAGNOSIS — K319 Disease of stomach and duodenum, unspecified: Secondary | ICD-10-CM | POA: Insufficient documentation

## 2024-03-16 DIAGNOSIS — K259 Gastric ulcer, unspecified as acute or chronic, without hemorrhage or perforation: Secondary | ICD-10-CM | POA: Diagnosis not present

## 2024-03-16 DIAGNOSIS — K317 Polyp of stomach and duodenum: Secondary | ICD-10-CM | POA: Diagnosis not present

## 2024-03-16 HISTORY — PX: BIOPSY OF SKIN SUBCUTANEOUS TISSUE AND/OR MUCOUS MEMBRANE: SHX6741

## 2024-03-16 HISTORY — PX: ESOPHAGOGASTRODUODENOSCOPY: SHX5428

## 2024-03-16 SURGERY — EGD (ESOPHAGOGASTRODUODENOSCOPY)
Anesthesia: Monitor Anesthesia Care | Laterality: Bilateral

## 2024-03-16 MED ORDER — SODIUM CHLORIDE 0.9 % IV SOLN
INTRAVENOUS | Status: AC | PRN
  Administered 2024-03-16: 500 mL via INTRAMUSCULAR

## 2024-03-16 MED ORDER — LIDOCAINE 2% (20 MG/ML) 5 ML SYRINGE
INTRAMUSCULAR | Status: DC | PRN
  Administered 2024-03-16: 60 mg via INTRAVENOUS

## 2024-03-16 MED ORDER — PROPOFOL 10 MG/ML IV BOLUS
INTRAVENOUS | Status: DC | PRN
  Administered 2024-03-16: 50 mg via INTRAVENOUS

## 2024-03-16 MED ORDER — PROPOFOL 1000 MG/100ML IV EMUL
INTRAVENOUS | Status: AC
  Filled 2024-03-16: qty 100

## 2024-03-16 MED ORDER — PROPOFOL 500 MG/50ML IV EMUL
INTRAVENOUS | Status: DC | PRN
  Administered 2024-03-16: 140 ug/kg/min via INTRAVENOUS

## 2024-03-16 MED ORDER — SODIUM CHLORIDE 0.9 % IV SOLN: INTRAVENOUS | Status: DC | PRN

## 2024-03-16 MED ORDER — PHENYLEPHRINE 80 MCG/ML (10ML) SYRINGE FOR IV PUSH (FOR BLOOD PRESSURE SUPPORT)
PREFILLED_SYRINGE | INTRAVENOUS | Status: DC | PRN
  Administered 2024-03-16: 160 ug via INTRAVENOUS

## 2024-03-16 MED ORDER — SODIUM CHLORIDE 0.9 % IV SOLN
INTRAVENOUS | Status: DC
Start: 2024-03-16 — End: 2024-03-16

## 2024-03-16 NOTE — Anesthesia Preprocedure Evaluation (Signed)
 Anesthesia Evaluation  Patient identified by MRN, date of birth, ID band Patient awake    Reviewed: Allergy & Precautions, H&P , NPO status , Patient's Chart, lab work & pertinent test results  Airway Mallampati: II   Neck ROM: full    Dental   Pulmonary neg pulmonary ROS   breath sounds clear to auscultation       Cardiovascular hypertension,  Rhythm:regular Rate:Normal     Neuro/Psych    GI/Hepatic   Endo/Other  Hypothyroidism  Class 4 obesity  Renal/GU      Musculoskeletal  (+) Arthritis ,    Abdominal   Peds  Hematology  (+) Blood dyscrasia, anemia   Anesthesia Other Findings   Reproductive/Obstetrics                             Anesthesia Physical Anesthesia Plan  ASA: 3  Anesthesia Plan: MAC   Post-op Pain Management:    Induction: Intravenous  PONV Risk Score and Plan: 2 and Propofol  infusion and Treatment may vary due to age or medical condition  Airway Management Planned: Nasal Cannula  Additional Equipment:   Intra-op Plan:   Post-operative Plan:   Informed Consent: I have reviewed the patients History and Physical, chart, labs and discussed the procedure including the risks, benefits and alternatives for the proposed anesthesia with the patient or authorized representative who has indicated his/her understanding and acceptance.     Dental advisory given  Plan Discussed with: CRNA, Anesthesiologist and Surgeon  Anesthesia Plan Comments:        Anesthesia Quick Evaluation

## 2024-03-16 NOTE — Discharge Instructions (Signed)
YOU HAD AN ENDOSCOPIC PROCEDURE TODAY: Refer to the procedure report and other information in the discharge instructions given to you for any specific questions about what was found during the examination. If this information does not answer your questions, please call the [facility] office at [phone] to clarify.   YOU SHOULD EXPECT: Some feelings of bloating in the abdomen. Passage of more gas than usual. Walking can help get rid of the air that was put into your GI tract during the procedure and reduce the bloating.  DIET: Your first meal following the procedure should be a light meal and then it is ok to progress to your normal diet. A half-sandwich or bowl of soup is an example of a good first meal. Heavy or fried foods are harder to digest and may make you feel nauseous or bloated. Drink plenty of fluids but you should avoid alcoholic beverages for 24 hours.   ACTIVITY: Your care partner should take you home directly after the procedure. You should plan to take it easy, moving slowly for the rest of the day. You can resume normal activity the day after the procedure however YOU SHOULD NOT DRIVE, use power tools, machinery or perform tasks that involve climbing or major physical exertion for 24 hours (because of the sedation medicines used during the test).   SYMPTOMS TO REPORT IMMEDIATELY: A gastroenterologist can be reached at any hour. Please call [phone]  for any of the following symptoms:   Following upper endoscopy (EGD, EUS, ERCP, esophageal dilation) Vomiting of blood or coffee ground material  New, significant abdominal pain  New, significant chest pain or pain under the shoulder blades  Painful or persistently difficult swallowing  New shortness of breath  Black, tarry-looking or red, bloody stools  FOLLOW UP:  If any biopsies were taken you will be contacted by phone or by letter within the next 1-3 weeks. Call [phone]  if you have not heard about the biopsies in 3  weeks.  Please also call with any specific questions about appointments or follow up tests. 

## 2024-03-16 NOTE — Transfer of Care (Signed)
 Immediate Anesthesia Transfer of Care Note  Patient: [name]  Procedure(s) Performed: EGD (ESOPHAGOGASTRODUODENOSCOPY) (Bilateral) BIOPSY, SKIN, SUBCUTANEOUS TISSUE, OR MUCOUS MEMBRANE  Patient Location: PACU  Anesthesia Type:MAC  Level of Consciousness: awake and alert   Airway & Oxygen Therapy: Patient Spontanous Breathing and Patient connected to face mask oxygen  Post-op Assessment: Report given to RN and Post -op Vital signs reviewed and stable  Post vital signs: Reviewed and stable  Last Vitals:  Vitals Value Taken Time  BP 128/35 03/16/24 0944  Temp    Pulse 60 03/16/24 0945  Resp 24 03/16/24 0945  SpO2 100 % 03/16/24 0945  Vitals shown include unfiled device data.  Last Pain:  Vitals:   03/16/24 0944  TempSrc: Temporal  PainSc:          Complications: No notable events documented.

## 2024-03-16 NOTE — H&P (Signed)
 [facility] H/P Note  Chief Complaint: anemia  HPI: Cassidy Austin is an 73 y.o. female.  Anemia without GI bleeding.  Colonoscopy 2023, while also anemic, unrevealing.  No abdominal pain or overt bleeding.  Past Medical History:  Diagnosis Date   Complication of anesthesia    hard to wake up after eye surgery in 1970s   Edema    Goiter    with thyroid  nodule, FNA 12/15 benign follicular nodule   Hypercholesterolemia    Hypertension    Hypothyroidism    Iron deficiency    Keloid scar    OA (osteoarthritis) of knee    mod.   Osteomyelitis of lumbar spine (HCC) 2022   Spondylolisthesis of lumbar region     Past Surgical History:  Procedure Laterality Date   COLONOSCOPY     DILATATION & CURRETTAGE/HYSTEROSCOPY WITH RESECTOCOPE N/A 02/25/2024   Procedure: DILATATION & CURETTAGE/HYSTEROSCOPY WITH RESECTOCOPE;  Surgeon: Arlee Lace, MD;  Location: [facility] OR;  Service: Gynecology;  Laterality: N/A;   EYE SURGERY     MYOSURE RESECTION N/A 02/25/2024   Procedure: Almeda Jacobs RESECTION;  Surgeon: Arlee Lace, MD;  Location: [facility] OR;  Service: Gynecology;  Laterality: N/A;   THYROID  SURGERY  11/04/1997   TONSILLECTOMY      Medications Prior to Admission  Medication Sig Dispense Refill   acetaminophen  (TYLENOL ) 650 MG CR tablet Take 1,300 mg by mouth 2 (two) times daily.     amLODipine (NORVASC) 2.5 MG tablet Take 2.5 mg by mouth daily.     b complex vitamins capsule Take 1 capsule by mouth daily.     calcium carbonate (TUMS - DOSED IN MG ELEMENTAL CALCIUM) 500 MG chewable tablet Chew 1,000 mg by mouth daily as needed for indigestion or heartburn.     CALCIUM PO Take 2 tablets by mouth daily.     carvedilol  (COREG ) 25 MG tablet Take 25 mg by mouth 2 (two) times daily with a meal.     cholecalciferol  (VITAMIN D ) 1000 UNITS tablet Take 1,000 Units by mouth 2 (two) times daily.     CRANBERRY PO Take 250 mg by mouth 2 (two) times daily.     DM-APAP-CPM (CORICIDIN HBP PO) Take 2 tablets  by mouth daily as needed (congestion).     ezetimibe  (ZETIA ) 10 MG tablet Take 10 mg by mouth at bedtime.     ferrous sulfate 325 (65 FE) MG tablet Take 325 mg by mouth daily with breakfast.     furosemide (LASIX) 40 MG tablet Take 40 mg by mouth daily. May take a second 40 mg dose as needed for swelling     GARLIC PO Take 1 capsule by mouth daily.     KLOR-CON  M20 20 MEQ tablet Take 20 mEq by mouth daily.     levothyroxine  (SYNTHROID , LEVOTHROID) 100 MCG tablet Take 100 mcg by mouth daily at 6 (six) AM.     Magnesium 250 MG TABS Take 500 mg by mouth every morning.     Naphazoline HCl (CLEAR EYES OP) Place 1 drop into both eyes daily.     olmesartan-hydrochlorothiazide  (BENICAR HCT) 40-25 MG tablet Take 1 tablet by mouth daily.     polyethylene glycol (MIRALAX  / GLYCOLAX ) 17 g packet Take 17 g by mouth daily as needed for moderate constipation.     Probiotic Product (PROBIOTIC PO) Take 1 capsule by mouth daily.     Turmeric 500 MG CAPS Take 1,000 mg by mouth daily.     vitamin C (  ASCORBIC ACID) 500 MG tablet Take 500 mg by mouth daily.     diphenhydrAMINE (BENADRYL) 25 MG tablet Take 50 mg by mouth daily as needed (swelling).     ibuprofen  (ADVIL ) 600 MG tablet Take 1 tablet (600 mg total) by mouth every 6 (six) hours as needed for mild pain (pain score 1-3). 30 tablet 0   oxyCODONE  (OXY IR/ROXICODONE ) 5 MG immediate release tablet Take 1 tablet (5 mg total) by mouth every 6 (six) hours as needed for severe pain (pain score 7-10). 8 tablet 0   trolamine salicylate (ASPERCREME) 10 % cream Apply 1 Application topically as needed for muscle pain.      Allergies:  Allergies  Allergen Reactions   Penicillins Rash   Atorvastatin     muscle aches   Influenza Vac Recombinant Ha Trivalent [Influenza Vaccine Recombinant]     severe reaction in 1970s--skin pealed   Rosuvastatin     muslce aches    Family History  Problem Relation Age of Onset   Heart failure Mother    Breast cancer Neg Hx      Social History:  reports that she has never smoked. She has never used smokeless tobacco. She reports that she does not drink alcohol and does not use drugs.   ROS: As per HPI, all others negative   Blood pressure (!) 157/57, pulse 60, temperature 98.2 F (36.8 C), temperature source Temporal, resp. rate 18, height 5\' 6"  (1.676 m), weight (!) 140.6 kg, SpO2 100%. General appearance: NAD HEENT:  [place]/AT, anicteric NECK:  Supple CV:  Regular LUNGS:  No visible distress ABD:  Soft, non-tender NEURO:  No encephalopathy  No results found for this or any previous visit (from the past 48 hours). No results found.  Assessment/Plan   Anemia. Endoscopy. Risks (bleeding, infection, bowel perforation that could require surgery, sedation-related changes in cardiopulmonary systems), benefits (identification and possible treatment of source of symptoms, exclusion of certain causes of symptoms), and alternatives (watchful waiting, radiographic imaging studies, empiric medical treatment) of upper endoscopy (EGD) were explained to patient/family in detail and patient wishes to proceed.   Yves Herb 03/16/2024, 9:15 AM

## 2024-03-16 NOTE — Op Note (Signed)
 [facility] Patient Name: Cassidy Austin Procedure Date: 03/16/2024 MRN: [id number] Attending MD: Evangeline Hilts , MD, [id number] Date of Birth: Mar 10, 1951 CSN: [id number] Age: 73 Admit Type: Outpatient Procedure:                Upper GI endoscopy Indications:              Iron deficiency anemia secondary to chronic blood                            loss Providers:                Evangeline Hilts, MD, Lonzell Robin, RN, Kimberly Penna                            [place], Technician Referring MD:              Medicines:                Monitored Anesthesia Care Complications:            No immediate complications. Estimated Blood Loss:     Estimated blood loss: none. Procedure:                Pre-Anesthesia Assessment:                           - Prior to the procedure, a History and Physical                            was performed, and patient medications and                            allergies were reviewed. The patient's tolerance of                            previous anesthesia was also reviewed. The risks                            and benefits of the procedure and the sedation                            options and risks were discussed with the patient.                            All questions were answered, and informed consent                            was obtained. Prior Anticoagulants: The patient has                            taken no anticoagulant or antiplatelet agents. ASA                            Grade Assessment: III - A patient with severe                            systemic  disease. After reviewing the risks and                            benefits, the patient was deemed in satisfactory                            condition to undergo the procedure.                           After obtaining informed consent, the endoscope was                            passed under direct vision. Throughout the                            procedure, the patient's  blood pressure, pulse, and                            oxygen saturations were monitored continuously. The                            [id number]-H190 ([id number]) Olympus endoscope was introduced                            through the mouth, and advanced to the second part                            of duodenum. The upper GI endoscopy was                            accomplished without difficulty. The patient                            tolerated the procedure well. Scope In: Scope Out: Findings:      The examined esophagus was normal.      Two 5 to 8 mm mucosal papules (nodules) with friability were seen in the       gastric antrum. Biopsies were taken with a cold forceps for histology.      The exam of the stomach was otherwise normal.      The duodenal bulb, first portion of the duodenum and second portion of       the duodenum were normal. Impression:               - Normal esophagus.                           - Two mucosal papules (nodules) found in the                            stomach. Friable. Biopsied. Could contribute to                            anemia.                           -  Normal duodenal bulb, first portion of the                            duodenum and second portion of the duodenum. Moderate Sedation:      Not Applicable - Patient had care per Anesthesia. Recommendation:           - Patient has a contact number available for                            emergencies. The signs and symptoms of potential                            delayed complications were discussed with the                            patient. Return to normal activities tomorrow.                            Written discharge instructions were provided to the                            patient.                           - Discharge patient to home (ambulatory).                           - Resume previous diet today.                           - Continue present medications.                           - Await  pathology results. Pending pathology                            results could consider endoscopic polypectomies.                           - Return to GI clinic after studies are complete.                           - Return to referring physician as previously                            scheduled. Procedure Code(s):        --- Professional ---                           [phone], Esophagogastroduodenoscopy, flexible,                            transoral; with biopsy, single or multiple Diagnosis Code(s):        --- Professional ---  K31.89, Other diseases of stomach and duodenum                           D50.0, Iron deficiency anemia secondary to blood                            loss (chronic) CPT copyright 2022 [employer]. All rights reserved. The codes documented in this report are preliminary and upon coder review may  be revised to meet current compliance requirements. Evangeline Hilts, MD 03/16/2024 9:45:21 AM This report has been signed electronically. Number of Addenda: 0

## 2024-03-17 LAB — SURGICAL PATHOLOGY

## 2024-03-17 NOTE — Anesthesia Postprocedure Evaluation (Signed)
 Anesthesia Post Note  Patient: Cassidy Austin  Procedure(s) Performed: EGD (ESOPHAGOGASTRODUODENOSCOPY) (Bilateral) BIOPSY, SKIN, SUBCUTANEOUS TISSUE, OR MUCOUS MEMBRANE     Patient location during evaluation: PACU Anesthesia Type: MAC Level of consciousness: awake and alert Pain management: pain level controlled Vital Signs Assessment: post-procedure vital signs reviewed and stable Respiratory status: spontaneous breathing, nonlabored ventilation, respiratory function stable and patient connected to nasal cannula oxygen Cardiovascular status: stable and blood pressure returned to baseline Postop Assessment: no apparent nausea or vomiting Anesthetic complications: no   No notable events documented.  Last Vitals:  Vitals:   03/16/24 0950 03/16/24 1000  BP: (!) 157/53 (!) 159/56  Pulse: (!) 58 60  Resp: 19 19  Temp:    SpO2: 98% 99%    Last Pain:  Vitals:   03/16/24 1000  TempSrc:   PainSc: 0-No pain                 Tamon Parkerson S

## 2024-03-18 ENCOUNTER — Encounter (HOSPITAL_COMMUNITY): Payer: Self-pay | Admitting: Gastroenterology

## 2024-04-08 DIAGNOSIS — S81801A Unspecified open wound, right lower leg, initial encounter: Secondary | ICD-10-CM | POA: Diagnosis not present

## 2024-04-08 DIAGNOSIS — S81802A Unspecified open wound, left lower leg, initial encounter: Secondary | ICD-10-CM | POA: Diagnosis not present

## 2024-04-15 DIAGNOSIS — I87313 Chronic venous hypertension (idiopathic) with ulcer of bilateral lower extremity: Secondary | ICD-10-CM | POA: Diagnosis not present

## 2024-04-15 DIAGNOSIS — L97822 Non-pressure chronic ulcer of other part of left lower leg with fat layer exposed: Secondary | ICD-10-CM | POA: Diagnosis not present

## 2024-04-15 DIAGNOSIS — I890 Lymphedema, not elsewhere classified: Secondary | ICD-10-CM | POA: Diagnosis not present

## 2024-04-15 DIAGNOSIS — R600 Localized edema: Secondary | ICD-10-CM | POA: Diagnosis not present

## 2024-04-15 DIAGNOSIS — L989 Disorder of the skin and subcutaneous tissue, unspecified: Secondary | ICD-10-CM | POA: Diagnosis not present

## 2024-04-15 DIAGNOSIS — Z6841 Body Mass Index (BMI) 40.0 and over, adult: Secondary | ICD-10-CM | POA: Diagnosis not present

## 2024-04-15 DIAGNOSIS — R5381 Other malaise: Secondary | ICD-10-CM | POA: Diagnosis not present

## 2024-04-15 DIAGNOSIS — I83018 Varicose veins of right lower extremity with ulcer other part of lower leg: Secondary | ICD-10-CM | POA: Diagnosis not present

## 2024-04-15 DIAGNOSIS — I2510 Atherosclerotic heart disease of native coronary artery without angina pectoris: Secondary | ICD-10-CM | POA: Diagnosis not present

## 2024-04-15 DIAGNOSIS — I872 Venous insufficiency (chronic) (peripheral): Secondary | ICD-10-CM | POA: Diagnosis not present

## 2024-04-15 DIAGNOSIS — I83028 Varicose veins of left lower extremity with ulcer other part of lower leg: Secondary | ICD-10-CM | POA: Diagnosis not present

## 2024-04-15 DIAGNOSIS — L97812 Non-pressure chronic ulcer of other part of right lower leg with fat layer exposed: Secondary | ICD-10-CM | POA: Diagnosis not present

## 2024-04-16 DIAGNOSIS — I87311 Chronic venous hypertension (idiopathic) with ulcer of right lower extremity: Secondary | ICD-10-CM | POA: Diagnosis not present

## 2024-04-30 DIAGNOSIS — Z6841 Body Mass Index (BMI) 40.0 and over, adult: Secondary | ICD-10-CM | POA: Diagnosis not present

## 2024-04-30 DIAGNOSIS — I87313 Chronic venous hypertension (idiopathic) with ulcer of bilateral lower extremity: Secondary | ICD-10-CM | POA: Diagnosis not present

## 2024-04-30 DIAGNOSIS — I890 Lymphedema, not elsewhere classified: Secondary | ICD-10-CM | POA: Diagnosis not present

## 2024-04-30 DIAGNOSIS — I83018 Varicose veins of right lower extremity with ulcer other part of lower leg: Secondary | ICD-10-CM | POA: Diagnosis not present

## 2024-04-30 DIAGNOSIS — R600 Localized edema: Secondary | ICD-10-CM | POA: Diagnosis not present

## 2024-04-30 DIAGNOSIS — L97812 Non-pressure chronic ulcer of other part of right lower leg with fat layer exposed: Secondary | ICD-10-CM | POA: Diagnosis not present

## 2024-04-30 DIAGNOSIS — I872 Venous insufficiency (chronic) (peripheral): Secondary | ICD-10-CM | POA: Diagnosis not present

## 2024-04-30 DIAGNOSIS — R5381 Other malaise: Secondary | ICD-10-CM | POA: Diagnosis not present

## 2024-04-30 DIAGNOSIS — L97822 Non-pressure chronic ulcer of other part of left lower leg with fat layer exposed: Secondary | ICD-10-CM | POA: Diagnosis not present

## 2024-04-30 DIAGNOSIS — L989 Disorder of the skin and subcutaneous tissue, unspecified: Secondary | ICD-10-CM | POA: Diagnosis not present

## 2024-04-30 DIAGNOSIS — I2510 Atherosclerotic heart disease of native coronary artery without angina pectoris: Secondary | ICD-10-CM | POA: Diagnosis not present

## 2024-04-30 DIAGNOSIS — I83028 Varicose veins of left lower extremity with ulcer other part of lower leg: Secondary | ICD-10-CM | POA: Diagnosis not present

## 2024-05-10 DIAGNOSIS — I890 Lymphedema, not elsewhere classified: Secondary | ICD-10-CM | POA: Diagnosis not present

## 2024-05-10 DIAGNOSIS — R5381 Other malaise: Secondary | ICD-10-CM | POA: Diagnosis not present

## 2024-05-10 DIAGNOSIS — L97822 Non-pressure chronic ulcer of other part of left lower leg with fat layer exposed: Secondary | ICD-10-CM | POA: Diagnosis not present

## 2024-05-10 DIAGNOSIS — I872 Venous insufficiency (chronic) (peripheral): Secondary | ICD-10-CM | POA: Diagnosis not present

## 2024-05-10 DIAGNOSIS — L97812 Non-pressure chronic ulcer of other part of right lower leg with fat layer exposed: Secondary | ICD-10-CM | POA: Diagnosis not present

## 2024-05-17 DIAGNOSIS — I872 Venous insufficiency (chronic) (peripheral): Secondary | ICD-10-CM | POA: Diagnosis not present

## 2024-05-17 DIAGNOSIS — I890 Lymphedema, not elsewhere classified: Secondary | ICD-10-CM | POA: Diagnosis not present

## 2024-05-17 DIAGNOSIS — I878 Other specified disorders of veins: Secondary | ICD-10-CM | POA: Diagnosis not present

## 2024-05-17 DIAGNOSIS — L97822 Non-pressure chronic ulcer of other part of left lower leg with fat layer exposed: Secondary | ICD-10-CM | POA: Diagnosis not present

## 2024-05-17 DIAGNOSIS — I87311 Chronic venous hypertension (idiopathic) with ulcer of right lower extremity: Secondary | ICD-10-CM | POA: Diagnosis not present

## 2024-05-17 DIAGNOSIS — L97812 Non-pressure chronic ulcer of other part of right lower leg with fat layer exposed: Secondary | ICD-10-CM | POA: Diagnosis not present

## 2024-05-24 DIAGNOSIS — R5381 Other malaise: Secondary | ICD-10-CM | POA: Diagnosis not present

## 2024-05-24 DIAGNOSIS — I890 Lymphedema, not elsewhere classified: Secondary | ICD-10-CM | POA: Diagnosis not present

## 2024-05-24 DIAGNOSIS — I878 Other specified disorders of veins: Secondary | ICD-10-CM | POA: Diagnosis not present

## 2024-05-31 DIAGNOSIS — I83028 Varicose veins of left lower extremity with ulcer other part of lower leg: Secondary | ICD-10-CM | POA: Diagnosis not present

## 2024-05-31 DIAGNOSIS — L97812 Non-pressure chronic ulcer of other part of right lower leg with fat layer exposed: Secondary | ICD-10-CM | POA: Diagnosis not present

## 2024-05-31 DIAGNOSIS — L97822 Non-pressure chronic ulcer of other part of left lower leg with fat layer exposed: Secondary | ICD-10-CM | POA: Diagnosis not present

## 2024-05-31 DIAGNOSIS — I872 Venous insufficiency (chronic) (peripheral): Secondary | ICD-10-CM | POA: Diagnosis not present

## 2024-05-31 DIAGNOSIS — I890 Lymphedema, not elsewhere classified: Secondary | ICD-10-CM | POA: Diagnosis not present

## 2024-05-31 DIAGNOSIS — I83018 Varicose veins of right lower extremity with ulcer other part of lower leg: Secondary | ICD-10-CM | POA: Diagnosis not present

## 2024-06-07 DIAGNOSIS — I872 Venous insufficiency (chronic) (peripheral): Secondary | ICD-10-CM | POA: Diagnosis not present

## 2024-06-07 DIAGNOSIS — L97812 Non-pressure chronic ulcer of other part of right lower leg with fat layer exposed: Secondary | ICD-10-CM | POA: Diagnosis not present

## 2024-06-07 DIAGNOSIS — L97811 Non-pressure chronic ulcer of other part of right lower leg limited to breakdown of skin: Secondary | ICD-10-CM | POA: Diagnosis not present

## 2024-06-07 DIAGNOSIS — I890 Lymphedema, not elsewhere classified: Secondary | ICD-10-CM | POA: Diagnosis not present

## 2024-06-07 DIAGNOSIS — I83018 Varicose veins of right lower extremity with ulcer other part of lower leg: Secondary | ICD-10-CM | POA: Diagnosis not present

## 2024-06-14 DIAGNOSIS — L97812 Non-pressure chronic ulcer of other part of right lower leg with fat layer exposed: Secondary | ICD-10-CM | POA: Diagnosis not present

## 2024-06-14 DIAGNOSIS — I890 Lymphedema, not elsewhere classified: Secondary | ICD-10-CM | POA: Diagnosis not present

## 2024-06-14 DIAGNOSIS — I83018 Varicose veins of right lower extremity with ulcer other part of lower leg: Secondary | ICD-10-CM | POA: Diagnosis not present

## 2024-06-14 DIAGNOSIS — L97811 Non-pressure chronic ulcer of other part of right lower leg limited to breakdown of skin: Secondary | ICD-10-CM | POA: Diagnosis not present

## 2024-06-14 DIAGNOSIS — I872 Venous insufficiency (chronic) (peripheral): Secondary | ICD-10-CM | POA: Diagnosis not present

## 2024-06-21 DIAGNOSIS — I83018 Varicose veins of right lower extremity with ulcer other part of lower leg: Secondary | ICD-10-CM | POA: Diagnosis not present

## 2024-06-21 DIAGNOSIS — I872 Venous insufficiency (chronic) (peripheral): Secondary | ICD-10-CM | POA: Diagnosis not present

## 2024-06-21 DIAGNOSIS — L97812 Non-pressure chronic ulcer of other part of right lower leg with fat layer exposed: Secondary | ICD-10-CM | POA: Diagnosis not present

## 2024-06-21 DIAGNOSIS — L97811 Non-pressure chronic ulcer of other part of right lower leg limited to breakdown of skin: Secondary | ICD-10-CM | POA: Diagnosis not present

## 2024-06-21 DIAGNOSIS — I890 Lymphedema, not elsewhere classified: Secondary | ICD-10-CM | POA: Diagnosis not present

## 2024-06-28 DIAGNOSIS — L97811 Non-pressure chronic ulcer of other part of right lower leg limited to breakdown of skin: Secondary | ICD-10-CM | POA: Diagnosis not present

## 2024-06-28 DIAGNOSIS — I890 Lymphedema, not elsewhere classified: Secondary | ICD-10-CM | POA: Diagnosis not present

## 2024-06-28 DIAGNOSIS — I872 Venous insufficiency (chronic) (peripheral): Secondary | ICD-10-CM | POA: Diagnosis not present

## 2024-06-30 DIAGNOSIS — I890 Lymphedema, not elsewhere classified: Secondary | ICD-10-CM | POA: Diagnosis not present

## 2024-06-30 DIAGNOSIS — I83018 Varicose veins of right lower extremity with ulcer other part of lower leg: Secondary | ICD-10-CM | POA: Diagnosis not present

## 2024-06-30 DIAGNOSIS — L97812 Non-pressure chronic ulcer of other part of right lower leg with fat layer exposed: Secondary | ICD-10-CM | POA: Diagnosis not present

## 2024-07-15 ENCOUNTER — Other Ambulatory Visit: Payer: Medicare PPO

## 2024-08-13 DIAGNOSIS — D509 Iron deficiency anemia, unspecified: Secondary | ICD-10-CM | POA: Diagnosis not present

## 2024-08-13 DIAGNOSIS — I10 Essential (primary) hypertension: Secondary | ICD-10-CM | POA: Diagnosis not present

## 2024-08-13 DIAGNOSIS — E039 Hypothyroidism, unspecified: Secondary | ICD-10-CM | POA: Diagnosis not present

## 2024-08-13 DIAGNOSIS — G720 Drug-induced myopathy: Secondary | ICD-10-CM | POA: Diagnosis not present

## 2024-08-13 DIAGNOSIS — M25512 Pain in left shoulder: Secondary | ICD-10-CM | POA: Diagnosis not present

## 2024-08-13 DIAGNOSIS — E785 Hyperlipidemia, unspecified: Secondary | ICD-10-CM | POA: Diagnosis not present
